# Patient Record
Sex: Female | Born: 1955 | Race: White | Hispanic: No | Smoking: Former smoker
Health system: Southern US, Community
[De-identification: ages and names within clinical notes are randomized; demographics above are authoritative.]

## PROBLEM LIST (undated history)

## (undated) DIAGNOSIS — N920 Excessive and frequent menstruation with regular cycle: Secondary | ICD-10-CM

## (undated) DIAGNOSIS — F32A Depression, unspecified: Secondary | ICD-10-CM

## (undated) DIAGNOSIS — K219 Gastro-esophageal reflux disease without esophagitis: Secondary | ICD-10-CM

## (undated) DIAGNOSIS — I10 Essential (primary) hypertension: Secondary | ICD-10-CM

## (undated) DIAGNOSIS — K7460 Unspecified cirrhosis of liver: Secondary | ICD-10-CM

## (undated) DIAGNOSIS — F329 Major depressive disorder, single episode, unspecified: Secondary | ICD-10-CM

## (undated) DIAGNOSIS — B1920 Unspecified viral hepatitis C without hepatic coma: Secondary | ICD-10-CM

## (undated) DIAGNOSIS — F1020 Alcohol dependence, uncomplicated: Secondary | ICD-10-CM

## (undated) HISTORY — DX: Alcohol dependence, uncomplicated: F10.20

## (undated) HISTORY — DX: Unspecified cirrhosis of liver: K74.60

## (undated) HISTORY — DX: Gastro-esophageal reflux disease without esophagitis: K21.9

---
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
## 2006-11-08 ENCOUNTER — Ambulatory Visit: Payer: Self-pay | Admitting: Internal Medicine

## 2006-11-08 DIAGNOSIS — N920 Excessive and frequent menstruation with regular cycle: Secondary | ICD-10-CM

## 2006-11-08 HISTORY — DX: Excessive and frequent menstruation with regular cycle: N92.0

## 2006-11-14 ENCOUNTER — Ambulatory Visit: Payer: Self-pay | Admitting: Gynecology

## 2006-11-14 ENCOUNTER — Encounter (INDEPENDENT_AMBULATORY_CARE_PROVIDER_SITE_OTHER): Payer: Self-pay | Admitting: Internal Medicine

## 2006-11-17 ENCOUNTER — Ambulatory Visit: Payer: Self-pay | Admitting: Obstetrics & Gynecology

## 2006-11-21 ENCOUNTER — Encounter: Admission: RE | Admit: 2006-11-21 | Discharge: 2006-11-21 | Payer: Self-pay | Admitting: Obstetrics & Gynecology

## 2006-12-07 ENCOUNTER — Encounter (INDEPENDENT_AMBULATORY_CARE_PROVIDER_SITE_OTHER): Payer: Self-pay | Admitting: Internal Medicine

## 2006-12-08 ENCOUNTER — Ambulatory Visit: Payer: Self-pay | Admitting: Obstetrics & Gynecology

## 2006-12-20 ENCOUNTER — Ambulatory Visit: Payer: Self-pay | Admitting: Family Medicine

## 2006-12-20 DIAGNOSIS — D696 Thrombocytopenia, unspecified: Secondary | ICD-10-CM

## 2006-12-22 ENCOUNTER — Ambulatory Visit: Payer: Self-pay | Admitting: Obstetrics & Gynecology

## 2006-12-22 LAB — CONVERTED CEMR LAB
Basophils Absolute: 0 10*3/uL (ref 0.0–0.1)
Basophils Relative: 0.4 % (ref 0.0–1.0)
Eosinophils Absolute: 0.1 10*3/uL (ref 0.0–0.6)
Eosinophils Relative: 1.9 % (ref 0.0–5.0)
Lymphocytes Relative: 40.1 % (ref 12.0–46.0)
Monocytes Absolute: 0.6 10*3/uL (ref 0.2–0.7)
Monocytes Relative: 9.7 % (ref 3.0–11.0)
Neutro Abs: 2.8 10*3/uL (ref 1.4–7.7)
Platelets: 90 10*3/uL — ABNORMAL LOW (ref 150–400)
RBC: 3.81 M/uL — ABNORMAL LOW (ref 3.87–5.11)
WBC: 5.8 10*3/uL (ref 4.5–10.5)

## 2006-12-23 ENCOUNTER — Ambulatory Visit: Payer: Self-pay | Admitting: Family Medicine

## 2006-12-23 DIAGNOSIS — R945 Abnormal results of liver function studies: Secondary | ICD-10-CM

## 2007-01-03 ENCOUNTER — Encounter (INDEPENDENT_AMBULATORY_CARE_PROVIDER_SITE_OTHER): Payer: Self-pay | Admitting: Internal Medicine

## 2007-01-03 LAB — CONVERTED CEMR LAB
ALT: 305 units/L — ABNORMAL HIGH (ref 0–35)
Albumin: 3.8 g/dL (ref 3.5–5.2)
Alkaline Phosphatase: 78 units/L (ref 39–117)
Total Protein: 6.8 g/dL (ref 6.0–8.3)

## 2008-08-20 ENCOUNTER — Emergency Department: Payer: Self-pay | Admitting: Internal Medicine

## 2008-11-27 ENCOUNTER — Emergency Department (HOSPITAL_COMMUNITY): Admission: EM | Admit: 2008-11-27 | Discharge: 2008-11-27 | Payer: Self-pay | Admitting: Emergency Medicine

## 2009-03-01 DIAGNOSIS — B1920 Unspecified viral hepatitis C without hepatic coma: Secondary | ICD-10-CM

## 2009-03-01 HISTORY — DX: Unspecified viral hepatitis C without hepatic coma: B19.20

## 2009-04-16 ENCOUNTER — Inpatient Hospital Stay: Payer: Self-pay | Admitting: Internal Medicine

## 2009-06-09 ENCOUNTER — Emergency Department: Payer: Self-pay | Admitting: Unknown Physician Specialty

## 2009-11-01 ENCOUNTER — Emergency Department: Payer: Self-pay | Admitting: Internal Medicine

## 2009-12-18 ENCOUNTER — Observation Stay: Payer: Self-pay | Admitting: Gastroenterology

## 2009-12-30 ENCOUNTER — Ambulatory Visit: Payer: Self-pay | Admitting: Internal Medicine

## 2010-01-06 ENCOUNTER — Inpatient Hospital Stay: Payer: Self-pay | Admitting: Gastroenterology

## 2010-01-29 ENCOUNTER — Ambulatory Visit: Payer: Self-pay | Admitting: Internal Medicine

## 2010-07-14 NOTE — Assessment & Plan Note (Signed)
NAMEMAGGI, HERSHKOWITZ                ACCOUNT NO.:  [id number]   MEDICAL RECORD NO.:  [id number]          PATIENT TYPE:  POB   LOCATION:  [facility] at [facility]         FACILITY:  [facility]   PHYSICIAN:  Elsie Lincoln, MD      DATE OF BIRTH:  08-23-1955   DATE OF SERVICE:  11/17/2006                                  CLINIC NOTE   The patient is a 55 year old female who presents for endometrial biopsy.  The patient was seen 3 days earlier.  Of note, she is not anemic but she  is known to have thrombocytopenia of a __________  with 107.  We are  going to repeat the CBC today to make sure that this is not a lab error.  If she is thrombocytopenic, I am going referral her back to [place] to  Dr. __________  so he can workup her thrombocytopenia.  We did her  endometrial biopsy today without problem, after her UCG is negative.  Her uterus sounded to approximately 10 cm.  Her TSH, also of note, is  negative.  She has a transvaginal ultrasound next week and also a  mammogram next week.  She will come back early October to review results  and plan of care.           ______________________________  Elsie Lincoln, MD     KL/MEDQ  D:  11/17/2006  T:  11/17/2006  Job:  [id number]

## 2010-07-14 NOTE — Assessment & Plan Note (Signed)
Emily Butler, Emily Butler                ACCOUNT NO.:  [id number]   MEDICAL RECORD NO.:  [id number]          PATIENT TYPE:  POB   LOCATION:  [facility] at [facility]         FACILITY:  [facility]   PHYSICIAN:  Elsie Lincoln, MD      DATE OF BIRTH:  07/24/1955   DATE OF SERVICE:  12/22/2006                                  CLINIC NOTE   The patient is a 55 year old female who presents for IUD insertion,  supposed to come last week; however, she was having what probably was  alcohol withdrawal symptoms, as she was very shaky, she was supposed to  go the emergency room, but she did not.  She states that she quit taking  the Antabuse and Wellbutrin, because it seems that it was making her  feel worse, but she swears she has not had a drink in 2 weeks and feels  much better.  Her outlook is much better; she does not feel suicidal,  and she feels that she is through the worst of it.  She has not gone  through an [employer]; however, she does know where they are, and she  intends to go.  I did do some labs and her PT was 16.2, which is high;  her PTT was normal; her AST was 229; her ALT was 208.  The patient was  sent to primary care doctor and they are sending her probably to a  hematologist.  She has consented for the IUD, and the UPT was negative.  Her uterus sounded to 8 cm; however, when we tried to get the device in,  I can only get it to 6 cm and it would not launch.  The patient felt  like it was coming out her lungs, so we aborted the procedure.  We  talked about the __________ ; this may help some of her menorrhagia;  however, she does have a hemoglobin of 13.9.  I figure as soon as she  gets her coagulopathy corrected, she probably will not problems with  menorrhagia anymore.  However, with the __________ would be a good  bridge.  The patient will come back in 1 week for an __________  insertion.           ______________________________  Elsie Lincoln, MD     KL/MEDQ  D:  12/22/2006  T:   12/23/2006  Job:  [id number]

## 2010-07-14 NOTE — Assessment & Plan Note (Signed)
Emily Butler, Emily Butler                ACCOUNT NO.:  [id number]   MEDICAL RECORD NO.:  [id number]          PATIENT TYPE:  POB   LOCATION:  [facility] at [facility]         FACILITY:  [facility]   PHYSICIAN:  Elsie Lincoln, MD      DATE OF BIRTH:  Aug 05, 1955   DATE OF SERVICE:  12/08/2006                                  CLINIC NOTE   The patient is a  55 year old female who presents for follow-up of  abnormal uterine bleeding. The patient had endometrial biopsy which  showed proliferative endometrium, which is interesting because she did  go through a time period where she had no menses for about a the year.  However, it does not appear that she is in menopause now. She is 55  years old and most likely will not have many more years before menopause  and is upon her.  She did have an ultrasound which showed a uterus that  measures 10.3 x 5.3 x 6.6  with an endometrial stripe of  0.9 cm.  This  is not enlarged.  There was a shadowing in the uterine fundus, which is  most likely calcified fibroid. The right ovary showed a right simple  cyst and  a hypoechoic  nodule on the left ovary which is most likely a  hemorrhagic cyst.  It measures approximately 2.7 cm.  We will follow  this up with another ultrasound in 8 weeks.  We did start speaking in  detail once I told her that her platelets were abnormal and she could be  possibly due to her drinking and she opened up and let me know that she  is an alcoholic and has suicidal thoughts and was having that something  could be wrong with her that could wrong with her that could possibly  lead to her death. She had been on Zoloft in the past, which she thought  made her have suicidal thoughts so I do not think that this would be a  good option for her. She was drinking a lot of alcohol prior to her  children had come to live with her and now she is down to three to four  drinks a day, which I still think is a level of alcoholism. She can go  out to up a week  without drinking.  However, it does sound like she  still drinks every day.  We will check coags and liver function to see  if she is having any problems with end organ damage from her alcoholism.  The patient is requesting Antabuse to help her quit drinking, although I  am not very familiar with this drug and  she understands this.  I will  have consulted the pharmacy at [facility] and we will prescribe her  this. She agrees to go to Alcoholics Anonymous and take her  antidepressant. She is not suicidal at this point.  I do believe she  needs a psychiatrist as this is coming out of the realm of a  gynecologist.  I do feel like we need to help her today as she has  reached out to her primary care physician in the past and he  has refused  to help her.  She does need a sleep aid and we will prescribe Ambien for  that.  I did prescribe her narcotic in the past, but she did not ask a  refill of that and that is refreshing.  Her platelet count was down to  95, so we are drawing coags  and liver function tests day and will refer  her back to [place] to have her thrombocytopenia worked up more  thoroughly by a medicine doctor.  Good news is that her mammogram is  normal.  As noted above, we will follow up her ultrasound in 8 weeks to  make sure that hemorrhagic cyst has resolved and that it is not  neoplasm.  We will also put in the Mirena IUD to help her vaginal  bleeding.  We discussed OCPs and Depo; however, due to her migraine  headaches, she does not want to try either of these. Other surgical  options were discussed including endometrial ablation and hysterectomy.  However, we will start with simple measures with the least risk which is  the IUD insertion.  The patient to come back in one month for IUD  insertion.  The patient is not sexually active so pregnancy is not an  issue at this point. Again, if the patient has any suicidal ideation,  she is supposed to go to the emergency room  immediately          ______________________________  Elsie Lincoln, MD    KL/MEDQ  D:  12/08/2006  T:  12/08/2006  Job:  [id number]

## 2010-07-14 NOTE — Assessment & Plan Note (Signed)
Emily Butler, Emily Butler                ACCOUNT NO.:  [id number]   MEDICAL RECORD NO.:  [id number]          PATIENT TYPE:  POB   LOCATION:  [facility] at [facility]         FACILITY:  [facility]   PHYSICIAN:  Elsie Lincoln, MD      DATE OF BIRTH:  Jul 20, 1955   DATE OF SERVICE:  11/14/2006                                  CLINIC NOTE   REASON FOR VISIT:  The patient is 55 year old, P3 female who was sent to  [place] for irregular bleeding that started about 3 years ago when she saw a  gynecologist in [place] who did an endometrial biopsy which was negative  and then eventually went away by itself and she had no menses for a  year.  After that, the irregular menses, pain and heavy bleeding  returned.  She has never had transvaginal ultrasound.  She does not know  if she has cysts or fibroids of her uterus.  She goes through several  pads and she takes a bath every night to soothe her back pain.  It has  not caused her to miss work, but she sometimes feels like she needs to.  It definitely interferes with her life.   PAST MEDICAL HISTORY:  Migraine headaches.   PAST SURGICAL HISTORY:  1. C-section x3 with the birth of twins with one pregnancy.  2. BTL.   MEDICATIONS:  Motrin and multivitamin.   ALLERGIES:  CODEINE which causes her to vomit.   FAMILY HISTORY:  Father has heart disease, heart attack and high blood  pressure.   PAST GYNECOLOGICAL HISTORY:  No history of abnormal Pap smears, ovarian  cysts or fibroid tumors of the uterus.   SOCIAL HISTORY:  She drinks two drinks 5 days for week.  Does not smoke.  She does drink three caffeinated beverages a day.   REVIEW OF SYSTEMS:  Positive for bruising, muscle aches, fatigue,  frequent headaches, vaginal bleeding and vaginal odor.   PHYSICAL EXAMINATION:  GENERAL:  Well-nourished, well-developed in no  apparent distress.  VITAL SIGNS:  Blood pressures 136/79, weight 166, pulse 70.  ABDOMEN:  Soft, nontender.  No rebound or guarding.  GENITALIA:   Tanner V.  Vagina is pink, normal rugae with small amount of  blood.  Cervix nulliparous appearing, small and mobile.  Uterus enlarged  about 14 weeks.  Adnexa with no masses, nontender.  Uterus is tender  when it is moved.  EXTREMITIES:  Nontender.   ASSESSMENT:  A 55 year old female with menorrhagia, pelvic pain and  enlarged uterus.   PLAN:  1. CBC to rule out anemia.  2. TSH.  I think this is the reason she has menorrhagia.  3. Transvaginal ultrasound.  4. Endometrial biopsy premedicated with Motrin.  5. The patient needs a screening mammogram.  She has never had one.  6. Return to clinic for the endometrial biopsy.  7. Vicodin for pain.           ______________________________  Elsie Lincoln, MD     KL/MEDQ  D:  11/14/2006  T:  11/15/2006  Job:  [id number]   cc:   /Dr. Jari Favre Court E

## 2010-08-11 ENCOUNTER — Other Ambulatory Visit: Payer: Self-pay | Admitting: Medical

## 2011-04-18 ENCOUNTER — Emergency Department: Payer: Self-pay | Admitting: Emergency Medicine

## 2011-04-18 LAB — COMPREHENSIVE METABOLIC PANEL
Albumin: 3.7 g/dL (ref 3.4–5.0)
Alkaline Phosphatase: 90 U/L (ref 50–136)
BUN: 38 mg/dL — ABNORMAL HIGH (ref 7–18)
Calcium, Total: 8.4 mg/dL — ABNORMAL LOW (ref 8.5–10.1)
Chloride: 113 mmol/L — ABNORMAL HIGH (ref 98–107)
Co2: 23 mmol/L (ref 21–32)
EGFR (African American): >60
Glucose: 92 mg/dL (ref 65–99)
SGOT(AST): 166 U/L — ABNORMAL HIGH (ref 15–37)
SGPT (ALT): 141 U/L — ABNORMAL HIGH
Sodium: 145 mmol/L (ref 136–145)
Total Protein: 5.9 g/dL — ABNORMAL LOW (ref 6.4–8.2)

## 2011-04-18 LAB — URINALYSIS, COMPLETE
Bilirubin,UR: NEGATIVE
Hyaline Cast: 11
Ketone: NEGATIVE
Ph: 5 (ref 4.5–8.0)
Protein: 100
RBC,UR: 84 /HPF (ref 0–5)
Specific Gravity: 1.016 (ref 1.003–1.030)
Squamous Epithelial: 1

## 2011-04-18 LAB — LIPASE, BLOOD: Lipase: 230 U/L (ref 73–393)

## 2011-04-18 LAB — CBC
MCH: 34.4 pg — ABNORMAL HIGH (ref 26.0–34.0)
MCHC: 34.7 g/dL (ref 32.0–36.0)
MCV: 99 fL (ref 80–100)
RBC: 3.05 10*6/uL — ABNORMAL LOW (ref 3.80–5.20)
RDW: 14.1 % (ref 11.5–14.5)

## 2011-12-08 ENCOUNTER — Emergency Department: Payer: Self-pay

## 2011-12-08 LAB — URINALYSIS, COMPLETE
Bacteria: NONE SEEN
Bilirubin,UR: NEGATIVE
Glucose,UR: NEGATIVE mg/dL (ref 0–75)
Ketone: NEGATIVE
Leukocyte Esterase: NEGATIVE
Nitrite: NEGATIVE
Ph: 5 (ref 4.5–8.0)
RBC,UR: 34 /HPF (ref 0–5)
Squamous Epithelial: NONE SEEN
WBC UR: <1 /HPF (ref 0–5)

## 2011-12-08 LAB — CBC
HCT: 34.4 % — ABNORMAL LOW (ref 35.0–47.0)
HGB: 12.5 g/dL (ref 12.0–16.0)
MCH: 34.3 pg — ABNORMAL HIGH (ref 26.0–34.0)
MCHC: 36.2 g/dL — ABNORMAL HIGH (ref 32.0–36.0)

## 2011-12-08 LAB — COMPREHENSIVE METABOLIC PANEL
Albumin: 4.4 g/dL (ref 3.4–5.0)
Alkaline Phosphatase: 118 U/L (ref 50–136)
Calcium, Total: 9.2 mg/dL (ref 8.5–10.1)
Glucose: 96 mg/dL (ref 65–99)
SGOT(AST): 275 U/L — ABNORMAL HIGH (ref 15–37)
SGPT (ALT): 242 U/L — ABNORMAL HIGH (ref 12–78)
Sodium: 136 mmol/L (ref 136–145)

## 2011-12-13 LAB — POTASSIUM: Potassium: 5.2 mmol/L — ABNORMAL HIGH (ref 3.5–5.1)

## 2012-02-03 ENCOUNTER — Inpatient Hospital Stay: Payer: Self-pay | Admitting: Family Medicine

## 2012-02-03 LAB — COMPREHENSIVE METABOLIC PANEL
Albumin: 3.7 g/dL (ref 3.4–5.0)
Alkaline Phosphatase: 124 U/L (ref 50–136)
BUN: 33 mg/dL — ABNORMAL HIGH (ref 7–18)
Bilirubin,Total: 0.9 mg/dL (ref 0.2–1.0)
Chloride: 112 mmol/L — ABNORMAL HIGH (ref 98–107)
EGFR (African American): 58 — ABNORMAL LOW
Glucose: 98 mg/dL (ref 65–99)
SGOT(AST): 88 U/L — ABNORMAL HIGH (ref 15–37)
SGPT (ALT): 57 U/L (ref 12–78)
Total Protein: 6.5 g/dL (ref 6.4–8.2)

## 2012-02-03 LAB — LIPASE, BLOOD: Lipase: 308 U/L (ref 73–393)

## 2012-02-03 LAB — APTT: Activated PTT: 27.5 secs (ref 23.6–35.9)

## 2012-02-03 LAB — CBC WITH DIFFERENTIAL/PLATELET
Basophil #: 0 10*3/uL (ref 0.0–0.1)
Basophil %: 1 %
Eosinophil #: 0 10*3/uL (ref 0.0–0.7)
Eosinophil %: 1.3 %
HCT: 27.3 % — ABNORMAL LOW (ref 35.0–47.0)
HGB: 9.6 g/dL — ABNORMAL LOW (ref 12.0–16.0)
Lymphocyte #: 0.5 10*3/uL — ABNORMAL LOW (ref 1.0–3.6)
Lymphocyte %: 15.3 %
MCH: 33.5 pg (ref 26.0–34.0)
MCHC: 35.3 g/dL (ref 32.0–36.0)
Monocyte #: 0.2 x10 3/mm (ref 0.2–0.9)
Neutrophil #: 2.6 10*3/uL (ref 1.4–6.5)
RBC: 2.87 10*6/uL — ABNORMAL LOW (ref 3.80–5.20)
RDW: 13.1 % (ref 11.5–14.5)

## 2012-02-03 LAB — URINALYSIS, COMPLETE
Bilirubin,UR: NEGATIVE
Glucose,UR: NEGATIVE mg/dL (ref 0–75)
Hyaline Cast: 42
Leukocyte Esterase: NEGATIVE
Nitrite: NEGATIVE
Ph: 5 (ref 4.5–8.0)
RBC,UR: 69 /HPF (ref 0–5)
Squamous Epithelial: <1

## 2012-02-03 LAB — PROTIME-INR: INR: 1.2

## 2012-02-04 LAB — CBC WITH DIFFERENTIAL/PLATELET
Basophil #: 0 10*3/uL (ref 0.0–0.1)
Basophil %: 0.6 %
Eosinophil #: 0.1 10*3/uL (ref 0.0–0.7)
Eosinophil %: 2.3 %
HCT: 24.4 % — ABNORMAL LOW (ref 35.0–47.0)
HGB: 8.5 g/dL — ABNORMAL LOW (ref 12.0–16.0)
Lymphocyte #: 0.5 10*3/uL — ABNORMAL LOW (ref 1.0–3.6)
MCH: 32.9 pg (ref 26.0–34.0)
MCHC: 34.8 g/dL (ref 32.0–36.0)
MCV: 95 fL (ref 80–100)
Monocyte %: 8.7 %
Neutrophil %: 65.4 %
Platelet: 48 10*3/uL — ABNORMAL LOW (ref 150–440)
RBC: 2.58 10*6/uL — ABNORMAL LOW (ref 3.80–5.20)

## 2012-02-04 LAB — LIPID PANEL
Cholesterol: 112 mg/dL (ref 0–200)
HDL Cholesterol: 35 mg/dL — ABNORMAL LOW (ref 40–60)
Triglycerides: 93 mg/dL (ref 0–200)
VLDL Cholesterol, Calc: 19 mg/dL (ref 5–40)

## 2012-02-04 LAB — BASIC METABOLIC PANEL
Anion Gap: 6 — ABNORMAL LOW (ref 7–16)
Co2: 23 mmol/L (ref 21–32)
Creatinine: 1.2 mg/dL (ref 0.60–1.30)
EGFR (African American): 59 — ABNORMAL LOW
EGFR (Non-African Amer.): 51 — ABNORMAL LOW
Potassium: 4.7 mmol/L (ref 3.5–5.1)
Sodium: 141 mmol/L (ref 136–145)

## 2012-02-09 LAB — CULTURE, BLOOD (SINGLE)

## 2012-07-07 DIAGNOSIS — Z87891 Personal history of nicotine dependence: Secondary | ICD-10-CM | POA: Insufficient documentation

## 2012-07-07 DIAGNOSIS — I10 Essential (primary) hypertension: Secondary | ICD-10-CM | POA: Insufficient documentation

## 2012-07-07 DIAGNOSIS — K7460 Unspecified cirrhosis of liver: Secondary | ICD-10-CM | POA: Insufficient documentation

## 2012-07-07 DIAGNOSIS — I4891 Unspecified atrial fibrillation: Secondary | ICD-10-CM | POA: Insufficient documentation

## 2012-07-07 DIAGNOSIS — B182 Chronic viral hepatitis C: Secondary | ICD-10-CM | POA: Insufficient documentation

## 2012-10-31 DIAGNOSIS — K429 Umbilical hernia without obstruction or gangrene: Secondary | ICD-10-CM | POA: Insufficient documentation

## 2013-05-23 DIAGNOSIS — G2581 Restless legs syndrome: Secondary | ICD-10-CM | POA: Insufficient documentation

## 2013-08-13 DIAGNOSIS — K219 Gastro-esophageal reflux disease without esophagitis: Secondary | ICD-10-CM | POA: Insufficient documentation

## 2013-08-13 DIAGNOSIS — N189 Chronic kidney disease, unspecified: Secondary | ICD-10-CM | POA: Insufficient documentation

## 2014-06-18 NOTE — Consult Note (Signed)
Brief Consult Note: Diagnosis: Abdominal pai, ascites and diarrhea.   Patient was seen by consultant.   Consult note dictated.   Comments: Patient with cirrhosis of liver along with h/o ETOH and hepatitis C admitted with abdominal pain which is intermittent for last several months. Also c/o diarrhea for 2 weeks. CT with ascites and cirrhosis. US not enough fluid to perform ascites. Abdomen is benign but with hyperactive bowel sounds. ? RLL pulmonary infiltrate.  Recommendations: Agree with Levaquin. Stool studies if diarrhea continues. Continue diuretics. Agree with Nadolol. Hopefully home soon on PO antibiotics with OP follow up with me.  Electronic Signatures: Lurline DelIftikhar, Yuuki Skeens (MD)  (Signed 05-Dec-13 17:52)  Authored: Brief Consult Note   Last Updated: 05-Dec-13 17:52 by Lurline DelIftikhar, Drea Jurewicz (MD)

## 2014-06-18 NOTE — Consult Note (Signed)
PATIENT NAME:  Emily Butler, Emily Butler MR#:  [id number] DATE OF BIRTH:  October 19, 1955  DATE OF CONSULTATION:  02/03/2012  REFERRING PHYSICIAN:   CONSULTING PHYSICIAN:  Lurline DelShaukat Jaime Grizzell, MD  REASON FOR CONSULTATION: Abdominal pain.   HISTORY OF PRESENT ILLNESS: This is a 59 year old female with history of hepatitis C, history of alcohol use in the past, cirrhosis of liver, and ascites. The patient has been having intermittent abdominal pain for the last several months, according to her. She was admitted again as she started to have severe abdominal pain that started yesterday. Sometimes it feels like gas pain, which is diffuse, and at other times it is mostly in the right upper to right lower quadrant area. She is also complaining of diarrhea for the last two weeks, about four to five loose bowel movements a day. No nausea or vomiting. No fever. CT scan of the abdomen and pelvis done in the Emergency Room was quite unremarkable, except for mild to moderate ascites and a cirrhotic appearing liver. The patient was admitted by Dr. Renae GlossWieting for further management. CT scan also showed questionable right lower lobe airspace disease raising concerns about possible pneumonia.   PAST MEDICAL HISTORY:  1. Hepatitis C and cirrhosis. 2. History of hemolytic anemia in the past. 3. Chronic ascites.  4. Atrial fibrillation.   PAST SURGICAL HISTORY: Cesarean section.   ALLERGIES: No known drug allergies.   HOME MEDICATIONS:  1. Atenolol 25 mg a day. 2. Lasix 40 mg a day. 3. Spironolactone 100 mg a day.   SOCIAL HISTORY: He does not smoke and quit drinking about three years ago.   FAMILY HISTORY: Unremarkable.   REVIEW OF SYSTEMS: Grossly unremarkable except for what is mentioned in the History of Present Illness.   PHYSICAL EXAMINATION:   GENERAL: Well built female. She does not appear to be in any acute distress. Her abdominal pain is much better now, according to her.  VITALS: Pulse 64, respirations 18, blood  pressure 138/80, and temperature 98.1. She is clinically not jaundiced.   NECK: Neck veins are flat.   PULMONARY: Lungs are clear to auscultation bilaterally with fair entry.   CARDIOVASCULAR: Regular rate and rhythm. No gallops or murmur.   ABDOMEN: Examination showed only mildly distended abdomen. No significant ascites was noted on physical examination. Bowel sounds were hyperactive. No significant hepatosplenomegaly was noted.   EXTREMITIES: No edema. No clubbing or cyanosis.   NEUROLOGIC: Examination appears to be normal and no signs of hepatic encephalopathy.   LABS/RADIOLOGIC STUDIES: Electrolytes are fine as well as BUN and creatinine. AST is 88. The rest of the liver enzymes are normal. Serum lipase is 308. White cell count is 3.3, hemoglobin 9.6, hematocrit 27.3, and platelet count 61. INR is 1.2.   Urinalysis showed 3+ blood, 3 WBCs, and 69 RBCs.  Ultrasound of the abdomen did not show enough ascites to perform paracentesis.   ASSESSMENT AND PLAN: The patient is with abdominal pain, which is intermittent, for the last several months. This may be secondary to ascites. SBP is another consideration, although the patient is afebrile and her white cell count is normal, although she does have ascites. Right lower lobe pneumonia is another consideration which can present with abdominal pain as well. The patient is also complaining of some diarrhea. She has no history of antibiotic use, although C. difficile colitis or other forms of colitis remains a possibility. The patient also has history of underlying cirrhosis secondary to combination of hepatitis C and alcohol. She has  not had any alcoholic beverage in the last three years, according to her. I would recommend treating her empirically with an antibiotic such as Levaquin, which will cover both SBP as well as suspected pneumonia. Levaquin may also be helpful in case she is having an infectious diarrhea. I would obtain stool studies  including fecal, WBC cultures, and C. difficile toxin. Agree with current diuretics and switching atenolol to nadolol to reduce the risk of portal hypertensive bleeding. Overall the patient's physical examination is quite benign, and if she does have SBP it appears to be mild. We will follow. The plan has been discussed with the patient.  ____________________________ Lurline Del, MD si:slb D: 02/03/2012 18:01:29 ET T: 02/04/2012 07:29:48 ET JOB#: [id number]  cc: Lurline Del, MD, <Dictator> Lurline Del MD ELECTRONICALLY SIGNED 02/07/2012 10:48

## 2014-06-18 NOTE — H&P (Signed)
PATIENT NAME:  Emily Butler, GILLSON MR#:  [id number] DATE OF BIRTH:  1955-08-16  DATE OF ADMISSION:  02/03/2012  PRIMARY CARE PHYSICIAN: [facility], Dr. Hessie Diener    CHIEF COMPLAINT: Abdominal pain.   HISTORY OF PRESENT ILLNESS: This is a 59 year old female with history of Hepatitis C, cirrhosis, and ascites. She presents with abdominal pain. She has had abdominal pain since last February but not as bad. Over the last few days she is having abdominal pain, stabbing, knifelike, dragging down her abdomen on the right side down into the pubic bone, 9 out of 10 in intensity. It sort of comes and goes, also has a gas-like pain also. She has also had worsening abdominal distention, some chills but no fever. She also feels like she has an upper respiratory tract infection with nose congestion and some ear pain on the left. Hospitalist services were contacted for further evaluation for suspicion of SBP. A CT scan of the abdomen and pelvis showed a cirrhotic liver, moderate amount of ascites, splenomegaly, and right lower lobe airspace disease, could be atelectasis versus pneumonia.   PAST MEDICAL HISTORY:  1. Hepatitis C. 2. Cirrhosis. 3. 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 4. Ascites. 5. Atrial fibrillation in the past.   PAST SURGICAL HISTORY: Cesarean section.   ALLERGIES: No known drug allergies.   MEDICATIONS:  1. Atenolol 25 mg daily.  2. Furosemide 40 mg daily.  3. Spironolactone 100 mg daily.   Not sure if she is taking them at this point.   SOCIAL HISTORY: No smoking. Quit alcohol three years ago. No drug use currently. Used to have IV drugs back in the 80's and also did have a transfusion back in 1984. She works light work in a bar.   FAMILY HISTORY: Father died of a heart issue. Mother died of alcohol and drugs and had leukemia.   REVIEW OF SYSTEMS: CONSTITUTIONAL: No fever. Positive for chills. Positive weight loss and weight gain. Weight fluctuates. Positive for weakness.  EYES: She does wear glasses and had some left eye blurring of vision. EARS, NOSE, MOUTH, AND THROAT: Positive for runny nose, sinus congestion, cold, sore throat. CARDIOVASCULAR: No chest pain. No palpitations. RESPIRATORY: No shortness of breath. Slight cough. No sputum. No hemoptysis. GASTROINTESTINAL: Positive for abdominal pain 9 out of 10 in intensity. Positive for diarrhea 4 to 5 times but not much coming out. No nausea. No vomiting. GENITOURINARY: Positive for blood in the urine. No hematuria. MUSCULOSKELETAL: Positive for joint pain. INTEGUMENTARY: Positive for rash on the back. MUSCULOSKELETAL: Positive for joint pain. NEUROLOGIC: No fainting or blackouts. PSYCHIATRIC: History of depression in the past. ENDOCRINE: No thyroid problems. HEMATOLOGIC/LYMPHATIC: Positive for anemia.   PHYSICAL EXAMINATION:   VITAL SIGNS: Temperature 98, pulse 62, respirations 18, blood pressure 164/68, pulse oximetry 98%.   GENERAL: No respiratory distress.   EYES: Conjunctivae and lids normal. Pupils equal, round, and reactive to light. Extraocular muscles intact. No nystagmus.   EARS, NOSE, MOUTH, AND THROAT: Tympanic membranes on the left erythematous. Tympanic membrane on the right negative. Nasal mucosa slight erythema. Throat slight erythema. No exudate seen. Lips and gums no lesions.   NECK: No JVD. No bruits. No lymphadenopathy. No thyromegaly. No thyroid nodules palpated.   RESPIRATORY: Decreased breath sounds bilateral bases. Scattered rhonchi. No wheeze.   CARDIOVASCULAR: S1, S2 normal. +2/6 systolic ejection murmur. Carotid upstroke 2+ bilaterally. No bruits. Dorsalis pedis pulses 2+ bilaterally. No edema of the lower extremity.   ABDOMEN: Soft. Positive tenderness more on the  right abdomen than the left side. Positive for hepatosplenomegaly. Positive umbilical hernia.   LYMPHATIC: No lymph nodes in the neck.   MUSCULOSKELETAL: No clubbing. No cyanosis. No edema.   SKIN: No ulcers seen.    NEUROLOGIC: Cranial nerves II through XII grossly intact. Deep tendon reflexes 2+ bilateral lower extremity.   PSYCHIATRIC: The patient is oriented to person, place, and time.   LABORATORY AND RADIOLOGICAL DATA: EKG sinus bradycardia, 55 beats per minute, no acute ST-T wave changes.   Chest x-ray showed minimal right basilar opacities, atelectasis versus early infection.   CT scan of the abdomen and pelvis showed cirrhotic liver with moderate amount of ascites, splenomegaly, right lower lobe hazy airspace disease which may be atelectasis versus pneumonia.   Urinalysis 3+ blood. White blood cell count 3.3, hemoglobin and hematocrit 9.6 and 27.3, platelet count 61, glucose 98, BUN 33, creatinine 1.22, sodium 140, potassium 4.5, chloride 112, CO2 21, calcium 8.7. Liver function tests AST slightly elevated at 88. GFR 50. Lipase 308. Troponin negative.   ASSESSMENT AND PLAN:  1. Abdominal pain, acute on chronic. Will rule out SBP with her ascites. A PT/INR was not drawn on presentation, awaiting that prior to paracentesis. Paracentesis labs ordered by ER physician. Will wait for paracentesis before starting IV Levaquin high dose.  2. Upper respiratory tract infection, questionable pneumonia. This could be secondary to the patient's ascites tracking up on the right lower lung, unclear if it is pneumonia but she does have an upper respiratory tract infection. Also, Levaquin will cover.  3. 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 Watch blood counts closely. Will get a paracentesis to rule out SBP. Continue Lasix and Aldactone for ascites. Will switch the patient's atenolol over to nadolol to lower portal pressures. Will get a GI consultation since the patient has not had an alcoholic beverage in three years and drugs in numerous years. Could be a candidate for a liver if referred to a tertiary care center but will have GI evaluate here first.  4. Chronic kidney disease. Continue  to watch GFR with diuresis. Last creatinine on record back in October was 1.37.   TIME SPENT ON ADMISSION: 55 minutes.   ____________________________ Herschell Dimesichard J. Renae GlossWieting, MD rjw:drc D: 02/03/2012 14:49:28 ET T: 02/03/2012 14:59:55 ET JOB#: [id number]  cc: Herschell Dimesichard J. Renae GlossWieting, MD, <Dictator> [facility] Salley ScarletICHARD J Peniel Hass MD ELECTRONICALLY SIGNED 02/03/2012 17:17

## 2014-06-18 NOTE — Consult Note (Signed)
Chief Complaint:   Subjective/Chief Complaint Feels well. No symptoms. No diarrhea.   VITAL SIGNS/ANCILLARY NOTES: **Vital Signs.:   06-Dec-13 13:11   Vital Signs Type Routine   Temperature Temperature (F) 97.6   Celsius 36.4   Temperature Source oral   Pulse Pulse 60   Respirations Respirations 18   Systolic BP Systolic BP 121   Diastolic BP (mmHg) Diastolic BP (mmHg) 71   Mean BP 87   Pulse Ox % Pulse Ox % 98   Pulse Ox Activity Level  At rest   Oxygen Delivery Room Air/ 21 %   Brief Assessment:   Additional Physical Exam Abdomen is soft and non tender. No significant ascites.   Assessment/Plan:  Assessment/Plan:   Assessment Cirhosis of liver, well compensated. Ascites. ? SBP. Patient is now asymptomatic. ? Pneumonia. Diarrhea, resolved.    Plan Continue Levaquin for 7 days as OP. Continue diuretics as OP. Follow up with Dr. Bluford Kaufmannh as OP. Will sign off. Please call Dr. Mechele CollinElliott over the weekend if needed.   Electronic Signatures: Lurline DelIftikhar, Lyberti Thrush (MD)  (Signed 06-Dec-13 15:34)  Authored: Chief Complaint, VITAL SIGNS/ANCILLARY NOTES, Brief Assessment, Assessment/Plan   Last Updated: 06-Dec-13 15:34 by Lurline DelIftikhar, Quame Spratlin (MD)

## 2014-06-18 NOTE — Discharge Summary (Signed)
PATIENT NAME:  Emily Butler, Emily Butler MR#:  [id number] DATE OF BIRTH:  07-Aug-1955  DATE OF ADMISSION:  02/03/2012 DATE OF DISCHARGE:  02/05/2012  DISPOSITION: Home.   REASON FOR ADMISSION: Abdominal pain.  CONSULTANTS:  Dr. Niel HummerIftikhar, GI   DISCHARGE MEDICATIONS: 1. Furosemide 40 mg once daily.  2. Spironolactone 100 mg once daily.  3. Acetaminophen with hydrocodone 325 mg, 1 to 2 times a day as needed for pain.  4. Propranolol 20 mg twice daily.  5. Levaquin 500 mg once daily for seven days. Stop taking atenolol.   FOLLOWUP:  Follow up with primary care physician in one to two weeks and with Dr. Niel HummerIftikhar in one to two weeks.  ADMISSION DIAGNOSIS: Abdominal pain.  DISCHARGE DIAGNOSES:  1. Abdominal pain, likely due to SBP. 2. Upper respiratory tract infection, now resolved.  3. Cirrhosis secondary to hepatitis C.  4. Ascites.  5. Pancytopenia.  6. Splenomegaly.  7. Diarrhea, now resolved.   IMPORTANT LABORATORY DATA: Creatinine 1.2, BUN 28. Electrolytes are otherwise within normal limits. White count 2.3, hemoglobin of 8.5. Platelets 48,000. PT of 15.6, INR 1.2. Urine culture: No growth times two.  Blood culture: No growth times two. Urine: No signs of infection.  Sinus bradycardia on EKG.   HOSPITAL COURSE:  [name] is a very nice 59 year old female with history of hepatitis C, alcohol use in the past, and cirrhosis of the liver with ascites who has been complaining of abdominal pain intermittently for the past several months.  The patient was admitted due to severe abdominal pain that had started a day before admission, with severe right upper and right lower pain for the most part. The patient had some diarrhea for two days but it had resolved by the time the patient was admitted. She did not have any other symptoms. She had a CT scan of the abdomen that showed a right lower lobe airspace disease; this could be related to pneumonia versus atelectasis due to her pain. She also has been  diagnosed in the past with atrial fibrillation but it has resolved at this moment. The patient was admitted and since she did not have significant ascites it was not a good indication for paracentesis, which was avoided. Dr. Niel HummerIftikhar evaluated the patient and determined that the patient will be treated for SBP regardless of the lack of cultures. She is being discharged on Levaquin.   Her diarrhea resolved and C. difficile was a possibility, although the patient is actually better now. Due to her cirrhosis with portal hypertension and hepatitis, she is being prescribed some nadolol instead of her atenolol due to better prevention of portal hypertension, but she cannot afford it because she does not have insurance, since this medication is going to be over  $100 a month. Propranolol is the second-line peripheral effect beta blocker; we are going to prescribe that. It only costs four dollars and she can afford it now. The patient is discharged on the following medications: propanol, furosemide, spironolactone, and Levaquin, as mentioned above.   DISPOSITION: The patient is sent home in good condition.   TIME SPENT: 35 minutes.  ____________________________ Felipa Furnaceoberto Sanchez Gutierrez, MD rsg:bjt D: 02/05/2012 15:03:16 ET T: 02/06/2012 11:37:37 ET JOB#: [id number]  cc: Felipa Furnaceoberto Sanchez Gutierrez, MD, <Dictator> Graylee Arutyunyan Juanda ChanceSANCHEZ GUTIERRE MD ELECTRONICALLY SIGNED 02/13/2012 14:05

## 2014-11-06 DIAGNOSIS — D631 Anemia in chronic kidney disease: Secondary | ICD-10-CM | POA: Insufficient documentation

## 2014-11-06 DIAGNOSIS — R7301 Impaired fasting glucose: Secondary | ICD-10-CM | POA: Insufficient documentation

## 2018-06-28 ENCOUNTER — Emergency Department: Payer: Medicare Other

## 2018-06-28 ENCOUNTER — Emergency Department
Admission: EM | Admit: 2018-06-28 | Discharge: 2018-06-28 | Disposition: A | Discharge status: Home / Self Care | Payer: Medicare Other | Attending: Emergency Medicine | Admitting: Emergency Medicine

## 2018-06-28 ENCOUNTER — Other Ambulatory Visit: Payer: Self-pay

## 2018-06-28 DIAGNOSIS — F331 Major depressive disorder, recurrent, moderate: Secondary | ICD-10-CM | POA: Insufficient documentation

## 2018-06-28 DIAGNOSIS — I10 Essential (primary) hypertension: Secondary | ICD-10-CM | POA: Diagnosis not present

## 2018-06-28 DIAGNOSIS — F329 Major depressive disorder, single episode, unspecified: Secondary | ICD-10-CM

## 2018-06-28 DIAGNOSIS — R112 Nausea with vomiting, unspecified: Secondary | ICD-10-CM | POA: Insufficient documentation

## 2018-06-28 DIAGNOSIS — R1084 Generalized abdominal pain: Secondary | ICD-10-CM | POA: Diagnosis present

## 2018-06-28 DIAGNOSIS — M546 Pain in thoracic spine: Secondary | ICD-10-CM | POA: Insufficient documentation

## 2018-06-28 DIAGNOSIS — G8929 Other chronic pain: Secondary | ICD-10-CM

## 2018-06-28 DIAGNOSIS — R197 Diarrhea, unspecified: Secondary | ICD-10-CM | POA: Diagnosis not present

## 2018-06-28 DIAGNOSIS — F322 Major depressive disorder, single episode, severe without psychotic features: Secondary | ICD-10-CM

## 2018-06-28 HISTORY — DX: Major depressive disorder, single episode, unspecified: F32.9

## 2018-06-28 HISTORY — DX: Unspecified viral hepatitis C without hepatic coma: B19.20

## 2018-06-28 HISTORY — DX: Depression, unspecified: F32.A

## 2018-06-28 HISTORY — DX: Essential (primary) hypertension: I10

## 2018-06-28 LAB — URINALYSIS, COMPLETE (UACMP) WITH MICROSCOPIC
Bacteria, UA: NONE SEEN
Bilirubin Urine: NEGATIVE
Glucose, UA: NEGATIVE mg/dL
Ketones, ur: NEGATIVE mg/dL
Leukocytes,Ua: NEGATIVE
Nitrite: NEGATIVE
Protein, ur: NEGATIVE mg/dL
Specific Gravity, Urine: 1.009 (ref 1.005–1.030)
Squamous Epithelial / HPF: NONE SEEN (ref 0–5)
pH: 5 (ref 5.0–8.0)

## 2018-06-28 LAB — COMPREHENSIVE METABOLIC PANEL
ALT: 18 U/L (ref 0–44)
AST: 27 U/L (ref 15–41)
Albumin: 4.7 g/dL (ref 3.5–5.0)
Alkaline Phosphatase: 99 U/L (ref 38–126)
Anion gap: 11 (ref 5–15)
BUN: 30 mg/dL — ABNORMAL HIGH (ref 8–23)
CO2: 24 mmol/L (ref 22–32)
Calcium: 9.5 mg/dL (ref 8.9–10.3)
Chloride: 105 mmol/L (ref 98–111)
Creatinine, Ser: 0.99 mg/dL (ref 0.44–1.00)
GFR calc Af Amer: >60 mL/min (ref >60)
GFR calc non Af Amer: >60 mL/min (ref >60)
Glucose, Bld: 125 mg/dL — ABNORMAL HIGH (ref 70–99)
Potassium: 4.1 mmol/L (ref 3.5–5.1)
Sodium: 140 mmol/L (ref 135–145)
Total Bilirubin: 1.4 mg/dL — ABNORMAL HIGH (ref 0.3–1.2)
Total Protein: 7.1 g/dL (ref 6.5–8.1)

## 2018-06-28 LAB — CBC WITH DIFFERENTIAL/PLATELET
Abs Immature Granulocytes: 0.02 10*3/uL (ref 0.00–0.07)
Basophils Absolute: 0 10*3/uL (ref 0.0–0.1)
Basophils Relative: 1 %
Eosinophils Absolute: 0.2 10*3/uL (ref 0.0–0.5)
Eosinophils Relative: 2 %
HCT: 34.5 % — ABNORMAL LOW (ref 36.0–46.0)
Hemoglobin: 12.4 g/dL (ref 12.0–15.0)
Immature Granulocytes: 0 %
Lymphocytes Relative: 16 %
Lymphs Abs: 1.3 10*3/uL (ref 0.7–4.0)
MCH: 33 pg (ref 26.0–34.0)
MCHC: 35.9 g/dL (ref 30.0–36.0)
MCV: 91.8 fL (ref 80.0–100.0)
Monocytes Absolute: 0.7 10*3/uL (ref 0.1–1.0)
Monocytes Relative: 8 %
Neutro Abs: 6.3 10*3/uL (ref 1.7–7.7)
Neutrophils Relative %: 73 %
Platelets: 120 10*3/uL — ABNORMAL LOW (ref 150–400)
RBC: 3.76 MIL/uL — ABNORMAL LOW (ref 3.87–5.11)
RDW: 12.6 % (ref 11.5–15.5)
WBC: 8.6 10*3/uL (ref 4.0–10.5)
nRBC: 0 % (ref 0.0–0.2)

## 2018-06-28 LAB — LIPASE, BLOOD: Lipase: 34 U/L (ref 11–51)

## 2018-06-28 MED ORDER — METHOCARBAMOL 500 MG PO TABS: 500.0000 mg | ORAL_TABLET | Freq: Four times a day (QID) | ORAL | 0 refills | Status: DC

## 2018-06-28 MED ORDER — DEXAMETHASONE SODIUM PHOSPHATE 10 MG/ML IJ SOLN
10.0000 mg | Freq: Once | INTRAMUSCULAR | Status: AC
  Administered 2018-06-28: 10 mg via INTRAVENOUS
  Filled 2018-06-28: qty 1

## 2018-06-28 MED ORDER — LOPERAMIDE HCL 2 MG PO TABS: 2.0000 mg | ORAL_TABLET | Freq: Four times a day (QID) | ORAL | 1 refills | Status: DC | PRN

## 2018-06-28 MED ORDER — IOHEXOL 300 MG/ML  SOLN
100.0000 mL | Freq: Once | INTRAMUSCULAR | Status: AC | PRN
  Administered 2018-06-28: 100 mL via INTRAVENOUS
  Filled 2018-06-28: qty 100

## 2018-06-28 MED ORDER — IOHEXOL 240 MG/ML SOLN
50.0000 mL | Freq: Once | INTRAMUSCULAR | Status: AC | PRN
  Administered 2018-06-28: 20:00:00 50 mL via ORAL
  Filled 2018-06-28: qty 50

## 2018-06-28 MED ORDER — SODIUM CHLORIDE 0.9 % IV BOLUS
1000.0000 mL | Freq: Once | INTRAVENOUS | Status: AC
  Administered 2018-06-28: 20:00:00 1000 mL via INTRAVENOUS

## 2018-06-28 MED ORDER — PREDNISONE 10 MG PO TABS: 10.0000 mg | ORAL_TABLET | Freq: Every day | ORAL | 0 refills | Status: DC

## 2018-06-28 MED ORDER — ONDANSETRON 4 MG PO TBDP: 4.0000 mg | ORAL_TABLET | Freq: Three times a day (TID) | ORAL | 1 refills | Status: DC | PRN

## 2018-06-28 MED ORDER — SERTRALINE HCL 25 MG PO TABS: 25.0000 mg | ORAL_TABLET | Freq: Every day | ORAL | 3 refills | Status: DC

## 2018-06-28 NOTE — ED Notes (Signed)
Pt does say she has had some cough and diarrhea.

## 2018-06-28 NOTE — BH Assessment (Signed)
Per request of ER NP Mikki Santee), writer provided the pt. with information and instructions on how to access Outpatient Mental Health services ([facility] and [facility])   Patient denies SI/HI and AV/H.    [facility] [address],  [place], [place] [address] [phone]  [facility] [address] [place],  [place], [place] [address] [phone]  [facility] [address],  [place], [place] [address] [phone]  [facility] [address] [place], [place] [address]  [phone]  Sofie Rower, [facility] [address]  [address] [place], [place] [address] [phone]    Lin Landsman Licensed Professional Counselor, [facility], [facility]  [facility] [address]  [place], [place] [address] [phone]   Dr. Monte Fantasia Pastoral Counselor/Therapist, PsyD, MDiv, [place]  [facility], [facility] [address]  [place], [place] [address] [phone]

## 2018-06-28 NOTE — Consult Note (Signed)
[facility] Face[facility] Consult   Reason for Consult:  Depressive symptoms Referring Physician: Dr. Derrill KayGoodman Patient Identification: Emily Butler Dillion MRN:  [id number] Principal Diagnosis: <principal problem not specified> Diagnosis:  Active Problems:   MDD (major depressive disorder)   Total Time spent with patient: 1 hour  Subjective: "I hate having to rehashed my past by talking about it." Emily Butler Mccord is a 63 y.o. female patient [facility]. The patient was seen face-to-face by this provider; chart reviewed and consulted with Cuthriell PA and Dr. Toni Amendlapacs on 06/28/2018 due to the care of the patient. It was discussed with both providers that the patient does not meet criteria to be admitted to the inpatient unit.   This provider, discussed with Dr. Toni Amendlapacs that the patient can benefit from outpatient psychiatric care and therapy.  The patient was prescribed Zoloft 25 mg by mouth daily until she sees her psychiatrist. On evaluation the patient is alert and oriented x4, calm and cooperative, and mood-congruent with affect. The patient does not appear to be responding to internal or external stimuli. Neither is the patient presenting with any delusional thinking. The patient denies auditory or visual hallucinations currently, but did voice when she was on Lexapro and Wellbutrin she experience some auditory hallucinations. The patient denies any suicidal, homicidal, or self-harm ideations. The patient is not presenting with any psychotic or paranoid behaviors. During an encounter with the patient, she was able to answer questions appropriately. The patient did discuss that she tapered herself off her Lexapro and Wellbutrin. The patient stated "my PCP said she will not prescribe me my medication and wants me to see a psychiatrist."  The patient shared since being off both medication she has been experiencing increased depressive symptoms.  The patient did discuss her past suicidal  ideations and suicidal attempts many years ago.  The patient disclosed that she would like to be on one medication at this time.  She stated she was on Zoloft over 10 years ago and believed at that time it did not work for her.  She also admited to using alcohol heavily. Collateral was obtained by her daughter Denny Peon(Erin [phone]) who expresses concerns for her mom increased depressive symptoms. Denny Peonrin, discussed that she has been encouraging her mom to seek psychiatric and therapy care.  She voiced that her mom has had a rough many years since her marriage ended to her dad.  Erin disclose that she believe her mom receiving therapy will help her greatly.  She did voice that she does not think her mom is danger to herself or anyone else.  She did admit to her mom past suicide attempts and became concerned that her mom had weaned herself off her Lexapro and Wellbutrin.    Plan: the patient is not a safety risk to self or others. Patient does not need inpatient psychiatric admission for stabilization and treatment.  Per Dr. Toni Amendlapacs and this provider the patient will be prescribe Zoloft 25 mg by mouth once daily until she sees an outpatient provider. Outpatient resources will be given to the patient by Ms. Hester TTS counselor to assist her with locating services.    HPI:  Per Cuthriell, PA; Emily Butler Chopp is a 63 y.o. female who presents the emergency department for 2 complaints.  Patient reports that she is experiencing increasing generalized abdominal pain.  Patient reports that she does have a history of hepatitis C that was successfully treated with Harvoni.  Patient also reports that  she has a known hiatal hernia.  She reports that she has had increased nausea, vomiting, diarrhea.  No bilious vomit.  No hematic emesis.  No hematochezia.  Patient reports that she has significant diarrhea however.  Patient reports no fevers or chills, dysuria, polyuria, hematuria.  This is not food specific.  This is been going  for several weeks.  Patient is also reporting an increased history of depression.  Patient reports that she has a longstanding history of depression over the past 40 years.  Patient did have a suicidal attempt when she was 21 years 62old.  Patient also had contemplated and had a plan for suicide in her mid 30s.  Patient currently denies any suicidal or homicidal ideations at this time.  She does report increasing depression however as her primary care would not refill her medications until she saw psychiatry.  Patient reports that she had been successfully treated with Wellbutrin and Lexapro.  Patient reports that at her last primary care visit, she was discussing her history and the healthcare provider was not comfortable filling her medications.  Patient states that she tapered off of her Lexapro and Wellbutrin but has been having increased depression symptoms.  Again, patient denies any suicidal or homicidal ideations at this time.  She does openly discuss her mental health struggles from her late teens including previous suicidal ideations and suicide attempt.  At this time, patient is requesting psychiatric consult so that she may restart her Lexapro and Wellbutrin which she felt had managed her symptoms very well.   Past Psychiatric History:  Depression Alcohol abuse  Risk to Self:  No Risk to Others:  No Prior Inpatient Therapy:  No  Prior Outpatient Therapy:  Yes  Past Medical History:  Past Medical History:  Diagnosis Date  . Depression   . Hepatitis C 2011  . Hypertension    History reviewed. No pertinent surgical history. Family History:  Family Psychiatric  History:  Alcohol abuse Social History:  Social History   Substance and Sexual Activity  Alcohol Use Not Currently     Social History   Substance and Sexual Activity  Drug Use Never    Social History   Socioeconomic History  . Marital status: Divorced    Spouse name: Not on file  . Number of children: Not on  file  . Years of education: Not on file  . Highest education level: Not on file  Occupational History  . Not on file  Social Needs  . Financial resource strain: Not on file  . Food insecurity:    Worry: Not on file    Inability: Not on file  . Transportation needs:    Medical: Not on file    Non-medical: Not on file  Tobacco Use  . Smoking status: Former Games developer  . Smokeless tobacco: Never Used  Substance and Sexual Activity  . Alcohol use: Not Currently  . Drug use: Never  . Sexual activity: Not Currently  Lifestyle  . Physical activity:    Days per week: Not on file    Minutes per session: Not on file  . Stress: Not on file  Relationships  . Social connections:    Talks on phone: Not on file    Gets together: Not on file    Attends religious service: Not on file    Active member of club or organization: Not on file    Attends meetings of clubs or organizations: Not on file    Relationship status: Not  on file  Other Topics Concern  . Not on file  Social History Narrative  . Not on file   Additional Social History:    Allergies:   Allergies  Allergen Reactions  . Codeine     REACTION: GI upset    Labs:  Results for orders placed or performed during the hospital encounter of 06/28/18 (from the past 48 hour(s))  CBC with Differential     Status: Abnormal   Collection Time: 06/28/18  7:23 PM  Result Value Ref Range   WBC 8.6 4.0 - 10.5 K/uL   RBC 3.76 (Butler) 3.87 - 5.11 MIL/uL   Hemoglobin 12.4 12.0 - 15.0 g/dL   HCT 95.6 (Butler) 21.3 - 08.6 %   MCV 91.8 80.0 - 100.0 fL   MCH 33.0 26.0 - 34.0 pg   MCHC 35.9 30.0 - 36.0 g/dL   RDW 57.8 46.9 - 62.9 %   Platelets 120 (Butler) 150 - 400 K/uL   nRBC 0.0 0.0 - 0.2 %   Neutrophils Relative % 73 %   Neutro Abs 6.3 1.7 - 7.7 K/uL   Lymphocytes Relative 16 %   Lymphs Abs 1.3 0.7 - 4.0 K/uL   Monocytes Relative 8 %   Monocytes Absolute 0.7 0.1 - 1.0 K/uL   Eosinophils Relative 2 %   Eosinophils Absolute 0.2 0.0 - 0.5 K/uL    Basophils Relative 1 %   Basophils Absolute 0.0 0.0 - 0.1 K/uL   Immature Granulocytes 0 %   Abs Immature Granulocytes 0.02 0.00 - 0.07 K/uL    Comment: Performed at [facility], [address] [place], [place] [address]  Comprehensive metabolic panel     Status: Abnormal   Collection Time: 06/28/18  7:23 PM  Result Value Ref Range   Sodium 140 135 - 145 mmol/Butler   Potassium 4.1 3.5 - 5.1 mmol/Butler   Chloride 105 98 - 111 mmol/Butler   CO2 24 22 - 32 mmol/Butler   Glucose, Bld 125 (H) 70 - 99 mg/dL   BUN 30 (H) 8 - 23 mg/dL   Creatinine, Ser 3.24 0.44 - 1.00 mg/dL   Calcium 9.5 8.9 - 40.1 mg/dL   Total Protein 7.1 6.5 - 8.1 g/dL   Albumin 4.7 3.5 - 5.0 g/dL   AST 27 15 - 41 U/Butler   ALT 18 0 - 44 U/Butler   Alkaline Phosphatase 99 38 - 126 U/Butler   Total Bilirubin 1.4 (H) 0.3 - 1.2 mg/dL   GFR calc non Af Amer >60 >60 mL/min   GFR calc Af Amer >60 >60 mL/min   Anion gap 11 5 - 15    Comment: Performed at [facility], [address] [place], [place] [address]  Lipase, blood     Status: None   Collection Time: 06/28/18  7:23 PM  Result Value Ref Range   Lipase 34 11 - 51 U/Butler    Comment: Performed at [facility], [address] [place], [place] [address]  Urinalysis, Complete w Microscopic     Status: Abnormal   Collection Time: 06/28/18  7:24 PM  Result Value Ref Range   Color, Urine STRAW (A) YELLOW   APPearance CLEAR (A) CLEAR   Specific Gravity, Urine 1.009 1.005 - 1.030   pH 5.0 5.0 - 8.0   Glucose, UA NEGATIVE NEGATIVE mg/dL   Hgb urine dipstick MODERATE (A) NEGATIVE   Bilirubin Urine NEGATIVE NEGATIVE   Ketones, ur NEGATIVE NEGATIVE mg/dL   Protein, ur NEGATIVE  NEGATIVE mg/dL   Nitrite NEGATIVE NEGATIVE   Leukocytes,Ua NEGATIVE NEGATIVE   RBC / HPF 0-5 0 - 5 RBC/hpf   WBC, UA 0-5 0 - 5 WBC/hpf   Bacteria, UA NONE SEEN NONE SEEN   Squamous Epithelial / LPF NONE SEEN 0 - 5   Mucus PRESENT    Hyaline Casts, UA PRESENT     Comment: Performed at [facility], [address] [place], [place] [address]    No current facility-administered medications for this encounter.    Current Outpatient Medications  Medication Sig Dispense Refill  . loperamide (IMODIUM A-D) 2 MG tablet Take 1 tablet (2 mg total) by mouth 4 (four) times daily as needed for diarrhea or loose stools. 30 tablet 1  . methocarbamol (ROBAXIN) 500 MG tablet Take 1 tablet (500 mg total) by mouth 4 (four) times daily. 16 tablet 0  . ondansetron (ZOFRAN-ODT) 4 MG disintegrating tablet Take 1 tablet (4 mg total) by mouth every 8 (eight) hours as needed for nausea or vomiting. 20 tablet 1  . predniSONE (DELTASONE) 10 MG tablet Take 1 tablet (10 mg total) by mouth daily. 42 tablet 0  . sertraline (ZOLOFT) 25 MG tablet Take 1 tablet (25 mg total) by mouth daily. 30 tablet 3    Musculoskeletal: Strength & Muscle Tone: within normal limits Gait & Station: normal Patient leans: N/A  Psychiatric Specialty Exam: Physical Exam  Nursing note and vitals reviewed. Constitutional: She is oriented to person, place, and time. She appears well-developed and well-nourished.  HENT:  Head: Normocephalic and atraumatic.  Eyes: Pupils are equal, round, and reactive to light. Conjunctivae and EOM are normal.  Neck: Normal range of motion. Neck supple.  Cardiovascular: Normal rate and regular rhythm.  Respiratory: Effort normal and breath sounds normal.  Musculoskeletal: Normal range of motion.  Neurological: She is alert and oriented to person, place, and time. She has normal reflexes.  Skin: Skin is warm and dry.  Psychiatric: Her behavior is normal. Judgment and thought content normal.    Review of Systems  Psychiatric/Behavioral: Positive for depression. Negative for hallucinations, memory loss, substance abuse and suicidal ideas. The patient is nervous/anxious and has insomnia.   All other systems reviewed and are negative.   Blood pressure 126/76, pulse 66, temperature 98.1 F  (36.7 C), temperature source Oral, resp. rate 16, height 5\' 3"  (1.6 m), weight 77.1 kg, SpO2 100 %.Body mass index is 30.11 kg/m.  General Appearance: Well Groomed  Eye Contact:  Good  Speech:  Clear and Coherent  Volume:  Normal  Mood:  Anxious and Depressed  Affect:  Congruent, Depressed and Tearful  Thought Process:  Goal Directed  Orientation:  Full (Time, Place, and Person)  Thought Content:  Logical  Suicidal Thoughts:  No  Homicidal Thoughts:  No  Memory:  Immediate;   Good Recent;   Good Remote;   Good  Judgement:  Good  Insight:  Good  Psychomotor Activity:  Normal  Concentration:  Concentration: Good and Attention Span: Good  Recall:  Good  Fund of Knowledge:  Good  Language:  Good  Akathisia:  NA  Handed:  Right  AIMS (if indicated):     Assets:  Desire for Improvement Physical Health Social Support  ADL's:  Intact  Cognition:  WNL  Sleep:        Treatment Plan Summary: Plan Patient does not meet criteria to be admitted to inpatient psychiatric unit  Disposition: No evidence of imminent risk to self  or others at present.   Patient does not meet criteria for psychiatric inpatient admission. Supportive therapy provided about ongoing stressors. Refer to IOP. TTS provided patient with outpatient literature on how to access services for mental health  Catalina Gravel, NP 06/28/2018 11:20 PM

## 2018-06-28 NOTE — ED Triage Notes (Signed)
Pt c/o back pain chronically that is increasing over the past 3 weeks. Multiple visits with no dx. Pt reports it getting harder to get around the house.

## 2018-06-28 NOTE — ED Provider Notes (Signed)
[facility] Emergency Department Provider Note  ____________________________________________  Time seen: Approximately 6:37 PM  I have reviewed the triage vital signs and the nursing notes.   HISTORY  Chief Complaint Back Pain    HPI Emily Butler is a 63 y.o. female who presents the emergency department for 2 complaints.  Patient reports that she is experiencing increasing generalized abdominal pain.  Patient reports that she does have a history of hepatitis C that was successfully treated with Harvoni.  Patient also reports that she has a known hiatal hernia.  She reports that she has had increased nausea, vomiting, diarrhea.  No bilious vomit.  No hematic emesis.  No hematochezia.  Patient reports that she has significant  diarrhea however.  Patient reports no fevers or chills, dysuria, polyuria, hematuria.  This is not food specific.  This is been going for several weeks.  Patient is also reporting an increased history of depression.  Patient reports that she has a longstanding history of depression over the past 40 years.  Patient did have a suicidal attempt when she was 63 years old.  Patient also had contemplated and had a plan for suicide in her mid 30s.  Patient currently denies any suicidal or homicidal ideations at this time.  She does report increasing depression however as her primary care would not refill her medications until she saw psychiatry.  Patient reports that she had been successfully treated with Wellbutrin and Lexapro.  Patient reports that at her last primary care visit, she was discussing her history and the healthcare provider was not comfortable filling her medications.  Patient states that she tapered off of her Lexapro and Wellbutrin but has been having increased depression symptoms.  Again, patient denies any suicidal or homicidal ideations at this time.  She does openly discuss her mental health struggles from her late teens including  previous suicidal ideations and suicide attempt.  At this time, patient is requesting psychiatric consult so that she may restart her Lexapro and Wellbutrin which she felt had managed her symptoms very well.        Past Medical History:  Diagnosis Date  . Depression   . Hepatitis C 2011  . Hypertension     Patient Active Problem List   Diagnosis Date Noted  . ABNORMAL RESULT, FUNCTION STUDY, LIVER 12/23/2006  . THROMBOCYTOPENIA 12/20/2006  . EXCESSIVE MENSTRUATION 11/08/2006    History reviewed. No pertinent surgical history.  Prior to Admission medications   Medication Sig Start Date End Date Taking? Authorizing Provider  loperamide (IMODIUM A-D) 2 MG tablet Take 1 tablet (2 mg total) by mouth 4 (four) times daily as needed for diarrhea or loose stools. 06/28/18   Cuthriell, Delorise RoyalsJonathan D, PA-C  methocarbamol (ROBAXIN) 500 MG tablet Take 1 tablet (500 mg total) by mouth 4 (four) times daily. 06/28/18   Cuthriell, Delorise RoyalsJonathan D, PA-C  ondansetron (ZOFRAN-ODT) 4 MG disintegrating tablet Take 1 tablet (4 mg total) by mouth every 8 (eight) hours as needed for nausea or vomiting. 06/28/18   Cuthriell, Delorise RoyalsJonathan D, PA-C  predniSONE (DELTASONE) 10 MG tablet Take 1 tablet (10 mg total) by mouth daily. 06/28/18   Cuthriell, Delorise RoyalsJonathan D, PA-C  sertraline (ZOLOFT) 25 MG tablet Take 1 tablet (25 mg total) by mouth daily. 06/28/18 06/28/19  Cuthriell, Delorise RoyalsJonathan D, PA-C    Allergies Codeine  No family history on file.  Social History Social History   Tobacco Use  . Smoking status: Former Games developermoker  . Smokeless tobacco: Never  Used  Substance Use Topics  . Alcohol use: Not Currently  . Drug use: Never     Review of Systems  Constitutional: No fever/chills Eyes: No visual changes. No discharge ENT: No upper respiratory complaints. Cardiovascular: no chest pain. Respiratory: no cough. No SOB. Gastrointestinal: Diffuse abdominal pain.  Positive for nausea, vomiting, diarrhea.  No  constipation. Genitourinary: Negative for dysuria. No hematuria Musculoskeletal: Negative for musculoskeletal pain. Skin: Negative for rash, abrasions, lacerations, ecchymosis. Neurological: Negative for headaches, focal weakness or numbness. Psychological: Increased depression symptoms.  Denies suicidal homicidal ideations at this time. 10-point ROS otherwise negative.  ____________________________________________   PHYSICAL EXAM:  VITAL SIGNS: ED Triage Vitals  Enc Vitals Group     BP 06/28/18 1748 (!) 154/90     Pulse Rate 06/28/18 1748 80     Resp 06/28/18 1748 16     Temp 06/28/18 1748 (!) 97.5 F (36.4 C)     Temp Source 06/28/18 1748 Oral     SpO2 06/28/18 1748 99 %     Weight 06/28/18 1749 170 lb (77.1 kg)     Height 06/28/18 1749 5\' 3"  (1.6 m)     Head Circumference --      Peak Flow --      Pain Score 06/28/18 1748 8     Pain Loc --      Pain Edu? --      Excl. in GC? --      Constitutional: Alert and oriented. Well appearing and in no acute distress. Eyes: Conjunctivae are normal. PERRL. EOMI. Head: Atraumatic. ENT:      Ears:       Nose: No congestion/rhinnorhea.      Mouth/Throat: Mucous membranes are moist.  Neck: No stridor.   Hematological/Lymphatic/Immunilogical: No cervical lymphadenopathy. Cardiovascular: Normal rate, regular rhythm. Normal S1 and S2.  Good peripheral circulation. Respiratory: Normal respiratory effort without tachypnea or retractions. Lungs CTAB. Good air entry to the bases with no decreased or absent breath sounds. Gastrointestinal: Bowel sounds 4 quadrants.  Bowel sounds are slightly hyperactive throughout.  Soft to palpation globally.  Patient reports mild diffuse tenderness without point specific tenderness.  No significant right lower quadrant tenderness.  No increased tenderness over McBurney's point.  No Speers's sign.  No rebound tenderness.  No Rovsing's or obturators.. No guarding or rigidity. No palpable masses. No  distention. No CVA tenderness. Musculoskeletal: Full range of motion to all extremities. No gross deformities appreciated. Neurologic:  Normal speech and language. No gross focal neurologic deficits are appreciated.  Skin:  Skin is warm, dry and intact. No rash noted. Psychiatric: Mood and affect are normal. Speech and behavior are normal. Patient exhibits appropriate insight and judgement.  Patient openly discusses her mental health issues, depression.  Denies any suicidal homicidal ideations at this time.   ____________________________________________   LABS (all labs ordered are listed, but only abnormal results are displayed)  Labs Reviewed  CBC WITH DIFFERENTIAL/PLATELET - Abnormal; Notable for the following components:      Result Value   RBC 3.76 (*)    HCT 34.5 (*)    Platelets 120 (*)    All other components within normal limits  COMPREHENSIVE METABOLIC PANEL - Abnormal; Notable for the following components:   Glucose, Bld 125 (*)    BUN 30 (*)    Total Bilirubin 1.4 (*)    All other components within normal limits  URINALYSIS, COMPLETE (UACMP) WITH MICROSCOPIC - Abnormal; Notable for the following components:   Color,  Urine STRAW (*)    APPearance CLEAR (*)    Hgb urine dipstick MODERATE (*)    All other components within normal limits  C DIFFICILE QUICK SCREEN W PCR REFLEX  LIPASE, BLOOD   ____________________________________________  EKG   ____________________________________________  RADIOLOGY I personally viewed and evaluated these images as part of my medical decision making, as well as reviewing the written report by the radiologist.  Ct Abdomen Pelvis W Contrast  Result Date: 06/28/2018 CLINICAL DATA:  63 year old female with progressive back pain and generalized abdominal pain x3 weeks. EXAM: CT ABDOMEN AND PELVIS WITH CONTRAST TECHNIQUE: Multidetector CT imaging of the abdomen and pelvis was performed using the standard protocol following bolus  administration of intravenous contrast. CONTRAST:  OMNIPAQUE IOHEXOL 300 MG/ML  SOLN COMPARISON:  CT Abdomen and Pelvis 02/03/2012. FINDINGS: Lower chest: Paraesophageal varices (series 2, image 9). No cardiomegaly, pericardial effusion or pleural effusion. Minor lung base scarring or atelectasis similar to 2013. Hepatobiliary: Nodular and cirrhotic liver. No discrete liver lesion. Negative gallbladder. No bile duct enlargement. Pancreas: Negative. Spleen: Regressed and largely resolved splenomegaly since 2013. Adrenals/Urinary Tract: Normal adrenal glands. Bilateral renal enhancement and contrast excretion is symmetric and within normal limits. Normal proximal ureters. Diminutive and unremarkable urinary bladder. Stomach/Bowel: Decompressed rectosigmoid colon. Mildly redundant sigmoid with mild diverticulosis. No active inflammation. Decompressed transverse and descending colon with mild redundancy. Negative right colon. Oral contrast has just reached the cecum. Normal retrocecal appendix (series 2, image 51). Negative terminal ileum. No dilated small bowel. [place] off a G ule and gastrohepatic ligament varices, otherwise negative stomach and duodenum. No free air, free fluid. Vascular/Lymphatic: Aortoiliac calcified atherosclerosis. Major arterial structures are patent. Portal venous system is patent. Gastrohepatic ligament and Paraesophageal varices appear increased since 2013. No lymphadenopathy. Reproductive: Negative. Other: No pelvic free fluid. Musculoskeletal: Widespread severe disc degeneration. Mild scoliosis. Chronic L5 pars fractures and spondylolisthesis with ankylosed L5-S1 bodies. No acute osseous abnormality identified. IMPRESSION: 1. 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 However, splenomegaly has regressed since 2013 and there is no ascites today. 2. No acute or inflammatory process identified in the abdomen or pelvis. Normal  appendix. 3. Severe spinal disc degeneration. Chronic L5-S1 anterolisthesis and ankylosis. Electronically Signed   By: Odessa Fleming M.D.   On: 06/28/2018 20:51    ____________________________________________    PROCEDURES  Procedure(s) performed:    Procedures    Medications  sodium chloride 0.9 % bolus 1,000 mL (0 mLs Intravenous Stopped 06/28/18 2249)  iohexol (OMNIPAQUE) 240 MG/ML injection 50 mL (50 mLs Oral Contrast Given 06/28/18 1932)  iohexol (OMNIPAQUE) 300 MG/ML solution 100 mL (100 mLs Intravenous Contrast Given 06/28/18 2031)  dexamethasone (DECADRON) injection 10 mg (10 mg Intravenous Given 06/28/18 2246)     ____________________________________________   INITIAL IMPRESSION / ASSESSMENT AND PLAN / ED COURSE  Pertinent labs & imaging results that were available during my care of the patient were reviewed by me and considered in my medical decision making (see chart for details).  Review of the [place] CSRS was performed in accordance of the [facility] prior to dispensing any controlled drugs.           Patient's diagnosis is consistent with nausea, vomiting, diarrhea and depression.  Patient presented to the emergency department with a complaint of back pain, ongoing nausea, vomiting, diarrhea as well as depression.  Patient reports a longstanding history of depression, denies any suicidal or homicidal ideations.  Patient had seen her primary  care provider who refused to fill her antidepressants.  Patient reports that she was suffering from more depression but denied any suicidal or Seidel ideations.  She was agreeable to seeing psychiatry who evaluated the patient.  They agree that the patient is not a danger to herself or others.  On the recommendation, patient will be started on Zoloft, 25 mg.  She is given information for TTS for follow-up.  Labs, imaging are reassuring with the abdomen with no acute signs of infection.  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  This  is consistent with patient's known hepatitis that was successfully treated with Harvoni.  Otherwise, no acute findings.  Patient be given symptom control medications of loperamide and Zofran for nausea and vomiting and diarrhea.  Patient is given prednisone and a muscle relaxer for chronic back pain.  Patient is started on Zoloft for her depression.  Patient has follow-up instructions for GI as well as psychiatry..  Patient is given ED precautions to return to the ED for any worsening or new symptoms.     ____________________________________________  FINAL CLINICAL IMPRESSION(S) / ED DIAGNOSES  Final diagnoses:  Nausea vomiting and diarrhea  Moderate episode of recurrent major depressive disorder (HCC)  Chronic midline thoracic back pain      NEW MEDICATIONS STARTED DURING THIS VISIT:  ED Discharge Orders         Ordered    predniSONE (DELTASONE) 10 MG tablet  Daily    Note to Pharmacy:  Take 6 pills x 2 days, 5 pills x 2 days, 4 pills x 2 days, 3 pills x 2 days, 2 pills x 2 days, and 1 pill x 2 days   06/28/18 2229    methocarbamol (ROBAXIN) 500 MG tablet  4 times daily     06/28/18 2229    ondansetron (ZOFRAN-ODT) 4 MG disintegrating tablet  Every 8 hours PRN     06/28/18 2229    loperamide (IMODIUM A-D) 2 MG tablet  4 times daily PRN     06/28/18 2229    sertraline (ZOLOFT) 25 MG tablet  Daily     06/28/18 2229              This chart was dictated using voice recognition software/Dragon. Despite best efforts to proofread, errors can occur which can change the meaning. Any change was purely unintentional.    Racheal Patches, PA-C 06/28/18 2249    Phineas Semen, MD 06/28/18 2252

## 2018-08-11 ENCOUNTER — Ambulatory Visit (INDEPENDENT_AMBULATORY_CARE_PROVIDER_SITE_OTHER): Payer: Medicare Other | Admitting: Family Medicine

## 2018-08-11 ENCOUNTER — Other Ambulatory Visit: Payer: Self-pay

## 2018-08-11 ENCOUNTER — Encounter: Payer: Self-pay | Admitting: Family Medicine

## 2018-08-11 DIAGNOSIS — K7460 Unspecified cirrhosis of liver: Secondary | ICD-10-CM

## 2018-08-11 DIAGNOSIS — K219 Gastro-esophageal reflux disease without esophagitis: Secondary | ICD-10-CM | POA: Diagnosis not present

## 2018-08-11 DIAGNOSIS — R1011 Right upper quadrant pain: Secondary | ICD-10-CM

## 2018-08-11 DIAGNOSIS — D696 Thrombocytopenia, unspecified: Secondary | ICD-10-CM | POA: Diagnosis not present

## 2018-08-11 DIAGNOSIS — F322 Major depressive disorder, single episode, severe without psychotic features: Secondary | ICD-10-CM

## 2018-08-11 MED ORDER — SIMETHICONE 80 MG PO CHEW: 80.0000 mg | CHEWABLE_TABLET | Freq: Four times a day (QID) | ORAL | 0 refills | Status: DC | PRN

## 2018-08-11 NOTE — Progress Notes (Signed)
Name: Emily [facility]   MRN: [id number]    DOB: Aug 28, 1955   Date:08/11/2018       Progress Note  Subjective  Chief Complaint  Chief Complaint  Patient presents with  . Establish Care  . Referral    psychiatry   . Chest Pain    more to the right side, gas, indigestion    I connected with  Emily Alfonna L Christley  on 08/11/18 at 10:20 AM EDT by a video enabled telemedicine application and verified that I am speaking with the correct person using two identifiers.  I discussed the limitations of evaluation and management by telemedicine and the availability of in person appointments. The patient expressed understanding and agreed to proceed. Staff also discussed with the patient that there may be a patient responsible charge related to this service. Patient Location: Home Provider Location: Office Additional Individuals present: None  HPI  Pt presents to establish care.  She is switching PCP's today.  RUQ:  Right sided chest/upper quadrant pain, lasted for about an hour last night, has had 2 other episodes over the last week.  Feels like gas pains that radiate into the right shoulder.  When she is able to burp, the pain is relieved. She does still have her gallbladder. She did abuse alcohol for many years, and she has cirrhosis of the liver. She is taking prilosec 20mg  once daily PRN for GERD.  She has had diarrhea ongoing, always has abdominal pain, nothing worsening.  Was seen in April in the ER for similar illness and had CT abdomen - which did show cirrhosis without ascites, no acute abnormalities.   Cirrhosis: She took harvoni which treated her Hep C, but she still has cirrhosis.  She does not have a GI doctor that follows her for this.  We will check labs today  IFG: Has history of IFG. No polyuria, polydipsia, or polyphagia.   MDD: She has history of ETOH abuse, was on Lexapro and Wellbutrin, however her prior PCP refused to fill due to concern for hallucinations and SI, and their request  for her to see [facility].  She weaned herself off of the medications.  She has history of hallucinations and SI, but is currently denying SI and is feeling fairly well.  She requests referral today which I will gladly provide.  Patient Active Problem List   Diagnosis Date Noted  . MDD (major depressive disorder) 06/28/2018  . ABNORMAL RESULT, FUNCTION STUDY, LIVER 12/23/2006  . THROMBOCYTOPENIA 12/20/2006  . EXCESSIVE MENSTRUATION 11/08/2006    Past Surgical History:  Procedure Laterality Date  . CESAREAN SECTION      Family History  Problem Relation Age of Onset  . Alcohol abuse Mother   . Drug abuse Mother   . Heart disease Father     Social History   Socioeconomic History  . Marital status: Divorced    Spouse name: Not on file  . Number of children: 4  . Years of education: Not on file  . Highest education level: Not on file  Occupational History  . Occupation: disability  Social Needs  . Financial resource strain: Not hard at all  . Food insecurity    Worry: Never true    Inability: Never true  . Transportation needs    Medical: Yes    Non-medical: Yes  Tobacco Use  . Smoking status: Former Games developermoker  . Smokeless tobacco: Never Used  Substance and Sexual Activity  . Alcohol use: Not Currently  Comment: Alcoholic quit 10 years ago  . Drug use: Never  . Sexual activity: Not Currently  Lifestyle  . Physical activity    Days per week: 5 days    Minutes per session: 40 min  . Stress: To some extent  Relationships  . Social connections    Talks on phone: More than three times a week    Gets together: More than three times a week    Attends religious service: Never    Active member of club or organization: No    Attends meetings of clubs or organizations: Never    Relationship status: Divorced  . Intimate partner violence    Fear of current or ex partner: No    Emotionally abused: No    Physically abused: No    Forced sexual activity: No  Other Topics Concern   . Not on file  Social History Narrative  . Not on file     Current Outpatient Medications:  .  aspirin EC 81 MG tablet, Take 81 mg by mouth daily., Disp: , Rfl:  .  ferrous sulfate 325 (65 FE) MG tablet, Take by mouth., Disp: , Rfl:  .  furosemide (LASIX) 40 MG tablet, Take by mouth., Disp: , Rfl:  .  loperamide (IMODIUM A-D) 2 MG tablet, Take 1 tablet (2 mg total) by mouth 4 (four) times daily as needed for diarrhea or loose stools., Disp: 30 tablet, Rfl: 1 .  losartan (COZAAR) 25 MG tablet, Take by mouth., Disp: , Rfl:  .  Multiple Vitamin (MULTI-VITAMIN) tablet, Take by mouth., Disp: , Rfl:  .  omeprazole (PRILOSEC) 20 MG capsule, Take by mouth daily., Disp: , Rfl:  .  predniSONE (DELTASONE) 10 MG tablet, Take 1 tablet (10 mg total) by mouth daily., Disp: 42 tablet, Rfl: 0 .  propranolol (INDERAL) 40 MG tablet, Take by mouth., Disp: , Rfl:  .  sertraline (ZOLOFT) 25 MG tablet, Take 1 tablet (25 mg total) by mouth daily., Disp: 30 tablet, Rfl: 3 .  methocarbamol (ROBAXIN) 500 MG tablet, Take 1 tablet (500 mg total) by mouth 4 (four) times daily. (Patient not taking: Reported on 08/11/2018), Disp: 16 tablet, Rfl: 0 .  ondansetron (ZOFRAN-ODT) 4 MG disintegrating tablet, Take 1 tablet (4 mg total) by mouth every 8 (eight) hours as needed for nausea or vomiting. (Patient not taking: Reported on 08/11/2018), Disp: 20 tablet, Rfl: 1  Allergies  Allergen Reactions  . Codeine     REACTION: GI upset    I personally reviewed active problem list, medication list, allergies, notes from last encounter, lab results with the patient/caregiver today.   ROS Ten systems reviewed and is negative except as mentioned in HPI  Objective  Virtual encounter, vitals not obtained.  There is no height or weight on file to calculate BMI.  Physical Exam Constitutional: Patient appears well-developed and well-nourished. No distress.  HENT: Head: Normocephalic and atraumatic.  Neck: Normal range of  motion. Pulmonary/Chest: Effort normal. No respiratory distress. Speaking in complete sentences Neurological: Pt is alert and oriented to person, place, and time. Coordination, speech and gait are normal.  Psychiatric: Patient has a normal mood and affect. behavior is normal. Judgment and thought content normal. Abdominal: Patient denies abdominal tenderness on self palpation.  No results found for this or any previous visit (from the past 72 hour(s)).  PHQ2/9: [facility]  Decreased Interest 0  Down, Depressed, Hopeless 0  PHQ - 2 Score 0  Altered sleeping 3  Tired, decreased energy 2  Change in appetite 0  Feeling bad or failure about yourself  3  Trouble concentrating 0  Moving slowly or fidgety/restless 0  Suicidal thoughts 0  PHQ-9 Score 8  Difficult doing work/chores Somewhat difficult   PHQ-2/9 Result is positive.    Fall Risk: Fall Risk  08/11/2018  Falls in the past year? 0  Number falls in past yr: 0  Injury with Fall? 0  Follow up Falls evaluation completed     Assessment & Plan  1. Gastroesophageal reflux disease without esophagitis - Advised to reduce triggering foods, no carbonation or irritative beverages. - simethicone (GAS-X) 80 MG chewable tablet; Chew 1 tablet (80 mg total) by mouth every 6 (six) hours as needed for flatulence.  Dispense: 30 tablet; Refill: 0 - Ambulatory referral to Gastroenterology  2. Hepatic cirrhosis, unspecified hepatic cirrhosis type, unspecified whether ascites present [facility]) - Ambulatory referral to Gastroenterology  3. Thrombocytopenia, unspecified (HCC) - CBC w/Diff/Platelet  4. Right upper quadrant abdominal pain - CBC w/Diff/Platelet - COMPLETE METABOLIC PANEL WITH GFR - simethicone (GAS-X) 80 MG chewable tablet; Chew 1 tablet (80 mg total) by mouth every 6 (six) hours as needed for flatulence.  Dispense: 30 tablet; Refill: 0 - Ambulatory referral to Gastroenterology - Advised if chest pain  recurs, that due to my very limited examination over video conference, she needs to present to ER for further evaluation. Follow up in 1 week in office for evaluation  5. Current severe episode of major depressive disorder without psychotic features, unspecified whether recurrent ([place]) - Ambulatory referral to Psychiatry  I discussed the assessment and treatment plan with the patient. The patient was provided an opportunity to ask questions and all were answered. The patient agreed with the plan and demonstrated an understanding of the instructions.  The patient was advised to call back or seek an in-person evaluation if the symptoms worsen or if the condition fails to improve as anticipated.  I provided 36 minutes of non-face-to-face time during this encounter.

## 2018-08-16 ENCOUNTER — Encounter: Payer: Self-pay | Admitting: *Deleted

## 2018-08-17 ENCOUNTER — Encounter: Payer: Self-pay | Admitting: Family Medicine

## 2018-08-17 ENCOUNTER — Ambulatory Visit (INDEPENDENT_AMBULATORY_CARE_PROVIDER_SITE_OTHER): Payer: Medicare Other | Admitting: Family Medicine

## 2018-08-17 ENCOUNTER — Other Ambulatory Visit: Payer: Self-pay

## 2018-08-17 VITALS — BP 126/72 | HR 68 | Temp 98.4°F | Resp 16 | Ht 63.0 in | Wt 178.1 lb

## 2018-08-17 DIAGNOSIS — K219 Gastro-esophageal reflux disease without esophagitis: Secondary | ICD-10-CM | POA: Diagnosis not present

## 2018-08-17 DIAGNOSIS — F322 Major depressive disorder, single episode, severe without psychotic features: Secondary | ICD-10-CM

## 2018-08-17 DIAGNOSIS — Z1231 Encounter for screening mammogram for malignant neoplasm of breast: Secondary | ICD-10-CM

## 2018-08-17 DIAGNOSIS — K7460 Unspecified cirrhosis of liver: Secondary | ICD-10-CM | POA: Diagnosis not present

## 2018-08-17 DIAGNOSIS — D696 Thrombocytopenia, unspecified: Secondary | ICD-10-CM

## 2018-08-17 DIAGNOSIS — R7301 Impaired fasting glucose: Secondary | ICD-10-CM

## 2018-08-17 DIAGNOSIS — E7800 Pure hypercholesterolemia, unspecified: Secondary | ICD-10-CM

## 2018-08-17 DIAGNOSIS — Z114 Encounter for screening for human immunodeficiency virus [HIV]: Secondary | ICD-10-CM

## 2018-08-17 DIAGNOSIS — I10 Essential (primary) hypertension: Secondary | ICD-10-CM

## 2018-08-17 DIAGNOSIS — N189 Chronic kidney disease, unspecified: Secondary | ICD-10-CM

## 2018-08-17 DIAGNOSIS — I4891 Unspecified atrial fibrillation: Secondary | ICD-10-CM

## 2018-08-17 DIAGNOSIS — R1011 Right upper quadrant pain: Secondary | ICD-10-CM | POA: Diagnosis not present

## 2018-08-17 MED ORDER — SERTRALINE HCL 25 MG PO TABS: 25.0000 mg | ORAL_TABLET | Freq: Every day | ORAL | 0 refills | Status: DC

## 2018-08-17 MED ORDER — FUROSEMIDE 40 MG PO TABS: 40.0000 mg | ORAL_TABLET | Freq: Every day | ORAL | 1 refills | Status: DC

## 2018-08-17 MED ORDER — LOSARTAN POTASSIUM 25 MG PO TABS: 25.0000 mg | ORAL_TABLET | Freq: Every day | ORAL | 1 refills | Status: DC

## 2018-08-17 MED ORDER — OMEPRAZOLE 20 MG PO CPDR: 20.0000 mg | DELAYED_RELEASE_CAPSULE | Freq: Every day | ORAL | 1 refills | Status: DC

## 2018-08-17 MED ORDER — PROPRANOLOL HCL 40 MG PO TABS: 40.0000 mg | ORAL_TABLET | Freq: Two times a day (BID) | ORAL | 1 refills | Status: DC

## 2018-08-17 NOTE — Progress Notes (Signed)
Name: Emily Butler   MRN: [id number]    DOB: [birth year]   Date:08/17/2018       Progress Note  Subjective  Chief Complaint  Chief Complaint  Patient presents with  . Follow-up    1 week follow up  . Medication Refill    HPI  Pt presents for 1 week follow up on RUQ pain and the following:  RUQ/GERD: Has not had any chest discomfort since last visit.  She is still having nausea after eating.  She has cut back on caffeine and carbonated beverages.  RUQ pain is still occurring daily.  Episodes last for about an hour after eating and sometimes it does not go away at all.  She does still have her gallbladder. She did abuse alcohol for many years, and she has cirrhosis of the liver. She is taking prilosec 20mg  once daily PRN for GERD.  She has had diarrhea ongoing, but not worse than her usual.  Was seen in April in the ER for similar illness and had CT abdomen - which did show cirrhosis without ascites, no acute abnormalities.   Cirrhosis/History Hep C: She took Harvoni in 2016 which treated her Hep C, but she still has cirrhosis.  She does not have a GI doctor that follows her for this.  We will check CMP today - ordered at last check, will wait on GI referral to perform Hepatitis screening.  IFG: Has history of IFG. No polyuria, polydipsia, or polyphagia.   MDD: She has history of ETOH abuse, was on Lexapro and Wellbutrin, however her prior PCP refused to fill due to concern for hallucinations and SI, and their request for her to see [facility].  She weaned herself off of the medications.  She has history of hallucinations and SI, but is currently denying SI and is feeling fairly well.  She does endorse hallucination last night - saw a frame on her wall with a man in it that morphed into a child.  There are no auditory hallucinations at this time. We referred at last visit, is seeing Dr. Elna BreslowEappen and her therapist on 08/19/2018.  Overweight:  Hx Afib: Diagnosed in 2011.  Was on atenolol, then  switched to propanolol - she is not sure why she switched.  She is taking this as prescribed.  Still has very occasional flutter in the chest - occurs maybe once or twice a week.  She denies history of heart failure, but does endorse occasional swelling, so she takes Lasix 40mg  once daily. Taking aspirin daily. Saw a cardiologist in the past, but was told no need for routine follow up.  Never took blood thinner.  Declines referral at this time  HTN/CKD -does take medications as prescribed - current regimen includes Losartan, Lasix, propanolol.  - taking medications as instructed, no medication side effects noted, no TIAs, no chest pain on exertion, no dyspnea on exertion, no swelling of ankles as long as she takes her lasix - DASH diet discussed - pt does not follow a low sodium diet.  Will check labs today to evaluate CKD as this is noted on her history.  Thrombocytopenia: Has some fatigue, will check labs today.  Not smoking.  Patient Active Problem List   Diagnosis Date Noted  . MDD (major depressive disorder) 06/28/2018  . Elevated fasting blood sugar 11/06/2014  . Chronic kidney disease, unspecified 08/13/2013  . GERD (gastroesophageal reflux disease) 08/13/2013  . Restless legs syndrome (RLS) 05/23/2013  . Umbilical hernia 10/31/2012  .  Atrial fibrillation (HCC) 07/07/2012  . Cirrhosis (HCC) 07/07/2012  . Former smoker 07/07/2012  . Hypertension 07/07/2012  . Chronic hepatitis C (HCC) 07/07/2012  . ABNORMAL RESULT, FUNCTION STUDY, LIVER 12/23/2006  . THROMBOCYTOPENIA 12/20/2006  . EXCESSIVE MENSTRUATION 11/08/2006    Past Surgical History:  Procedure Laterality Date  . CESAREAN SECTION      Family History  Problem Relation Age of Onset  . Alcohol abuse Mother   . Drug abuse Mother   . Heart disease Father     Social History   Socioeconomic History  . Marital status: Divorced    Spouse name: Not on file  . Number of children: 4  . Years of education: Not on file  .  Highest education level: Not on file  Occupational History  . Occupation: disability  Social Needs  . Financial resource strain: Not hard at all  . Food insecurity    Worry: Never true    Inability: Never true  . Transportation needs    Medical: Yes    Non-medical: Yes  Tobacco Use  . Smoking status: Former Games developermoker  . Smokeless tobacco: Never Used  Substance and Sexual Activity  . Alcohol use: Not Currently    Comment: Alcoholic quit 10 years ago  . Drug use: Never  . Sexual activity: Not Currently  Lifestyle  . Physical activity    Days per week: 5 days    Minutes per session: 40 min  . Stress: To some extent  Relationships  . Social connections    Talks on phone: More than three times a week    Gets together: More than three times a week    Attends religious service: Never    Active member of club or organization: No    Attends meetings of clubs or organizations: Never    Relationship status: Divorced  . Intimate partner violence    Fear of current or ex partner: No    Emotionally abused: No    Physically abused: No    Forced sexual activity: No  Other Topics Concern  . Not on file  Social History Narrative  . Not on file     Current Outpatient Medications:  .  aspirin EC 81 MG tablet, Take 81 mg by mouth daily., Disp: , Rfl:  .  ferrous sulfate 325 (65 FE) MG tablet, Take by mouth., Disp: , Rfl:  .  furosemide (LASIX) 40 MG tablet, Take by mouth., Disp: , Rfl:  .  losartan (COZAAR) 25 MG tablet, Take by mouth., Disp: , Rfl:  .  Multiple Vitamin (MULTI-VITAMIN) tablet, Take by mouth., Disp: , Rfl:  .  omeprazole (PRILOSEC) 20 MG capsule, Take by mouth daily., Disp: , Rfl:  .  propranolol (INDERAL) 40 MG tablet, Take by mouth., Disp: , Rfl:  .  sertraline (ZOLOFT) 25 MG tablet, Take 1 tablet (25 mg total) by mouth daily., Disp: 30 tablet, Rfl: 3 .  loperamide (IMODIUM A-D) 2 MG tablet, Take 1 tablet (2 mg total) by mouth 4 (four) times daily as needed for diarrhea  or loose stools. (Patient not taking: Reported on 08/17/2018), Disp: 30 tablet, Rfl: 1 .  methocarbamol (ROBAXIN) 500 MG tablet, Take 1 tablet (500 mg total) by mouth 4 (four) times daily. (Patient not taking: Reported on 08/11/2018), Disp: 16 tablet, Rfl: 0 .  ondansetron (ZOFRAN-ODT) 4 MG disintegrating tablet, Take 1 tablet (4 mg total) by mouth every 8 (eight) hours as needed for nausea or vomiting. (Patient not taking: Reported  on 08/11/2018), Disp: 20 tablet, Rfl: 1 .  predniSONE (DELTASONE) 10 MG tablet, Take 1 tablet (10 mg total) by mouth daily. (Patient not taking: Reported on 08/17/2018), Disp: 42 tablet, Rfl: 0 .  simethicone (GAS-X) 80 MG chewable tablet, Chew 1 tablet (80 mg total) by mouth every 6 (six) hours as needed for flatulence. (Patient not taking: Reported on 08/17/2018), Disp: 30 tablet, Rfl: 0  Allergies  Allergen Reactions  . Codeine     REACTION: GI upset    I personally reviewed active problem list, medication list, allergies, notes from last encounter, lab results with the patient/caregiver today.   ROS Ten systems reviewed and is negative except as mentioned in HPI  Objective  Vitals:   08/17/18 1241  BP: 126/72  Pulse: 68  Resp: 16  Temp: 98.4 F (36.9 C)  TempSrc: Oral  SpO2: 98%  Weight: 178 lb 1.6 oz (80.8 kg)  Height: 5\' 3"  (1.6 m)    Body mass index is 31.55 kg/m.  Physical Exam  Constitutional: Patient appears well-developed and well-nourished. No distress.  HENT: Head: Normocephalic and atraumatic. Ears: bilateral TMs with no erythema or effusion; Nose: Nose normal. Mouth/Throat: Oropharynx is clear and moist. No oropharyngeal exudate or tonsillar swelling.  Eyes: Conjunctivae and EOM are normal. No scleral icterus.  Pupils are equal, round, and reactive to light.  Neck: Normal range of motion. Neck supple. No JVD present. No thyromegaly present.  Cardiovascular: Normal rate, regular rhythm and normal heart sounds.  No murmur heard. No BLE  edema. Pulmonary/Chest: Effort normal and breath sounds normal. No respiratory distress. Abdominal: Soft. Bowel sounds are normal, no distension. There is tenderness int he RUQ, cannot appreciate the liver borders. No masses.Umbilical hernia is present and soft, quite small in appearance, no color changes, non-tender. Musculoskeletal: Normal range of motion, no joint effusions. No gross deformities Neurological: Pt is alert and oriented to person, place, and time. No cranial nerve deficit. Coordination, balance, strength, speech and gait are normal.  Skin: Skin is warm and dry. No rash noted. No erythema.  Psychiatric: Patient has a normal mood and affect. behavior is normal. Judgment and thought content normal.  No results found for this or any previous visit (from the past 72 hour(s)).  PHQ2/9: [facility] screen [facility] 08/17/2018 08/11/2018  Decreased Interest 0 0  Down, Depressed, Hopeless 0 0  PHQ - 2 Score 0 0  Altered sleeping 2 3  Tired, decreased energy 0 2  Change in appetite 0 0  Feeling bad or failure about yourself  0 3  Trouble concentrating 0 0  Moving slowly or fidgety/restless 0 0  Suicidal thoughts 0 0  PHQ-9 Score 2 8  Difficult doing work/chores Not difficult at all Somewhat difficult   PHQ-2/9 Result is negative.    Fall Risk: Fall Risk  08/17/2018 08/11/2018  Falls in the past year? 0 0  Number falls in past yr: 0 0  Injury with Fall? 0 0  Follow up Falls evaluation completed Falls evaluation completed   Assessment & Plan  1. Hepatic cirrhosis, unspecified hepatic cirrhosis type, unspecified whether ascites present (HCC) - Has GI appt set up - US Abdomen Limited RUQ; Future  2. Gastroesophageal reflux disease without esophagitis - US Abdomen Limited RUQ; Future - omeprazole (PRILOSEC) 20 MG capsule; Take 1 capsule (20 mg total) by mouth daily.  Dispense: 90 capsule; Refill: 1  3. Right upper quadrant abdominal pain - US Abdomen Limited RUQ; Future  4.  IFG (impaired  fasting glucose) - Hemoglobin A1c  5. Elevated LDL cholesterol level - Lipid panel  6. Current severe episode of major depressive disorder without psychotic features, unspecified whether recurrent (HCC) - sertraline (ZOLOFT) 25 MG tablet; Take 1 tablet (25 mg total) by mouth daily.  Dispense: 30 tablet; Refill: 0 - Has appt with psychiatry.  7. Atrial fibrillation, unspecified type (HCC) - propranolol (INDERAL) 40 MG tablet; Take 1 tablet (40 mg total) by mouth 2 (two) times daily.  Dispense: 180 tablet; Refill: 1 - furosemide (LASIX) 40 MG tablet; Take 1 tablet (40 mg total) by mouth daily.  Dispense: 90 tablet; Refill: 1 - Recommend cardiology referral, she declines as she has several appointments upcoming, we will maintain her management today and refer at follow up in 3-4 months.  8. Thrombocytopenia, unspecified (HCC) - CBC per orders  9. Essential hypertension - DASH diet reinforced - propranolol (INDERAL) 40 MG tablet; Take 1 tablet (40 mg total) by mouth 2 (two) times daily.  Dispense: 180 tablet; Refill: 1 - furosemide (LASIX) 40 MG tablet; Take 1 tablet (40 mg total) by mouth daily.  Dispense: 90 tablet; Refill: 1 - losartan (COZAAR) 25 MG tablet; Take 1 tablet (25 mg total) by mouth daily.  Dispense: 90 tablet; Refill: 1  10. Chronic kidney disease, unspecified CKD stage - CMP per labs  11. Breast cancer screening by mammogram - MM 3D SCREEN BREAST BILATERAL; Future  12. Encounter for screening for HIV - HIV Antibody (routine testing w rflx)

## 2018-08-18 ENCOUNTER — Other Ambulatory Visit: Payer: Self-pay | Admitting: Family Medicine

## 2018-08-18 DIAGNOSIS — E876 Hypokalemia: Secondary | ICD-10-CM

## 2018-08-18 LAB — CBC WITH DIFFERENTIAL/PLATELET
Absolute Monocytes: 448 cells/uL (ref 200–950)
Basophils Absolute: 39 cells/uL (ref 0–200)
Basophils Relative: 0.7 %
Eosinophils Absolute: 118 cells/uL (ref 15–500)
Eosinophils Relative: 2.1 %
HCT: 33.7 % — ABNORMAL LOW (ref 35.0–45.0)
Hemoglobin: 12 g/dL (ref 11.7–15.5)
Lymphs Abs: 812 cells/uL — ABNORMAL LOW (ref 850–3900)
MCH: 32.9 pg (ref 27.0–33.0)
MCHC: 35.6 g/dL (ref 32.0–36.0)
MCV: 92.3 fL (ref 80.0–100.0)
MPV: 11 fL (ref 7.5–12.5)
Monocytes Relative: 8 %
Neutro Abs: 4183 cells/uL (ref 1500–7800)
Neutrophils Relative %: 74.7 %
Platelets: 146 10*3/uL (ref 140–400)
RBC: 3.65 10*6/uL — ABNORMAL LOW (ref 3.80–5.10)
RDW: 12.8 % (ref 11.0–15.0)
Total Lymphocyte: 14.5 %
WBC: 5.6 10*3/uL (ref 3.8–10.8)

## 2018-08-18 LAB — COMPLETE METABOLIC PANEL WITH GFR
AG Ratio: 2.6 (calc) — ABNORMAL HIGH (ref 1.0–2.5)
ALT: 11 U/L (ref 6–29)
AST: 21 U/L (ref 10–35)
Albumin: 4.4 g/dL (ref 3.6–5.1)
Alkaline phosphatase (APISO): 87 U/L (ref 37–153)
BUN: 23 mg/dL (ref 7–25)
CO2: 24 mmol/L (ref 20–32)
Calcium: 9.3 mg/dL (ref 8.6–10.4)
Chloride: 107 mmol/L (ref 98–110)
Creat: 0.99 mg/dL (ref 0.50–0.99)
GFR, Est African American: 71 mL/min/{1.73_m2} (ref >=60)
GFR, Est Non African American: 61 mL/min/{1.73_m2} (ref >=60)
Globulin: 1.7 g/dL (calc) — ABNORMAL LOW (ref 1.9–3.7)
Glucose, Bld: 122 mg/dL — ABNORMAL HIGH (ref 65–99)
Potassium: 3.2 mmol/L — ABNORMAL LOW (ref 3.5–5.3)
Sodium: 140 mmol/L (ref 135–146)
Total Bilirubin: 1 mg/dL (ref 0.2–1.2)
Total Protein: 6.1 g/dL (ref 6.1–8.1)

## 2018-08-18 LAB — HEMOGLOBIN A1C
Hgb A1c MFr Bld: 5.3 % of total Hgb (ref <5.7)
Mean Plasma Glucose: 105 (calc)
eAG (mmol/L): 5.8 (calc)

## 2018-08-18 LAB — HIV ANTIBODY (ROUTINE TESTING W REFLEX): HIV 1&2 Ab, 4th Generation: NONREACTIVE

## 2018-08-18 LAB — LIPID PANEL
Cholesterol: 178 mg/dL (ref <200)
HDL: 43 mg/dL — ABNORMAL LOW (ref >=50)
LDL Cholesterol (Calc): 107 mg/dL (calc) — ABNORMAL HIGH
Non-HDL Cholesterol (Calc): 135 mg/dL (calc) — ABNORMAL HIGH (ref <130)
Total CHOL/HDL Ratio: 4.1 (calc) (ref <5.0)
Triglycerides: 165 mg/dL — ABNORMAL HIGH (ref <150)

## 2018-08-23 ENCOUNTER — Ambulatory Visit (INDEPENDENT_AMBULATORY_CARE_PROVIDER_SITE_OTHER): Payer: Medicaid Other | Admitting: Licensed Clinical Social Worker

## 2018-08-23 DIAGNOSIS — F331 Major depressive disorder, recurrent, moderate: Secondary | ICD-10-CM
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
## 2018-08-29 ENCOUNTER — Ambulatory Visit: Payer: Medicare Other

## 2018-09-04 ENCOUNTER — Ambulatory Visit: Payer: Medicare Other | Attending: Family Medicine

## 2018-09-04 ENCOUNTER — Telehealth: Payer: Self-pay | Admitting: Family Medicine

## 2018-09-04 NOTE — Telephone Encounter (Signed)
Left message for patient

## 2018-09-04 NOTE — Telephone Encounter (Signed)
-----   Message from Hubbard Hartshorn, FNP sent at 08/18/2018 11:56 AM EDT ----- Regarding: Have patient come back in for repeat CMP Have patient come back in for repeat CMP

## 2018-09-05 ENCOUNTER — Ambulatory Visit: Payer: Medicare Other | Admitting: Gastroenterology

## 2018-09-09 ENCOUNTER — Other Ambulatory Visit: Payer: Self-pay | Admitting: Family Medicine

## 2018-09-09 DIAGNOSIS — F322 Major depressive disorder, single episode, severe without psychotic features: Secondary | ICD-10-CM

## 2018-09-11 ENCOUNTER — Other Ambulatory Visit: Payer: Self-pay

## 2018-09-11 DIAGNOSIS — F322 Major depressive disorder, single episode, severe without psychotic features: Secondary | ICD-10-CM

## 2018-09-11 MED ORDER — SERTRALINE HCL 25 MG PO TABS: 25.0000 mg | ORAL_TABLET | Freq: Every day | ORAL | 0 refills | Status: DC

## 2018-09-13 ENCOUNTER — Other Ambulatory Visit: Payer: Self-pay

## 2018-09-13 ENCOUNTER — Ambulatory Visit (INDEPENDENT_AMBULATORY_CARE_PROVIDER_SITE_OTHER): Payer: Medicaid Other | Admitting: Licensed Clinical Social Worker

## 2018-09-13 DIAGNOSIS — F331 Major depressive disorder, recurrent, moderate: Secondary | ICD-10-CM

## 2018-09-13 NOTE — Progress Notes (Signed)
Patient ID: Emily Butler, female   DOB: February 21, 1956, 63 y.o.   MRN: [id number]  Virtual Visit via Video Note  I connected with Emily Butler on 09/13/18 at 11:00 AM EDT by a video enabled telemedicine application and verified that I am speaking with the correct person using two identifiers.  I discussed the limitations of evaluation and management by telemedicine and the availability of in person appointments. The patient expressed understanding and agreed to proceed.  I discussed the assessment and treatment plan with the patient. The patient was provided an opportunity to ask questions and all were answered. The patient agreed with the plan and demonstrated an understanding of the instructions.   The patient was advised to call back or seek an in-person evaluation if the symptoms worsen or if the condition fails to improve as anticipated.  Type of Therapy: Individual Therapy  Treatment Goals addressed:  Elevate mood to show usual levels of energy, activity, and socialization.   Interventions:Motivational Interviewing, Supportive Counseling  Summary: Emily Butler is a 63 y.o. female who presents with symptoms of low energy and motivation, depressed mood, tendency to isolate, tearfulness, irritability.  Therapist Response:  Patient met with clinician for an individual session. Patient reports that she has been thinking about what she wants and what would make her happy. She struggles to identify goals or benchmarks. Counselor challenged patient to identify the last time she felt happy, and explored with her the differences between then and now. Patient shared that the first 2 years she moved here she was happy, but once her cat went missing and her dog died things began going downhill. She shared about recent interactions with her children and stated that she saw her pets more as family than her children. In addition, she stated that she tends to get stuck in thoughts of the past. Counselor guided  patient in processing relationships with children past and present.  Patient indicates that she feels like her whole life is being judged by a decision she made in the most difficult time in her life (leaving the family). She emphasized that she would have taken all of her children, and wanted to, but she was scared of putting them through the custody battle. Counselor explored with patient the ways that she builds stories about herself and her worth in her mind, and emphasized ways that choices and situations that happen get integrated into those stories. Patient and counselor discussed ways that these situations can be distorted into evidence for the negative or depressive stories we have.   Suicidal/Homicidal:  No   Recommendation and plan:  Counseling sessions every other week  Diagnosis:  Major Depressive Disorder, Recurrent, Moderate  I provided 54 minutes of non-face-to-face time during this encounter.   Lillie Fragmin, LCSW

## 2018-09-26 ENCOUNTER — Ambulatory Visit (INDEPENDENT_AMBULATORY_CARE_PROVIDER_SITE_OTHER): Payer: Medicaid Other | Admitting: Licensed Clinical Social Worker

## 2018-09-26 DIAGNOSIS — F331 Major depressive disorder, recurrent, moderate: Secondary | ICD-10-CM | POA: Diagnosis not present

## 2018-09-26 NOTE — Progress Notes (Signed)
Patient ID: NYSA SARIN, female   DOB: 02/20/1956, 63 y.o.   MRN: [id number]  Virtual Visit via Video Note  I connected with [name] on 09/26/18 at  1:00 PM EDT by a video enabled telemedicine application and verified that I am speaking with the correct person using two identifiers.   I discussed the limitations of evaluation and management by telemedicine and the availability of in person appointments. The patient expressed understanding and agreed to proceed.  I discussed the assessment and treatment plan with the patient. The patient was provided an opportunity to ask questions and all were answered. The patient agreed with the plan and demonstrated an understanding of the instructions.   The patient was advised to call back or seek an in-person evaluation if the symptoms worsen or if the condition fails to improve as anticipated.  Type of Therapy: Individual Therapy  Treatment Goals addressed:  Elevate mood to show usual levels of energy, activity, and socialization.   Interventions:  Reality Therapy, Supportive Counseling  Summary: Raiden is a 63 y.o. female who presents with symptoms of symptoms of low energy and motivation, depressed mood, tendency to isolate, tearfulness, irritability.  Therapist Response:  Patient met with clinician for an individual session. Patient reports that she was feeling pretty blue after the last session as she was thinking about the numbness of her feelings. Counselor normalized the difficult emotions involved with processing things that come up in therapy sessions. Patient processed with counselor her emotional reactions to her reflections. She stated again that she feels as though everyone judges her and her worth by the one worst/most difficult decision she made. Counselor guided patient to explore whether she would make the same situation again if she were in that same place/time now. Patient shared about the situation when she became pregnant  with her youngest daughter, stating that she had come close to suicide and had also considered/fanasized about shooting her husband in the head. However, if she were to make the decision now she reports that rather than leaving she would have stayed in the marriage, just lived her life separately from his. Counselor explored with patient whether, at that time in her life, she would have been able to stay in the situation without hurting herself or her husband. Patient acknowledged that she would probably have killed herself had she stayed. Counselor emphasized that patient made the best choice she could for herself and her children at that time. Patient was able to understand this, and stated that her oldest daughter (in high school at the time that patient left) has told her that things started getting better after she left because the tension between patient and ex-husband had been so bad that the family couldn't have peace with them both there. Patient discussed the ways that her children, now grown, often blame things that go wrong on her leaving all those years ago. Counselor validated patient's pain in this, and emphasized to her that all she can control is her own reactions. Patient and counselor explored ways that patient has chosen to punish herself for this over the years.   Suicidal/Homicidal:  No   Recommendation and plan:  Continued sessions every other week  Diagnosis:  Major Depressive Disorder, Recurrent, Moderate  I provided 54 minutes of non-face-to-face time during this encounter.   Lillie Fragmin, LCSW

## 2018-10-10 ENCOUNTER — Ambulatory Visit (HOSPITAL_COMMUNITY): Payer: Medicare Other | Admitting: Licensed Clinical Social Worker

## 2018-10-17 ENCOUNTER — Other Ambulatory Visit: Payer: Self-pay | Admitting: Family Medicine

## 2018-10-17 DIAGNOSIS — F322 Major depressive disorder, single episode, severe without psychotic features: Secondary | ICD-10-CM

## 2018-10-17 NOTE — Telephone Encounter (Signed)
Requested medications are due for refill today?  Yes  Requested medications are on the active medication list?  Yes  Last refill - 09/11/2018- #30, 0 refills   Future visit scheduled?  Yes - 10/18/2018  Notes to clinic:  Patient given #30, 0 refills on 09/11/2018 by PCP- she was scheduled to follow up with psychiatry.  Will forward to PCP to see if she plans to refill medication or if patient was supposed to discuss with psychiatry.  Patient has appointment with PCP 10/18/2018.    Requested Prescriptions  Pending Prescriptions Disp Refills   sertraline (ZOLOFT) 25 MG tablet [Pharmacy Med Name: SERTRALINE HCL 25 MG TABLET] 30 tablet 0    Sig: TAKE 1 TABLET BY MOUTH DAILY.     Psychiatry:  Antidepressants - SSRI Passed - 10/17/2018  3:19 PM      Passed - Valid encounter within last 6 months    Recent Outpatient Visits          2 months ago Hepatic cirrhosis, unspecified hepatic cirrhosis type, unspecified whether ascites present [facility])   [place], FNP   2 months ago Gastroesophageal reflux disease without esophagitis   [place], [place]      Future Appointments            Tomorrow [facility], [place] - Completed PHQ-2 or PHQ-9 in the last 360 days.

## 2018-10-18 ENCOUNTER — Encounter: Payer: Self-pay | Admitting: Family Medicine

## 2018-10-18 ENCOUNTER — Ambulatory Visit (INDEPENDENT_AMBULATORY_CARE_PROVIDER_SITE_OTHER): Payer: Medicare Other | Admitting: Family Medicine

## 2018-10-18 ENCOUNTER — Other Ambulatory Visit: Payer: Self-pay

## 2018-10-18 ENCOUNTER — Other Ambulatory Visit: Payer: Self-pay | Admitting: Family Medicine

## 2018-10-18 DIAGNOSIS — M79661 Pain in right lower leg: Secondary | ICD-10-CM

## 2018-10-18 DIAGNOSIS — G4700 Insomnia, unspecified: Secondary | ICD-10-CM

## 2018-10-18 DIAGNOSIS — F322 Major depressive disorder, single episode, severe without psychotic features: Secondary | ICD-10-CM

## 2018-10-18 DIAGNOSIS — G2581 Restless legs syndrome: Secondary | ICD-10-CM

## 2018-10-18 DIAGNOSIS — M79662 Pain in left lower leg: Secondary | ICD-10-CM

## 2018-10-18 MED ORDER — TRAZODONE HCL 50 MG PO TABS: 25.0000 mg | ORAL_TABLET | Freq: Every evening | ORAL | 3 refills | Status: DC | PRN

## 2018-10-18 MED ORDER — SERTRALINE HCL 25 MG PO TABS: 25.0000 mg | ORAL_TABLET | Freq: Every day | ORAL | 1 refills | Status: DC

## 2018-10-18 NOTE — Progress Notes (Signed)
Name: Emily Butler   MRN: [id number]    DOB: 06-04-1955   Date:10/18/2018       Progress Note  Subjective  Chief Complaint  Chief Complaint  Patient presents with  . Leg Pain    feels like toothache below the knee, bilateral  . Medication Refill    I connected with  [name] on 10/18/18 at 10:20 AM EDT by telephone and verified that I am speaking with the correct person using two identifiers.   I discussed the limitations, risks, security and privacy concerns of performing an evaluation and management service by telephone and the availability of in person appointments. Staff also discussed with the patient that there may be a patient responsible charge related to this service. Patient Location: Home Provider Location: Home Additional Individuals present: None  HPI  Pt presents with concern for bilateral calf cramping.  She notes a history of the same in the past when she exercised too much.  She notes the current episode started about 4-5 months ago when she started walking again, and she has had slowly worsening pain over the last 2 weeks.  The pain is described as aching, present from the knee down, and is worse after exercise and in the evenings.  Heat has not helped, but ice/cool soaks have really helped.  She notes that she started taking a multivitamin and drinking more water last week and now she is feeling a lot better.  Denies swelling, redness, tenderness to the calves.    Insomnia/RLS: She is struggling with sleep; she has tried benadryl, advil PM all without relief of her symptoms.  She has been going to counseling for her depression, but she still has some racing thoughts at night.  Has tried temazepam in the past and did not do well with it.    Patient Active Problem List   Diagnosis Date Noted  . MDD (major depressive disorder) 06/28/2018  . Elevated fasting blood sugar 11/06/2014  . Chronic kidney disease, unspecified 08/13/2013  . GERD (gastroesophageal  reflux disease) 08/13/2013  . Restless legs syndrome (RLS) 05/23/2013  . Umbilical hernia 47/42/5956  . Atrial fibrillation ([place]) 07/07/2012  . Cirrhosis ([place]) 07/07/2012  . Former smoker 07/07/2012  . Hypertension 07/07/2012  . Chronic hepatitis C ([place]) 07/07/2012  . ABNORMAL RESULT, FUNCTION STUDY, LIVER 12/23/2006  . THROMBOCYTOPENIA 12/20/2006    Social History   Tobacco Use  . Smoking status: Former Research scientist (life sciences)  . Smokeless tobacco: Never Used  Substance Use Topics  . 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  aspirin EC 81 MG tablet, Take 81 mg by mouth daily., Disp: , Rfl:  .  ferrous sulfate 325 (65 FE) MG tablet, Take by mouth., Disp: , Rfl:  .  furosemide (LASIX) 40 MG tablet, Take 1 tablet (40 mg total) by mouth daily., Disp: 90 tablet, Rfl: 1 .  losartan (COZAAR) 25 MG tablet, Take 1 tablet (25 mg total) by mouth daily., Disp: 90 tablet, Rfl: 1 .  Multiple Vitamin (MULTI-VITAMIN) tablet, Take by mouth., Disp: , Rfl:  .  omeprazole (PRILOSEC) 20 MG capsule, Take 1 capsule (20 mg total) by mouth daily., Disp: 90 capsule, Rfl: 1 .  propranolol (INDERAL) 40 MG tablet, Take 1 tablet (40 mg total) by mouth 2 (two) times daily., Disp: 180 tablet, Rfl: 1 .  sertraline (ZOLOFT) 25 MG tablet, Take 1 tablet (25 mg total) by  mouth daily., Disp: 30 tablet, Rfl: 0 .  simethicone (GAS-X) 80 MG chewable tablet, Chew 1 tablet (80 mg total) by mouth every 6 (six) hours as needed for flatulence., Disp: 30 tablet, Rfl: 0  Allergies  Allergen Reactions  . Codeine     REACTION: GI upset    I personally reviewed active problem list, medication list, allergies, notes from last encounter, lab results with the patient/caregiver today.  ROS  Constitutional: Negative for fever or weight change.  Respiratory: Negative for cough and shortness of breath.   Cardiovascular: Negative for chest pain or palpitations.  Gastrointestinal:  Negative for abdominal pain, no bowel changes.  Musculoskeletal: Negative for gait problem or joint swelling.  Skin: Negative for rash.  Neurological: Negative for dizziness or headache.  No other specific complaints in a complete review of systems (except as listed in HPI above).  Objective  Virtual encounter, vitals not obtained.  There is no height or weight on file to calculate BMI.  Nursing Note and Vital Signs reviewed.  Physical Exam  Constitutional: Patient appears well-developed and well-nourished. No distress.  HENT: Head: Normocephalic and atraumatic.  Neck: Normal range of motion. Pulmonary/Chest: Effort normal. No respiratory distress. Speaking in complete sentences Neurological: Pt is alert and oriented to person, place, and time. Coordination, speech are normal. Psychiatric: Patient has a normal mood and affect. behavior is normal. Judgment and thought content normal.  No results found for this or any previous visit (from the past 72 hour(s)).  Assessment & Plan  1. Bilateral calf pain - Resolved on its own; monitor and will consider labs if symptoms return.  2. Insomnia, unspecified type - Trial of Trazodone - traZODone (DESYREL) 50 MG tablet; Take 0.5-1 tablets (25-50 mg total) by mouth at bedtime as needed for sleep.  Dispense: 30 tablet; Refill: 3 - Sleep hygiene discussed in detail.  3. Current severe episode of major depressive disorder without psychotic features, unspecified whether recurrent (HCC) - traZODone (DESYREL) 50 MG tablet; Take 0.5-1 tablets (25-50 mg total) by mouth at bedtime as needed for sleep.  Dispense: 30 tablet; Refill: 3 - sertraline (ZOLOFT) 25 MG tablet; Take 1 tablet (25 mg total) by mouth daily.  Dispense: 90 tablet; Refill: 1  4. Restless legs syndrome (RLS) - traZODone (DESYREL) 50 MG tablet; Take 0.5-1 tablets (25-50 mg total) by mouth at bedtime as needed for sleep.  Dispense: 30 tablet; Refill: 3  -Red flags and when to  present for emergency care or RTC including fever >101.75F, chest pain, shortness of breath, new/worsening/un-resolving symptoms,  reviewed with patient at time of visit. Follow up and care instructions discussed and provided in AVS. - I discussed the assessment and treatment plan with the patient. The patient was provided an opportunity to ask questions and all were answered. The patient agreed with the plan and demonstrated an understanding of the instructions.  - The patient was advised to call back or seek an in-person evaluation if the symptoms worsen or if the condition fails to improve as anticipated.  I provided 17 minutes of non-face-to-face time during this encounter.  Doren CustardEmily E Chauncy Mangiaracina, FNP

## 2018-10-26 ENCOUNTER — Ambulatory Visit (INDEPENDENT_AMBULATORY_CARE_PROVIDER_SITE_OTHER): Payer: Medicare Other | Admitting: Licensed Clinical Social Worker

## 2018-10-26 DIAGNOSIS — F331 Major depressive disorder, recurrent, moderate: Secondary | ICD-10-CM

## 2018-10-30 NOTE — Progress Notes (Signed)
na

## 2018-11-10 ENCOUNTER — Ambulatory Visit: Payer: Medicare Other | Admitting: Family Medicine

## 2018-11-10 ENCOUNTER — Other Ambulatory Visit: Payer: Self-pay | Admitting: Family Medicine

## 2018-11-10 ENCOUNTER — Other Ambulatory Visit: Payer: Self-pay

## 2018-11-10 DIAGNOSIS — G4700 Insomnia, unspecified: Secondary | ICD-10-CM

## 2018-11-10 DIAGNOSIS — G2581 Restless legs syndrome: Secondary | ICD-10-CM

## 2018-11-10 DIAGNOSIS — F322 Major depressive disorder, single episode, severe without psychotic features: Secondary | ICD-10-CM

## 2018-11-10 NOTE — Telephone Encounter (Signed)
Requested medication (s) are due for refill today: yes  Requested medication (s) are on the active medication list: yes  Last refill:  10/18/2018  Future visit scheduled: no  Notes to clinic:  Requesting 90 day supply  Requested Prescriptions  Pending Prescriptions Disp Refills   traZODone (DESYREL) 50 MG tablet [Pharmacy Med Name: TRAZODONE 50 MG TABLET] 90 tablet 2    Sig: Take 0.5-1 tablets (25-50 mg total) by mouth at bedtime as needed for sleep.     Psychiatry: Antidepressants - Serotonin Modulator Passed - 11/10/2018  8:30 AM      Passed - Valid encounter within last 6 months    Recent Outpatient Visits          3 weeks ago Bilateral calf pain   [place], [place], FNP   2 months ago Hepatic cirrhosis, unspecified hepatic cirrhosis type, unspecified whether ascites present [facility])   [place], FNP   3 months ago Gastroesophageal reflux disease without esophagitis   [place], [place]             Passed - Completed PHQ-2 or PHQ-9 in the last 360 days.

## 2018-11-16 ENCOUNTER — Telehealth: Payer: Self-pay | Admitting: Emergency Medicine

## 2018-11-16 DIAGNOSIS — F322 Major depressive disorder, single episode, severe without psychotic features: Secondary | ICD-10-CM

## 2018-11-16 NOTE — Telephone Encounter (Signed)
Is it ok to proceed with this referral? 

## 2018-11-16 NOTE — Telephone Encounter (Signed)
Copied from [place] [id number]. Topic: Referral - Request for Referral >> Nov 15, 2018  2:19 PM Erick Blinks wrote: Has patient seen PCP for this complaint? Yes.   *If NO, is insurance requiring patient see PCP for this issue before PCP can refer them? Referral for which specialty: Psychiatry  Preferred provider/office: Dr. Thomasena Edis, [facility]  Reason for referral: Needed

## 2018-11-16 NOTE — Telephone Encounter (Signed)
Referral is placed.  Please proceed. Thanks!

## 2018-11-16 NOTE — Telephone Encounter (Signed)
Referral has been process as requested.

## 2018-12-07 ENCOUNTER — Other Ambulatory Visit: Payer: Self-pay

## 2018-12-07 ENCOUNTER — Encounter: Payer: Self-pay | Admitting: Family Medicine

## 2018-12-07 ENCOUNTER — Ambulatory Visit (INDEPENDENT_AMBULATORY_CARE_PROVIDER_SITE_OTHER): Payer: Medicare Other | Admitting: Family Medicine

## 2018-12-07 DIAGNOSIS — G4700 Insomnia, unspecified: Secondary | ICD-10-CM

## 2018-12-07 DIAGNOSIS — G2581 Restless legs syndrome: Secondary | ICD-10-CM

## 2018-12-07 DIAGNOSIS — D696 Thrombocytopenia, unspecified: Secondary | ICD-10-CM

## 2018-12-07 DIAGNOSIS — R197 Diarrhea, unspecified: Secondary | ICD-10-CM

## 2018-12-07 DIAGNOSIS — K219 Gastro-esophageal reflux disease without esophagitis: Secondary | ICD-10-CM | POA: Diagnosis not present

## 2018-12-07 DIAGNOSIS — F322 Major depressive disorder, single episode, severe without psychotic features: Secondary | ICD-10-CM

## 2018-12-07 DIAGNOSIS — K7460 Unspecified cirrhosis of liver: Secondary | ICD-10-CM

## 2018-12-07 DIAGNOSIS — R7301 Impaired fasting glucose: Secondary | ICD-10-CM | POA: Diagnosis not present

## 2018-12-07 DIAGNOSIS — N189 Chronic kidney disease, unspecified: Secondary | ICD-10-CM

## 2018-12-07 DIAGNOSIS — I4891 Unspecified atrial fibrillation: Secondary | ICD-10-CM

## 2018-12-07 MED ORDER — LOPERAMIDE HCL 2 MG PO TABS: 2.0000 mg | ORAL_TABLET | Freq: Four times a day (QID) | ORAL | 0 refills | Status: DC | PRN

## 2018-12-07 NOTE — Progress Notes (Signed)
Name: Emily Butler   MRN: [id number]    DOB: 11-17-1955   Date:12/07/2018       Progress Note  Subjective  Chief Complaint  Chief Complaint  Patient presents with  . Follow-up    I connected with  Gearlean Alf  on 12/07/18 at 12:40 PM EDT by a video enabled telemedicine application and verified that I am speaking with the correct person using two identifiers.  I discussed the limitations of evaluation and management by telemedicine and the availability of in person appointments. The patient expressed understanding and agreed to proceed. Staff also discussed with the patient that there may be a patient responsible charge related to this service. Patient Location: Home Provider Location: Office Additional Individuals present: None  HPI  GERD: She is doing okay with her symptoms - needing to take Prilosec 1-2 times a day as needed.  She has been eating some spicy foods which are a trigger.  She is aware of long term affects of PPI use.    Cirrhosis/History Hep C: She took Harvoni in 2016 which treated her Hep C, but she still has cirrhosis. She does not have a GI doctor that follows her for this and does not want referral at this time - she will call back if she follow up.  Most recent CMP showed normal LFT's. Denies abdominal pain, scleral icterus, or jaundice.  Does get occasional diarrhea - normal brown color.  IFG: Has history of IFG. No polyuria, polydipsia, or polyphagia.  Last A1C was normal. Unchanged.  MDD: She has history of ETOH abuse, was on Lexapro and Wellbutrin, however her prior PCP refused to fill due to concern for hallucinations and SI, and their request for her to see [facility]. She weaned herself off of the medications. She has history of hallucinations and SI, but is currently denying SI and is feeling fairly well.  There are no auditory hallucinations at this time.We referred at last visit, has appointment with Dr. Elna Breslow in a few days for medication management;  taking Zoloft.  Insomnia/RLS: She is struggling with sleep; she has tried benadryl, advil PM all without relief of her symptoms.  She has been going to counseling for her depression, but she still has some racing thoughts at night.  Has tried temazepam in the past and did not do well with it; tried trazodone after last visit and this did not work for her - caused her to have mind racing.  She notes RLS is improved since exercising more.  Overweight: Has been walking a lot more, doing much better with her back pain now that she is staying in motion each day.  Diet has been balanced.   Hx Afib: Diagnosed in 2011.  Was on atenolol, then switched to propanolol (unclear why the switch was made) She is taking this as prescribed.  Still has very occasional flutter in the chest - occurs maybe once or twice a week.  She denies history of heart failure, but does endorse occasional swelling, so she takes Lasix  once daily. Taking aspirin daily. Saw a cardiologist in the past, but was told no need for routine follow up.  Never took blood thinner.  Declines referral at this time  HTN/CKD - She is not able to check BP's at home.   - does take medications as prescribed - current regimen includes Losartan, Lasix, propanolol.  - taking medications as instructed, no medication side effects noted, no TIAs, no chest pain on exertion, no dyspnea  on exertion, no swelling of ankles as long as she takes her lasix, no palpitations. - DASH diet discussed - pt does not follow a low sodium diet. - Last CMP showed normal kidney function; did have mild hypokalemia at 3.2- needs to have recheck - has been eating higher potassium diet.  Thrombocytopenia: Normal at last check; she feels very good with her energy levels.  Patient Active Problem List   Diagnosis Date Noted  . MDD (major depressive disorder) 06/28/2018  . Elevated fasting blood sugar 11/06/2014  . Chronic kidney disease, unspecified 08/13/2013  . GERD  (gastroesophageal reflux disease) 08/13/2013  . Restless legs syndrome (RLS) 05/23/2013  . Umbilical hernia 16/11/9602  . Atrial fibrillation ([place]) 07/07/2012  . Cirrhosis ([place]) 07/07/2012  . Former smoker 07/07/2012  . Hypertension 07/07/2012  . Chronic hepatitis C ([place]) 07/07/2012  . ABNORMAL RESULT, FUNCTION STUDY, LIVER 12/23/2006  . THROMBOCYTOPENIA 12/20/2006    Past Surgical History:  Procedure Laterality Date  . CESAREAN SECTION      Family History  Problem Relation Age of Onset  . Alcohol abuse Mother   . Drug abuse Mother   . Heart disease Father     Social History   Socioeconomic History  . Marital status: Divorced    Spouse name: Not on file  . Number of children: 4  . Years of education: Not on file  . Highest education level: Not on file  Occupational History  . Occupation: disability  Social Needs  . Financial resource strain: Not hard at all  . Food insecurity    Worry: Never true    Inability: Never true  . Transportation needs    Medical: Yes    Non-medical: Yes  Tobacco Use  . Smoking status: Former Research scientist (life sciences)  . Smokeless tobacco: Never Used  Substance and Sexual Activity  . Alcohol use: Not Currently    Comment: Alcoholic quit 10 years ago  . Drug use: Never  . Sexual activity: Not Currently  Lifestyle  . Physical activity    Days per week: 5 days    Minutes per session: 40 min  . Stress: To some extent  Relationships  . Social connections    Talks on phone: More than three times a week    Gets together: More than three times a week    Attends religious service: Never    Active member of club or organization: No    Attends meetings of clubs or organizations: Never    Relationship status: Divorced  . Intimate partner violence    Fear of current or ex partner: No    Emotionally abused: No    Physically abused: No    Forced sexual activity: No  Other Topics Concern  . Not on file  Social History Narrative  . Not on file      Current Outpatient Medications:  .  aspirin EC 81 MG tablet, Take 81 mg by mouth daily., Disp: , Rfl:  .  ferrous sulfate 325 (65 FE) MG tablet, Take by mouth., Disp: , Rfl:  .  losartan (COZAAR) 25 MG tablet, Take 1 tablet (25 mg total) by mouth daily., Disp: 90 tablet, Rfl: 1 .  Multiple Vitamin (MULTI-VITAMIN) tablet, Take by mouth., Disp: , Rfl:  .  omeprazole (PRILOSEC) 20 MG capsule, Take 1 capsule (20 mg total) by mouth daily., Disp: 90 capsule, Rfl: 1 .  propranolol (INDERAL) 40 MG tablet, Take 1 tablet (40 mg total) by mouth 2 (two) times daily., Disp: 180  tablet, Rfl: 1 .  sertraline (ZOLOFT) 25 MG tablet, Take 1 tablet (25 mg total) by mouth daily., Disp: 90 tablet, Rfl: 1 .  simethicone (GAS-X) 80 MG chewable tablet, Chew 1 tablet (80 mg total) by mouth every 6 (six) hours as needed for flatulence., Disp: 30 tablet, Rfl: 0 .  traZODone (DESYREL) 50 MG tablet, TAKE 0.5-1 TABLETS (25-50 MG TOTAL) BY MOUTH AT BEDTIME AS NEEDED FOR SLEEP., Disp: 90 tablet, Rfl: 0 .  furosemide (LASIX) 40 MG tablet, Take 1 tablet (40 mg total) by mouth daily., Disp: 90 tablet, Rfl: 1  Allergies  Allergen Reactions  . Codeine     REACTION: GI upset    I personally reviewed active problem list, medication list, allergies, health maintenance, notes from last encounter, lab results with the patient/caregiver today.   ROS  Constitutional: Negative for fever or weight change.  Respiratory: Negative for cough and shortness of breath.   Cardiovascular: Negative for chest pain or palpitations.  Gastrointestinal: Negative for abdominal pain, no bowel changes.  Musculoskeletal: Negative for gait problem or joint swelling.  Skin: Negative for rash.  Neurological: Negative for dizziness or headache.  No other specific complaints in a complete review of systems (except as listed in HPI above).  Objective  Virtual encounter, vitals not obtained.  There is no height or weight on file to calculate BMI.   Physical Exam  Pulmonary/Chest: Effort normal. No respiratory distress. Speaking in complete sentences Neurological: Pt is alert and oriented to person, place, and time. Speech is normal. Psychiatric: Patient has a normal mood and affect. behavior is normal. Judgment and thought content normal.  No results found for this or any previous visit (from the past 72 hour(s)).  PHQ2/9: Depression screen [facility] 12/07/2018 10/18/2018 08/17/2018 08/11/2018  Decreased Interest 0 2 0 0  Down, Depressed, Hopeless 0 2 0 0  PHQ - 2 Score 0 4 0 0  Altered sleeping 0 2 2 3   Tired, decreased energy 0 2 0 2  Change in appetite 0 0 0 0  Feeling bad or failure about yourself  0 3 0 3  Trouble concentrating 0 0 0 0  Moving slowly or fidgety/restless 0 0 0 0  Suicidal thoughts 0 0 0 0  PHQ-9 Score 0 11 2 8   Difficult doing work/chores Not difficult at all Somewhat difficult Not difficult at all Somewhat difficult   PHQ-2/9 Result is negative.    Fall Risk: Fall Risk  12/07/2018 10/18/2018 08/17/2018 08/11/2018  Falls in the past year? 0 0 0 0  Number falls in past yr: 0 0 0 0  Injury with Fall? 0 0 0 0  Follow up Falls evaluation completed Falls evaluation completed Falls evaluation completed Falls evaluation completed   Assessment & Plan  1. Gastroesophageal reflux disease without esophagitis - Prilosec PRN  2. Diarrhea, unspecified type - loperamide (IMODIUM A-D) 2 MG tablet; Take 1 tablet (2 mg total) by mouth 4 (four) times daily as needed for diarrhea or loose stools.  Dispense: 30 tablet; Refill: 0  3. Hepatic cirrhosis, unspecified hepatic cirrhosis type, unspecified whether ascites present (HCC) - Declines GI referral today. - loperamide (IMODIUM A-D) 2 MG tablet; Take 1 tablet (2 mg total) by mouth 4 (four) times daily as needed for diarrhea or loose stools.  Dispense: 30 tablet; Refill: 0  4. IFG (impaired fasting glucose) - A1C was normal.  5. Current severe episode of major depressive  disorder without psychotic features, unspecified whether recurrent (HCC) -  She is taking Zoloft; has upcoming appt with Dr. Elna Breslow  6. Insomnia, unspecified type - Needs to discuss with Dr. Elna Breslow  7. Restless legs syndrome (RLS) - Much improved with more exercise.  8. Atrial fibrillation, unspecified type (HCC) - Taking propanolol  9. Chronic kidney disease, unspecified CKD stage - Taking ARB; last labs were normal  10. THROMBOCYTOPENIA - Last labs showed normal platelets.   I discussed the assessment and treatment plan with the patient. The patient was provided an opportunity to ask questions and all were answered. The patient agreed with the plan and demonstrated an understanding of the instructions.  The patient was advised to call back or seek an in-person evaluation if the symptoms worsen or if the condition fails to improve as anticipated.  I provided 26 minutes of non-face-to-face time during this encounter.

## 2018-12-11 ENCOUNTER — Encounter: Payer: Self-pay | Admitting: Psychiatry

## 2018-12-11 ENCOUNTER — Other Ambulatory Visit: Payer: Self-pay

## 2018-12-11 ENCOUNTER — Ambulatory Visit (INDEPENDENT_AMBULATORY_CARE_PROVIDER_SITE_OTHER): Payer: Medicare Other | Admitting: Psychiatry

## 2018-12-11 DIAGNOSIS — F330 Major depressive disorder, recurrent, mild: Secondary | ICD-10-CM | POA: Diagnosis not present

## 2018-12-11 DIAGNOSIS — F1021 Alcohol dependence, in remission: Secondary | ICD-10-CM
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
## 2018-12-21 ENCOUNTER — Ambulatory Visit (INDEPENDENT_AMBULATORY_CARE_PROVIDER_SITE_OTHER): Payer: Medicare Other

## 2018-12-21 DIAGNOSIS — Z0000 Encounter for general adult medical examination without abnormal findings: Secondary | ICD-10-CM | POA: Diagnosis not present

## 2018-12-21 NOTE — Patient Instructions (Signed)
[name] , Thank you for taking time to come for your Medicare Wellness Visit. I appreciate your ongoing commitment to your health goals. Please review the following plan we discussed and let me know if I can assist you in the future.   Screening recommendations/referrals: Colonoscopy: done 12/03/13. Repeat in 2025  Mammogram: Please call [phone] to schedule your mammogram.   Recommended yearly ophthalmology/optometry visit for glaucoma screening and checkup Recommended yearly dental visit for hygiene and checkup  Vaccinations: Influenza vaccine: due Pneumococcal vaccine: done 11/06/14 Tdap vaccine: done 08/10/12 Shingles vaccine: Shingrix discussed. Please contact your pharmacy for coverage information.   Advanced directives: Advance directive discussed with you today. I have provided a copy for you to complete at home and have notarized. Once this is complete please bring a copy in to our office so we can scan it into your chart.  Conditions/risks identified: Recommend continuing healthy eating and physical activity for desired weight loss  Next appointment: Please follow up in one year for your Medicare Annual Wellness visit.    Preventive Care 40-64 Years, Female Preventive care refers to lifestyle choices and visits with your health care provider that can promote health and wellness. What does preventive care include?  A yearly physical exam. This is also called an annual well check.  Dental exams once or twice a year.  Routine eye exams. Ask your health care provider how often you should have your eyes checked.  Personal lifestyle choices, including:  Daily care of your teeth and gums.  Regular physical activity.  Eating a healthy diet.  Avoiding tobacco and drug use.  Limiting alcohol use.  Practicing safe sex.  Taking low-dose aspirin daily starting at age 70.  Taking vitamin and mineral supplements as recommended by your health care provider. What happens  during an annual well check? The services and screenings done by your health care provider during your annual well check will depend on your age, overall health, lifestyle risk factors, and family history of disease. Counseling  Your health care provider may ask you questions about your:  Alcohol use.  Tobacco use.  Drug use.  Emotional well-being.  Home and relationship well-being.  Sexual activity.  Eating habits.  Work and work Statistician.  Method of birth control.  Menstrual cycle.  Pregnancy history. Screening  You may have the following tests or measurements:  Height, weight, and BMI.  Blood pressure.  Lipid and cholesterol levels. These may be checked every 5 years, or more frequently if you are over 74 years old.  Skin check.  Lung cancer screening. You may have this screening every year starting at age 33 if you have a 30-pack-year history of smoking and currently smoke or have quit within the past 15 years.  Fecal occult blood test (FOBT) of the stool. You may have this test every year starting at age 56.  Flexible sigmoidoscopy or colonoscopy. You may have a sigmoidoscopy every 5 years or a colonoscopy every 10 years starting at age 88.  Hepatitis C blood test.  Hepatitis B blood test.  Sexually transmitted disease (STD) testing.  Diabetes screening. This is done by checking your blood sugar (glucose) after you have not eaten for a while (fasting). You may have this done every 1-3 years.  Mammogram. This may be done every 1-2 years. Talk to your health care provider about when you should start having regular mammograms. This may depend on whether you have a family history of breast cancer.  BRCA-related cancer screening.  This may be done if you have a family history of breast, ovarian, tubal, or peritoneal cancers.  Pelvic exam and Pap test. This may be done every 3 years starting at age 40. Starting at age 20, this may be done every 5 years if you  have a Pap test in combination with an HPV test.  Bone density scan. This is done to screen for osteoporosis. You may have this scan if you are at high risk for osteoporosis. Discuss your test results, treatment options, and if necessary, the need for more tests with your health care provider. Vaccines  Your health care provider may recommend certain vaccines, such as:  Influenza vaccine. This is recommended every year.  Tetanus, diphtheria, and acellular pertussis (Tdap, Td) vaccine. You may need a Td booster every 10 years.  Zoster vaccine. You may need this after age 66.  Pneumococcal 13-valent conjugate (PCV13) vaccine. You may need this if you have certain conditions and were not previously vaccinated.  Pneumococcal polysaccharide (PPSV23) vaccine. You may need one or two doses if you smoke cigarettes or if you have certain conditions. Talk to your health care provider about which screenings and vaccines you need and how often you need them. This information is not intended to replace advice given to you by your health care provider. Make sure you discuss any questions you have with your health care provider. Document Released: 03/14/2015 Document Revised: 11/05/2015 Document Reviewed: 12/17/2014 Elsevier Interactive Patient Education  2017 [place] Prevention in the Home Falls can cause injuries. They can happen to people of all ages. There are many things you can do to make your home safe and to help prevent falls. What can I do on the outside of my home?  Regularly fix the edges of walkways and driveways and fix any cracks.  Remove anything that might make you trip as you walk through a door, such as a raised step or threshold.  Trim any bushes or trees on the path to your home.  Use bright outdoor lighting.  Clear any walking paths of anything that might make someone trip, such as rocks or tools.  Regularly check to see if handrails are loose or broken.  Make sure that both sides of any steps have handrails.  Any raised decks and porches should have guardrails on the edges.  Have any leaves, snow, or ice cleared regularly.  Use sand or salt on walking paths during winter.  Clean up any spills in your garage right away. This includes oil or grease spills. What can I do in the bathroom?  Use night lights.  Install grab bars by the toilet and in the tub and shower. Do not use towel bars as grab bars.  Use non-skid mats or decals in the tub or shower.  If you need to sit down in the shower, use a plastic, non-slip stool.  Keep the floor dry. Clean up any water that spills on the floor as soon as it happens.  Remove soap buildup in the tub or shower regularly.  Attach bath mats securely with double-sided non-slip rug tape.  Do not have throw rugs and other things on the floor that can make you trip. What can I do in the bedroom?  Use night lights.  Make sure that you have a light by your bed that is easy to reach.  Do not use any sheets or blankets that are too big for your bed. They should not hang  down onto the floor.  Have a firm chair that has side arms. You can use this for support while you get dressed.  Do not have throw rugs and other things on the floor that can make you trip. What can I do in the kitchen?  Clean up any spills right away.  Avoid walking on wet floors.  Keep items that you use a lot in easy-to-reach places.  If you need to reach something above you, use a strong step stool that has a grab bar.  Keep electrical cords out of the way.  Do not use floor polish or wax that makes floors slippery. If you must use wax, use non-skid floor wax.  Do not have throw rugs and other things on the floor that can make you trip. What can I do with my stairs?  Do not leave any items on the stairs.  Make sure that there are handrails on both sides of the stairs and use them. Fix handrails that are broken or  loose. Make sure that handrails are as long as the stairways.  Check any carpeting to make sure that it is firmly attached to the stairs. Fix any carpet that is loose or worn.  Avoid having throw rugs at the top or bottom of the stairs. If you do have throw rugs, attach them to the floor with carpet tape.  Make sure that you have a light switch at the top of the stairs and the bottom of the stairs. If you do not have them, ask someone to add them for you. What else can I do to help prevent falls?  Wear shoes that:  Do not have high heels.  Have rubber bottoms.  Are comfortable and fit you well.  Are closed at the toe. Do not wear sandals.  If you use a stepladder:  Make sure that it is fully opened. Do not climb a closed stepladder.  Make sure that both sides of the stepladder are locked into place.  Ask someone to hold it for you, if possible.  Clearly mark and make sure that you can see:  Any grab bars or handrails.  First and last steps.  Where the edge of each step is.  Use tools that help you move around (mobility aids) if they are needed. These include:  Canes.  Walkers.  Scooters.  Crutches.  Turn on the lights when you go into a dark area. Replace any light bulbs as soon as they burn out.  Set up your furniture so you have a clear path. Avoid moving your furniture around.  If any of your floors are uneven, fix them.  If there are any pets around you, be aware of where they are.  Review your medicines with your doctor. Some medicines can make you feel dizzy. This can increase your chance of falling. Ask your doctor what other things that you can do to help prevent falls. This information is not intended to replace advice given to you by your health care provider. Make sure you discuss any questions you have with your health care provider. Document Released: 12/12/2008 Document Revised: 07/24/2015 Document Reviewed: 03/22/2014 Elsevier Interactive  Patient Education  2017 [employer].

## 2018-12-21 NOTE — Progress Notes (Signed)
Subjective:   Emily Butler is a 63 y.o. female who presents for an Initial Medicare Annual Wellness Visit.  Virtual Visit via Telephone Note  I connected with Emily Butler on 12/21/18 at  2:10 PM EDT by telephone and verified that I am speaking with the correct person using two identifiers.  Medicare Annual Wellness visit completed telephonically due to Covid-19 pandemic.   Location: Patient: home Provider: office    I discussed the limitations, risks, security and privacy concerns of performing an evaluation and management service by telephone and the availability of in person appointments. The patient expressed understanding and agreed to proceed.  Some vital signs may be absent or patient reported.   Clemetine Marker, LPN    Review of Systems      Cardiac Risk Factors include: hypertension     Objective:    There were no vitals filed for this visit. There is no height or weight on file to calculate BMI.  Advanced Directives 12/21/2018 06/28/2018  Does Patient Have a Medical Advance Directive? No No  Would patient like information on creating a medical advance directive? Yes (MAU/Ambulatory/Procedural Areas - Information given) -    Current Medications (verified) Outpatient Encounter Medications as of 12/21/2018  Medication Sig   ferrous sulfate 325 (65 FE) MG tablet Take by mouth.   loperamide (IMODIUM A-D) 2 MG tablet Take 1 tablet (2 mg total) by mouth 4 (four) times daily as needed for diarrhea or loose stools.   losartan (COZAAR) 25 MG tablet Take 1 tablet (25 mg total) by mouth daily.   Multiple Vitamin (MULTI-VITAMIN) tablet Take by mouth.   omeprazole (PRILOSEC) 20 MG capsule Take 1 capsule (20 mg total) by mouth daily.   propranolol (INDERAL) 40 MG tablet Take 1 tablet (40 mg total) by mouth 2 (two) times daily.   sertraline (ZOLOFT) 25 MG tablet Take 1 tablet (25 mg total) by mouth daily.   simethicone (GAS-X) 80 MG chewable tablet Chew 1 tablet  (80 mg total) by mouth every 6 (six) hours as needed for flatulence.   aspirin EC 81 MG tablet Take 81 mg by mouth daily.   furosemide (LASIX) 40 MG tablet Take 1 tablet (40 mg total) by mouth daily.   No facility-administered encounter medications on file as of 12/21/2018.     Allergies (verified) Duloxetine and Codeine   History: Past Medical History:  Diagnosis Date   Alcoholic ([place])    Cirrhosis of liver ([place])    Depression    EXCESSIVE MENSTRUATION 11/08/2006   Qualifier: Diagnosis of  By: Maxie Better FNP, Rosalita Levan    GERD (gastroesophageal reflux disease)    Hepatitis C 2011   Hypertension    Past Surgical History:  Procedure Laterality Date   CESAREAN SECTION     Family History  Problem Relation Age of Onset   Alcohol abuse Mother    Drug abuse Mother    Heart disease Father    Social History   Socioeconomic History   Marital status: Divorced    Spouse name: Not on file   Number of children: 4   Years of education: Not on file   Highest education level: Not on file  Occupational History   Occupation: disability  Social Designer, fashion/clothing strain: Not hard at all   Food insecurity    Worry: Never true    Inability: Never true   Transportation needs    Medical: Yes    Non-medical: Yes  Tobacco  Use   Smoking status: Former Smoker   Smokeless tobacco: Never Used  Substance and Sexual Activity   Alcohol use: Not Currently    Comment: Alcoholic quit 10 years ago   Drug use: Never   Sexual activity: Not Currently  Lifestyle   Physical activity    Days per week: 6 days    Minutes per session: 60 min   Stress: To some extent  Relationships   Social connections    Talks on phone: More than three times a week    Gets together: More than three times a week    Attends religious service: Never    Active member of club or organization: No    Attends meetings of clubs or organizations: Never    Relationship status:  Divorced  Other Topics Concern   Not on file  Social History Narrative   Not on file    Tobacco Counseling Counseling given: Not Answered   Clinical Intake:  Pre-visit preparation completed: Yes  Pain : No/denies pain     Nutritional Status: BMI 25 -29 Overweight Nutritional Risks: None Diabetes: No  How often do you need to have someone help you when you read instructions, pamphlets, or other written materials from your doctor or pharmacy?: 1 - Never  Interpreter Needed?: No  Information entered by :: Reather LittlerKasey Tascha Casares LPN   Activities of Daily Living In your present state of health, do you have any difficulty performing the following activities: 12/21/2018  Hearing? N  Comment declines hearing aids  Vision? N  Difficulty concentrating or making decisions? N  Walking or climbing stairs? N  Dressing or bathing? N  Doing errands, shopping? N  Preparing Food and eating ? N  Using the Toilet? N  In the past six months, have you accidently leaked urine? N  Do you have problems with loss of bowel control? N  Managing your Medications? N  Managing your Finances? N  Housekeeping or managing your Housekeeping? N  Some recent data might be hidden     Immunizations and Health Maintenance Immunization History  Administered Date(s) Administered   Hep A / Hep B 07/16/2011, 08/16/2011, 01/17/2012   Hepatitis B 08/10/2012, 10/04/2012   Hepatitis B, adult 02/14/2013   Influenza Inj Mdck Quad Pf 12/02/2016, 11/24/2017   Influenza, Seasonal, Injecte, Preservative Fre 11/14/2013   Influenza,inj,Quad PF,6+ Mos 12/24/2015   Influenza-Unspecified 11/24/2012   Pneumococcal Conjugate-13 11/06/2014   Pneumococcal Polysaccharide-23 08/10/2012   Tdap 08/10/2012   Health Maintenance Due  Topic Date Due   PAP SMEAR-Modifier  04/24/1978   MAMMOGRAM  11/20/2008   INFLUENZA VACCINE  09/30/2018    Patient Care Team: Doren CustardBoyce, Emily E, FNP as PCP - General (Family  Medicine)  Indicate any recent Medical Services you may have received from other than [facility] providers in the past year (date may be approximate).     Assessment:   This is a routine wellness examination for Emily Butler.  Hearing/Vision screen  Hearing Screening   125Hz  250Hz  500Hz  1000Hz  2000Hz  3000Hz  4000Hz  6000Hz  8000Hz   Right ear:           Left ear:           Comments: Pt declines hearing difficulty  Vision Screening Comments: Pt plans to establish care at [facility]  Dietary issues and exercise activities discussed: Current Exercise Habits: Home exercise routine, Type of exercise: walking, Time (Minutes): 60, Frequency (Times/Week): 6, Weekly Exercise (Minutes/Week): 360, Intensity: Moderate, Exercise limited by: None identified  Goals  Weight (lb) < 160 lb (72.6 kg)     Pt states she would like to continue to lose weight with diet and exercise.       Depression Screen PHQ 2/9 Scores 12/21/2018 12/07/2018 10/18/2018 08/17/2018 08/11/2018  PHQ - 2 Score 0 0 4 0 0  PHQ- 9 Score - 0 11 2 8     Fall Risk Fall Risk  12/21/2018 12/07/2018 10/18/2018 08/17/2018 08/11/2018  Falls in the past year? 0 0 0 0 0  Number falls in past yr: 0 0 0 0 0  Injury with Fall? 0 0 0 0 0  Follow up Falls prevention discussed Falls evaluation completed Falls evaluation completed Falls evaluation completed Falls evaluation completed   FALL RISK PREVENTION PERTAINING TO THE HOME:  Any stairs in or around the home? Yes  If so, do they handrails? Yes   Home free of loose throw rugs in walkways, pet beds, electrical cords, etc? Yes  Adequate lighting in your home to reduce risk of falls? Yes   ASSISTIVE DEVICES UTILIZED TO PREVENT FALLS:  Life alert? No  Use of a cane, walker or w/c? No  Grab bars in the bathroom? No  Shower chair or bench in shower? No  Elevated toilet seat or a handicapped toilet? No   DME ORDERS:  DME order needed?  No   TIMED UP AND GO:  Was the test performed? No  . Telephonic visit.   Education: Fall risk prevention has been discussed.  Intervention(s) required? No   Cognitive Function:        Screening Tests Health Maintenance  Topic Date Due   PAP SMEAR-Modifier  04/24/1978   MAMMOGRAM  11/20/2008   INFLUENZA VACCINE  09/30/2018   TETANUS/TDAP  08/11/2022   COLONOSCOPY  12/04/2023   Hepatitis C Screening  Completed   HIV Screening  Completed    Qualifies for Shingles Vaccine? Yes  . Due for Shingrix. Education has been provided regarding the importance of this vaccine. Pt has been advised to call insurance company to determine out of pocket expense. Advised may also receive vaccine at local pharmacy or Health Dept. Verbalized acceptance and understanding.  Tdap: Up to date  Flu Vaccine: Due for Flu vaccine. Does the patient want to receive this vaccine today?  No . Education has been provided regarding the importance of this vaccine but still declined. Advised may receive this vaccine at local pharmacy or Health Dept. Aware to provide a copy of the vaccination record if obtained from local pharmacy or Health Dept. Verbalized acceptance and understanding.  Pneumococcal Vaccine: Up to date   Cancer Screenings:  Colorectal Screening: Completed 12/03/13. Repeat every 10 years  Mammogram: Completed 2008. Repeat every year. Ordered 08/17/18. Pt provided with contact information and advised to call to schedule appt.   Bone Density: due age 54  Lung Cancer Screening: (Low Dose CT Chest recommended if Age 41-80 years, 30 pack-year currently smoking OR have quit w/in 15years.) does not qualify.   Lung Cancer Screening Referral: An Epic message has been sent to 78, RN (Oncology Nurse Navigator) regarding the possible need for this exam. Glenna Fellows will review the patient's chart to determine if the patient truly qualifies for the exam. If the patient qualifies, Ines Bloomer will order the Low Dose CT of the chest to facilitate the  scheduling of this exam.  Additional Screening:  Hepatitis C Screening: does qualify; Completed 07/07/12  Vision Screening: Recommended annual ophthalmology exams for early detection of glaucoma and other  disorders of the eye. Is the patient up to date with their annual eye exam?  No  Who is the provider or what is the name of the office in which the pt attends annual eye exams? Plans to establish care at [facility]  Dental Screening: Recommended annual dental exams for proper oral hygiene  Community Resource Referral:  CRR required this visit?  No       Plan:    I have personally reviewed and addressed the Medicare Annual Wellness questionnaire and have noted the following in the patients chart:  A. Medical and social history B. Use of alcohol, tobacco or illicit drugs  C. Current medications and supplements D. Functional ability and status E.  Nutritional status F.  Physical activity G. Advance directives H. List of other physicians I.  Hospitalizations, surgeries, and ER visits in previous 12 months J.  Vitals K. Screenings such as hearing and vision if needed, cognitive and depression L. Referrals and appointments   In addition, I have reviewed and discussed with patient certain preventive protocols, quality metrics, and best practice recommendations. A written personalized care plan for preventive services as well as general preventive health recommendations were provided to patient.   Signed,  Reather Littler, LPN Nurse Health Advisor   Nurse Notes: pt states she would like a referral to new therapist. She was unaware that psychiatrist only manage meds but she also needs the therapy aspect to manage depression. She does report feeling better and losing a few pounds with diet and exercise.

## 2019-01-16 ENCOUNTER — Other Ambulatory Visit: Payer: Self-pay

## 2019-01-16 ENCOUNTER — Ambulatory Visit (INDEPENDENT_AMBULATORY_CARE_PROVIDER_SITE_OTHER): Payer: Medicare Other | Admitting: Family Medicine

## 2019-01-16 ENCOUNTER — Encounter: Payer: Self-pay | Admitting: Family Medicine

## 2019-01-16 DIAGNOSIS — M79672 Pain in left foot: Secondary | ICD-10-CM | POA: Diagnosis not present

## 2019-01-16 MED ORDER — MELOXICAM 7.5 MG PO TABS: ORAL_TABLET | ORAL | 0 refills | Status: DC

## 2019-01-16 NOTE — Progress Notes (Signed)
Name: Emily Butler   MRN: [id number]    DOB: Mar 25, 1955   Date:01/16/2019       Progress Note  Subjective  Chief Complaint  Chief Complaint  Patient presents with  . Foot Pain    left foot pain, burning, painful to walk    I connected with  [name] on 01/16/19 at  9:20 AM EST by telephone and verified that I am speaking with the correct person using two identifiers.   I discussed the limitations, risks, security and privacy concerns of performing an evaluation and management service by telephone and the availability of in person appointments. Staff also discussed with the patient that there may be a patient responsible charge related to this service. Patient Location: Home Provider Location: Office Additional Individuals present: None  HPI  Pt presents with concern for burning pain on the top of her LEFT foot just below the start of her 2nd-4th toes - ongoing for about 7 days.  She notes history of similar issue in the past and reports that course of prednisone helped, though she denies history of diagnosis of gout.  Pain is exacerbated by walking, rest does eventually relieve the pain. She denies any pain on the bottom of the foot; there is no swelling, no erythema, no weakness of the left leg or foot.  Patient Active Problem List   Diagnosis Date Noted  . MDD (major depressive disorder), recurrent episode, mild ([place]) 12/11/2018  . Alcohol use disorder, moderate, in sustained remission ([place]) 12/11/2018  . Diarrhea 12/07/2018  . IFG (impaired fasting glucose) 12/07/2018  . Current severe episode of major depressive disorder without psychotic features ([place]) 12/07/2018  . Insomnia 12/07/2018  . Chronic kidney disease, unspecified 08/13/2013  . Gastroesophageal reflux disease without esophagitis 08/13/2013  . Restless legs syndrome (RLS) 05/23/2013  . Umbilical hernia 02/54/2706  . Atrial fibrillation ([place]) 07/07/2012  . Hepatic cirrhosis ([place]) 07/07/2012  . Former  smoker 07/07/2012  . Hypertension 07/07/2012  . Chronic hepatitis C ([place]) 07/07/2012  . THROMBOCYTOPENIA 12/20/2006    Social History   Tobacco Use  . Smoking status: Former Research scientist (life sciences)  . Smokeless tobacco: Never Used  Substance Use Topics  . 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  aspirin EC 81 MG tablet, Take 81 mg by mouth daily., Disp: , Rfl:  .  ferrous sulfate 325 (65 FE) MG tablet, Take by mouth., Disp: , Rfl:  .  loperamide (IMODIUM A-D) 2 MG tablet, Take 1 tablet (2 mg total) by mouth 4 (four) times daily as needed for diarrhea or loose stools., Disp: 30 tablet, Rfl: 0 .  losartan (COZAAR) 25 MG tablet, Take 1 tablet (25 mg total) by mouth daily., Disp: 90 tablet, Rfl: 1 .  Multiple Vitamin (MULTI-VITAMIN) tablet, Take by mouth., Disp: , Rfl:  .  omeprazole (PRILOSEC) 20 MG capsule, Take 1 capsule (20 mg total) by mouth daily., Disp: 90 capsule, Rfl: 1 .  propranolol (INDERAL) 40 MG tablet, Take 1 tablet (40 mg total) by mouth 2 (two) times daily., Disp: 180 tablet, Rfl: 1 .  sertraline (ZOLOFT) 25 MG tablet, Take 1 tablet (25 mg total) by mouth daily., Disp: 90 tablet, Rfl: 1 .  simethicone (GAS-X) 80 MG chewable tablet, Chew 1 tablet (80 mg total) by mouth every 6 (six) hours as needed for flatulence., Disp: 30 tablet, Rfl: 0 .  furosemide (LASIX) 40 MG tablet,  Take 1 tablet (40 mg total) by mouth daily., Disp: 90 tablet, Rfl: 1  Allergies  Allergen Reactions  . Duloxetine Other (See Comments) and Nausea Only  . Codeine     REACTION: GI upset    I personally reviewed active problem list, medication list, allergies, notes from last encounter, lab results with the patient/caregiver today.  ROS  Ten systems reviewed and is negative except as mentioned in HPI  Objective  Virtual encounter, vitals not obtained.  There is no height or weight on file to calculate BMI.  Nursing Note and Vital Signs  reviewed.  Physical Exam  Pulmonary/Chest: Effort normal. No respiratory distress. Speaking in complete sentences Neurological: Pt is alert and oriented to person, place, and time. Speech is normal. Psychiatric: Patient has a normal mood and affect. behavior is normal. Judgment and thought content normal.  No results found for this or any previous visit (from the past 72 hour(s)).  Assessment & Plan  1. Left foot pain - DG Foot Complete Left; Future - meloxicam (MOBIC) 7.5 MG tablet; Take once daily for 7 days, then once daily as needed for foot pain.  Dispense: 15 tablet; Refill: 0  -Red flags and when to present for emergency care or RTC including fever >101.7F, chest pain, shortness of breath, new/worsening/un-resolving symptoms, reviewed with patient at time of visit. Follow up and care instructions discussed and provided in AVS. - I discussed the assessment and treatment plan with the patient. The patient was provided an opportunity to ask questions and all were answered. The patient agreed with the plan and demonstrated an understanding of the instructions.  - The patient was advised to call back or seek an in-person evaluation if the symptoms worsen or if the condition fails to improve as anticipated.  I provided 11 minutes of non-face-to-face time during this encounter.  Doren Custard, FNP

## 2019-01-17 ENCOUNTER — Telehealth: Payer: Medicare Other | Admitting: Family Medicine

## 2019-02-01 ENCOUNTER — Other Ambulatory Visit: Payer: Self-pay | Admitting: Family Medicine

## 2019-02-01 DIAGNOSIS — I10 Essential (primary) hypertension: Secondary | ICD-10-CM

## 2019-02-02 ENCOUNTER — Other Ambulatory Visit: Payer: Self-pay | Admitting: Family Medicine

## 2019-02-02 DIAGNOSIS — M79672 Pain in left foot: Secondary | ICD-10-CM

## 2019-02-02 NOTE — Telephone Encounter (Signed)
Requested medication (s) are due for refill today: yes  Requested medication (s) are on the active medication list: yes  Last refill:  01/16/2019  Future visit scheduled: yes  Notes to clinic:  Looks like this was suppose to be filled for a short period of time Review for refill   Requested Prescriptions  Pending Prescriptions Disp Refills   meloxicam (MOBIC) 7.5 MG tablet [Pharmacy Med Name: MELOXICAM 7.5 MG TABLET] 15 tablet 0    Sig: Take once daily for 7 days, then once daily as needed for foot pain.     Analgesics:  COX2 Inhibitors Passed - 02/02/2019  1:07 PM      Passed - HGB in normal range and within 360 days    Hemoglobin  Date Value Ref Range Status  08/17/2018 12.0 11.7 - 15.5 g/dL Final   HGB  Date Value Ref Range Status  02/04/2012 8.5 (L) 12.0 - 16.0 g/dL Final         Passed - Cr in normal range and within 360 days    Creat  Date Value Ref Range Status  08/17/2018 0.99 0.50 - 0.99 mg/dL Final    Comment:    For patients >2 years of age, the reference limit for Creatinine is approximately 13% higher for people identified as African-American. Renella Cunas - Patient is not pregnant      Passed - Valid encounter within last 12 months    Recent Outpatient Visits          2 weeks ago Left foot pain   [place] [place], FNP   1 month ago Gastroesophageal reflux disease without esophagitis   [place], FNP   3 months ago Bilateral calf pain   [place], FNP   5 months ago Hepatic cirrhosis, unspecified hepatic cirrhosis type, unspecified whether ascites present [facility])   [place], FNP   5 months ago Gastroesophageal reflux disease without esophagitis   [facility] [place], FNP      Future Appointments            In 10 months [facility], [facility]

## 2019-02-06 ENCOUNTER — Other Ambulatory Visit: Payer: Self-pay | Admitting: Family Medicine

## 2019-02-06 DIAGNOSIS — I10 Essential (primary) hypertension: Secondary | ICD-10-CM

## 2019-02-06 DIAGNOSIS — I4891 Unspecified atrial fibrillation: Secondary | ICD-10-CM

## 2019-02-07 ENCOUNTER — Other Ambulatory Visit: Payer: Self-pay | Admitting: Family Medicine

## 2019-02-07 DIAGNOSIS — F322 Major depressive disorder, single episode, severe without psychotic features: Secondary | ICD-10-CM

## 2019-02-07 DIAGNOSIS — G2581 Restless legs syndrome: Secondary | ICD-10-CM

## 2019-02-07 DIAGNOSIS — G4700 Insomnia, unspecified: Secondary | ICD-10-CM

## 2019-02-07 NOTE — Telephone Encounter (Signed)
Requested medication (s) are due for refill today: yes  Requested medication (s) are on the active medication list: yes  Last refill:  11/13/2018  Future visit scheduled: yes  Notes to clinic:  Medication was discontinued    Requested Prescriptions  Pending Prescriptions Disp Refills   traZODone (DESYREL) 50 MG tablet [Pharmacy Med Name: TRAZODONE 50 MG TABLET] 90 tablet 0    Sig: TAKE 0.5-1 TABLETS (25-50 MG TOTAL) BY MOUTH AT BEDTIME AS NEEDED FOR SLEEP.     Psychiatry: Antidepressants - Serotonin Modulator Passed - 02/07/2019 11:30 AM      Passed - Valid encounter within last 6 months    Recent Outpatient Visits          3 weeks ago Left foot pain   [place], FNP   2 months ago Gastroesophageal reflux disease without esophagitis   [place], [place], FNP   3 months ago Bilateral calf pain   [place], [place], FNP   5 months ago Hepatic cirrhosis, unspecified hepatic cirrhosis type, unspecified whether ascites present [facility])   [place], FNP   6 months ago Gastroesophageal reflux disease without esophagitis   [facility] [place], FNP      Future Appointments            In 10 months [place] - Completed PHQ-2 or PHQ-9 in the last 360 days.

## 2019-02-14 ENCOUNTER — Ambulatory Visit (INDEPENDENT_AMBULATORY_CARE_PROVIDER_SITE_OTHER): Payer: Medicare Other | Admitting: Psychiatry

## 2019-02-14 ENCOUNTER — Encounter: Payer: Self-pay | Admitting: Psychiatry

## 2019-02-14 ENCOUNTER — Other Ambulatory Visit: Payer: Self-pay

## 2019-02-14 DIAGNOSIS — F330 Major depressive disorder, recurrent, mild: Secondary | ICD-10-CM | POA: Diagnosis not present

## 2019-02-14 DIAGNOSIS — F1021 Alcohol dependence, in remission: Secondary | ICD-10-CM

## 2019-02-14 MED ORDER — SERTRALINE HCL 50 MG PO TABS: 50.0000 mg | ORAL_TABLET | Freq: Every day | ORAL | 0 refills | Status: DC

## 2019-02-14 NOTE — Progress Notes (Addendum)
Virtual Visit via Video Note  I connected with Emily Alfonna L Ornstein on 02/14/19 at  1:00 PM EST by a video enabled telemedicine application and verified that I am speaking with the correct person using two identifiers.   I discussed the limitations of evaluation and management by telemedicine and the availability of in person appointments. The patient expressed understanding and agreed to proceed.    I discussed the assessment and treatment plan with the patient. The patient was provided an opportunity to ask questions and all were answered. The patient agreed with the plan and demonstrated an understanding of the instructions.   The patient was advised to call back or seek an in-person evaluation if the symptoms worsen or if the condition fails to improve as anticipated.   BH MD OP Progress Note  02/14/2019 1:36 PM Emily Butler  MRN:  [id number]  Chief Complaint:  Chief Complaint    Follow-up     HPI: Emily Butler is a 63 year old female, retired, disabled, lives in [place], has a history of depression, alcohol use disorder in remission, atrial fibrillation, hepatitis C was evaluated by telemedicine today.  Video call was initiated however due to connection problem it had to be changed to a phone call during the session.  Patient today reports she continues to struggle with sadness, lack of motivation.  She reports she has several psychosocial stressors including relationship struggles.  She is currently working on building a relationship with her children.  She reports the Zoloft does help her however she is interested in a dosage increase.  She is compliant on the medication and denies side effects.  Patient reports she continues to struggle with sleep.  She reports she takes over-the-counter sleep medications like Tylenol PM which helps to some extent.  She tried trazodone previously which made her feel depressed in the morning.  She has quit taking it.  Patient denies any suicidality,  homicidality or perceptual disturbances.  Patient has been unable to find a therapist however reports she is motivated to establish care and start psychotherapy sessions soon.  Patient denies any other concerns today. Visit Diagnosis:    ICD-10-CM   1. MDD (major depressive disorder), recurrent episode, mild (HCC)  F33.0 sertraline (ZOLOFT) 50 MG tablet  2. Alcohol use disorder, moderate, in sustained remission (HCC)  F10.21     Past Psychiatric History: Reviewed past psychiatric history from my progress note on 12/11/2018.  Past trials of Lexapro, Wellbutrin, trazodone, Zoloft.  Past Medical History:  Past Medical History:  Diagnosis Date  . Alcoholic (HCC)   . Cirrhosis of liver (HCC)   . Depression   . EXCESSIVE MENSTRUATION 11/08/2006   Qualifier: Diagnosis of  By: Jillyn HiddenBean FNP, Mcarthur RossettiBillie-Lynn Daniels   . GERD (gastroesophageal reflux disease)   . Hepatitis C 2011  . Hypertension     Past Surgical History:  Procedure Laterality Date  . CESAREAN SECTION      Family Psychiatric History: I have reviewed family psychiatric history from my progress note on 12/11/2018.  Family History:  Family History  Problem Relation Age of Onset  . Alcohol abuse Mother   . Drug abuse Mother   . Heart disease Father     Social History: I have reviewed social history from my progress note on 12/11/2018. Social History   Socioeconomic History  . Marital status: Divorced    Spouse name: Not on file  . Number of children: 4  . Years of education: Not on file  . Highest education level:  Not on file  Occupational History  . Occupation: disability  Tobacco Use  . Smoking status: Former Research scientist (life sciences)  . Smokeless tobacco: Never Used  Substance and Sexual Activity  . Alcohol use: Not Currently    Comment: Alcoholic quit 10 years ago  . Drug use: Never  . Sexual activity: Not Currently  Other Topics Concern  . Not on file  Social History Narrative  . Not on file   Social Determinants of Health    Financial Resource Strain: Low Risk   . Difficulty of Paying Living Expenses: Not hard at all  Food Insecurity: No Food Insecurity  . Worried About Charity fundraiser in the Last Year: Never true  . Ran Out of Food in the Last Year: Never true  Transportation Needs: Unmet Transportation Needs  . Lack of Transportation (Medical): Yes  . Lack of Transportation (Non-Medical): Yes  Physical Activity: Sufficiently Active  . Days of Exercise per Week: 6 days  . Minutes of Exercise per Session: 60 min  Stress: Stress Concern Present  . Feeling of Stress : To some extent  Social Connections: Moderately Isolated  . Frequency of Communication with Friends and Family: More than three times a week  . Frequency of Social Gatherings with Friends and Family: More than three times a week  . Attends Religious Services: Never  . Active Member of Clubs or Organizations: No  . Attends Archivist Meetings: Never  . Marital Status: Divorced    Allergies:  Allergies  Allergen Reactions  . Duloxetine Other (See Comments) and Nausea Only  . Codeine     REACTION: GI upset    Metabolic Disorder Labs: Lab Results  Component Value Date   HGBA1C 5.3 08/17/2018   MPG 105 08/17/2018   No results found for: PROLACTIN Lab Results  Component Value Date   CHOL 178 08/17/2018   TRIG 165 (H) 08/17/2018   HDL 43 (L) 08/17/2018   CHOLHDL 4.1 08/17/2018   VLDL 19 02/04/2012   LDLCALC 107 (H) 08/17/2018   LDLCALC 58 02/04/2012   No results found for: TSH  Therapeutic Level Labs: No results found for: LITHIUM No results found for: VALPROATE No components found for:  CBMZ  Current Medications: Current Outpatient Medications  Medication Sig Dispense Refill  . aspirin EC 81 MG tablet Take 81 mg by mouth daily.    . ferrous sulfate 325 (65 FE) MG tablet Take by mouth.    . furosemide (LASIX) 40 MG tablet TAKE 1 TABLET BY MOUTH EVERY DAY 90 tablet 1  . loperamide (IMODIUM A-D) 2 MG tablet  Take 1 tablet (2 mg total) by mouth 4 (four) times daily as needed for diarrhea or loose stools. 30 tablet 0  . losartan (COZAAR) 25 MG tablet TAKE 1 TABLET BY MOUTH EVERY DAY 90 tablet 1  . meloxicam (MOBIC) 7.5 MG tablet Take once daily for 7 days, then once daily as needed for foot pain. 15 tablet 0  . Multiple Vitamin (MULTI-VITAMIN) tablet Take by mouth.    Marland Kitchen omeprazole (PRILOSEC) 20 MG capsule Take 1 capsule (20 mg total) by mouth daily. 90 capsule 1  . propranolol (INDERAL) 40 MG tablet Take 1 tablet (40 mg total) by mouth 2 (two) times daily. 180 tablet 1  . sertraline (ZOLOFT) 50 MG tablet Take 1 tablet (50 mg total) by mouth daily. 90 tablet 0  . simethicone (GAS-X) 80 MG chewable tablet Chew 1 tablet (80 mg total) by mouth every 6 (six)  hours as needed for flatulence. 30 tablet 0   No current facility-administered medications for this visit.     Musculoskeletal: Strength & Muscle Tone: UTA Gait & Station: Reports as WNL Patient leans: N/A  Psychiatric Specialty Exam: Review of Systems  Psychiatric/Behavioral: Positive for dysphoric mood and sleep disturbance. The patient is nervous/anxious.   All other systems reviewed and are negative.   There were no vitals taken for this visit.There is no height or weight on file to calculate BMI.  General Appearance: UTA  Eye Contact:  UTA  Speech:  Clear and Coherent  Volume:  Normal  Mood:  Anxious and Depressed  Affect:  UTA  Thought Process:  Goal Directed and Descriptions of Associations: Intact  Orientation:  Full (Time, Place, and Person)  Thought Content: Logical   Suicidal Thoughts:  No  Homicidal Thoughts:  No  Memory:  Immediate;   Fair Recent;   Fair Remote;   Fair  Judgement:  Fair  Insight:  Fair  Psychomotor Activity:  UTA  Concentration:  Concentration: Fair and Attention Span: Fair  Recall:  [employer] of Knowledge: Fair  Language: Fair  Akathisia:  No  Handed:  Right  AIMS (if indicated): Denies tremors,  rigidity  Assets:  Communication Skills Desire for Improvement Social Support  ADL's:  Intact  Cognition: WNL  Sleep:  Restless   Screenings: GAD-7     [facility]11/2018 in [facility]  Total GAD-7 Score  4    PHQ2-9     Office Visit from 01/16/2019 in [facility] Clinical Support from 12/21/2018 in [facility] Office Visit from 12/07/2018 in [facility] Office Visit from 10/18/2018 in [facility] Office Visit from 08/17/2018 in [facility]  PHQ-2 Total Score  0  0  0  4  0  PHQ-9 Total Score  0  --  0  11  2       Assessment and Plan: Milagros is a 63 year old female, disabled, lives in [place], has a history of depression, alcohol use disorder in remission, atrial fibrillation, hepatitis C was evaluated by telemedicine today.  She is biologically predisposed given her family history.  She also has history of substance abuse problems although currently in remission.  She currently has psychosocial stressors of her own health issues, the pandemic, history of relationship struggles with her ex-husband.  Patient will benefit from medication readjustment since she continues to struggle with depressive symptoms.  Patient will also need psychotherapy sessions.  Plan as noted below.  Plan MDD-improving Increase Zoloft to 50 mg p.o. daily Discussed sleep hygiene techniques. Patient advised to start melatonin over-the-counter.  Alcohol use disorder in remission We will monitor closely  Patient provided more information for therapist in the community.  She will establish care for CBT.  History of polysubstance abuse in remission-we will monitor closely.  Follow-up in clinic in 4 weeks or sooner if needed.  January 14 at 1 PM  I have spent atleast 15 minutes non face to face with patient today. More than 50 % of the time was spent for psychoeducation and supportive  psychotherapy and care coordination. This note was generated in part or whole with voice recognition software. Voice recognition is usually quite accurate but there are transcription errors that can and very often do occur. I apologize for any typographical errors that were not detected and corrected.       Namiah Dunnavant,  MD 02/14/2019, 1:36 PM

## 2019-02-15 ENCOUNTER — Other Ambulatory Visit: Payer: Self-pay | Admitting: Family Medicine

## 2019-02-15 DIAGNOSIS — I10 Essential (primary) hypertension: Secondary | ICD-10-CM

## 2019-02-15 DIAGNOSIS — I4891 Unspecified atrial fibrillation: Secondary | ICD-10-CM

## 2019-03-15 ENCOUNTER — Encounter: Payer: Self-pay | Admitting: Psychiatry

## 2019-03-15 ENCOUNTER — Ambulatory Visit (INDEPENDENT_AMBULATORY_CARE_PROVIDER_SITE_OTHER): Payer: Medicare Other | Admitting: Psychiatry

## 2019-03-15 ENCOUNTER — Other Ambulatory Visit: Payer: Self-pay

## 2019-03-15 DIAGNOSIS — F1021 Alcohol dependence, in remission: Secondary | ICD-10-CM

## 2019-03-15 DIAGNOSIS — F330 Major depressive disorder, recurrent, mild: Secondary | ICD-10-CM

## 2019-03-15 MED ORDER — SERTRALINE HCL 50 MG PO TABS: 75.0000 mg | ORAL_TABLET | Freq: Every day | ORAL | 0 refills | Status: DC

## 2019-03-15 NOTE — Progress Notes (Signed)
Provider Location : [facility] Patient Location : Home  Virtual Visit via Telephone Note  I connected with Gearlean Alf on 03/15/19 at  1:00 PM EST by telephone and verified that I am speaking with the correct person using two identifiers.   I discussed the limitations, risks, security and privacy concerns of performing an evaluation and management service by telephone and the availability of in person appointments. I also discussed with the patient that there may be a patient responsible charge related to this service. The patient expressed understanding and agreed to proceed.      I discussed the assessment and treatment plan with the patient. The patient was provided an opportunity to ask questions and all were answered. The patient agreed with the plan and demonstrated an understanding of the instructions.   The patient was advised to call back or seek an in-person evaluation if the symptoms worsen or if the condition fails to improve as anticipated.  [facility] MD OP Progress Note  03/15/2019 1:08 PM NIDHI JACOME  MRN:  [id number]  Chief Complaint:  Chief Complaint    Follow-up     HPI: Makiya is a 64 year old female, retired, disabled, lives in [place], has a history of depression, alcohol use disorder in remission, atrial fibrillation, hepatitis C was evaluated by phone today.  Patient preferred to do a phone call.  Patient today reports she continues to struggle with sadness, anhedonia and lack of motivation.  She reports there are times when she feels like she cannot feel her emotions.  There are times when she cannot get out of bed in the morning and it is a struggle for her.  She reports she tries to get on YouTube often and that helps her to laugh and distract herself.   She reports sleep is good.  Patient denies any suicidality, homicidality or perceptual disturbances.  She reports she was able to find a therapist-therapist Ms. Evalina Field and has upcoming appointment  scheduled.  She is motivated to keep it.  Patient reports she had a good holiday season when she was able to visit her children.  Patient denies any other concerns today.  Visit Diagnosis:    ICD-10-CM   1. MDD (major depressive disorder), recurrent episode, mild (HCC)  F33.0 sertraline (ZOLOFT) 50 MG tablet  2. Alcohol use disorder, moderate, in sustained remission (HCC)  F10.21     Past Psychiatric History: I have reviewed past psychiatric history from my progress note on 12/11/2018.  Past Medical History:  Past Medical History:  Diagnosis Date  . Alcoholic (HCC)   . Cirrhosis of liver (HCC)   . Depression   . EXCESSIVE MENSTRUATION 11/08/2006   Qualifier: Diagnosis of  By: Jillyn Hidden FNP, Mcarthur Rossetti   . GERD (gastroesophageal reflux disease)   . Hepatitis C 2011  . Hypertension     Past Surgical History:  Procedure Laterality Date  . CESAREAN SECTION      Family Psychiatric History: Reviewed family psychiatric history from my progress note on 12/11/2018.  Family History:  Family History  Problem Relation Age of Onset  . Alcohol abuse Mother   . Drug abuse Mother   . Heart disease Father     Social History: Reviewed social history from my progress note on 12/11/2018. Social History   Socioeconomic History  . Marital status: Divorced    Spouse name: Not on file  . Number of children: 4  . Years of education: Not on file  . Highest education level: Not  on file  Occupational History  . Occupation: disability  Tobacco Use  . Smoking status: Former Games developer  . Smokeless tobacco: Never Used  Substance and Sexual Activity  . Alcohol use: Not Currently    Comment: Alcoholic quit 10 years ago  . Drug use: Never  . Sexual activity: Not Currently  Other Topics Concern  . Not on file  Social History Narrative  . Not on file   Social Determinants of Health   Financial Resource Strain: Low Risk   . Difficulty of Paying Living Expenses: Not hard at all  Food  Insecurity: No Food Insecurity  . Worried About Programme researcher, broadcasting/film/video in the Last Year: Never true  . Ran Out of Food in the Last Year: Never true  Transportation Needs: Unmet Transportation Needs  . Lack of Transportation (Medical): Yes  . Lack of Transportation (Non-Medical): Yes  Physical Activity: Sufficiently Active  . Days of Exercise per Week: 6 days  . Minutes of Exercise per Session: 60 min  Stress: Stress Concern Present  . Feeling of Stress : To some extent  Social Connections: Moderately Isolated  . Frequency of Communication with Friends and Family: More than three times a week  . Frequency of Social Gatherings with Friends and Family: More than three times a week  . Attends Religious Services: Never  . Active Member of Clubs or Organizations: No  . Attends Banker Meetings: Never  . Marital Status: Divorced    Allergies:  Allergies  Allergen Reactions  . Duloxetine Other (See Comments) and Nausea Only  . Codeine     REACTION: GI upset    Metabolic Disorder Labs: Lab Results  Component Value Date   HGBA1C 5.3 08/17/2018   MPG 105 08/17/2018   No results found for: PROLACTIN Lab Results  Component Value Date   CHOL 178 08/17/2018   TRIG 165 (H) 08/17/2018   HDL 43 (L) 08/17/2018   CHOLHDL 4.1 08/17/2018   VLDL 19 02/04/2012   LDLCALC 107 (H) 08/17/2018   LDLCALC 58 02/04/2012   No results found for: TSH  Therapeutic Level Labs: No results found for: LITHIUM No results found for: VALPROATE No components found for:  CBMZ  Current Medications: Current Outpatient Medications  Medication Sig Dispense Refill  . aspirin EC 81 MG tablet Take 81 mg by mouth daily.    . ferrous sulfate 325 (65 FE) MG tablet Take by mouth.    . furosemide (LASIX) 40 MG tablet TAKE 1 TABLET BY MOUTH EVERY DAY 90 tablet 1  . loperamide (IMODIUM A-D) 2 MG tablet Take 1 tablet (2 mg total) by mouth 4 (four) times daily as needed for diarrhea or loose stools. 30 tablet  0  . losartan (COZAAR) 25 MG tablet TAKE 1 TABLET BY MOUTH EVERY DAY 90 tablet 1  . meloxicam (MOBIC) 7.5 MG tablet Take once daily for 7 days, then once daily as needed for foot pain. 15 tablet 0  . Multiple Vitamin (MULTI-VITAMIN) tablet Take by mouth.    Marland Kitchen omeprazole (PRILOSEC) 20 MG capsule Take 1 capsule (20 mg total) by mouth daily. 90 capsule 1  . propranolol (INDERAL) 40 MG tablet TAKE 1 TABLET BY MOUTH TWICE A DAY 180 tablet 1  . sertraline (ZOLOFT) 50 MG tablet Take 1.5 tablets (75 mg total) by mouth daily. 135 tablet 0  . simethicone (GAS-X) 80 MG chewable tablet Chew 1 tablet (80 mg total) by mouth every 6 (six) hours as needed for flatulence.  30 tablet 0   No current facility-administered medications for this visit.     Musculoskeletal: Strength & Muscle Tone: UTA Gait & Station: Reports as WNL Patient leans: N/A  Psychiatric Specialty Exam: Review of Systems  Psychiatric/Behavioral: Positive for dysphoric mood.  All other systems reviewed and are negative.   There were no vitals taken for this visit.There is no height or weight on file to calculate BMI.  General Appearance: UTA  Eye Contact:  UTA  Speech:  Clear and Coherent  Volume:  Normal  Mood:  Depressed  Affect:  UTA  Thought Process:  Goal Directed and Descriptions of Associations: Intact  Orientation:  Full (Time, Place, and Person)  Thought Content: Logical   Suicidal Thoughts:  No  Homicidal Thoughts:  No  Memory:  Immediate;   Fair Recent;   Fair Remote;   Fair  Judgement:  Fair  Insight:  Fair  Psychomotor Activity:  UTA  Concentration:  Concentration: Fair and Attention Span: Fair  Recall:  [employer] of Knowledge: Fair  Language: Fair  Akathisia:  No  Handed:  Right  AIMS (if indicated): Denies tremors, rigidity  Assets:  Communication Skills Desire for Improvement Housing Social Support  ADL's:  Intact  Cognition: WNL  Sleep:  Fair   Screenings: GAD-7     [facility] Visit from  [facility]/11/2018 in [facility]  Total GAD-7 Score  4    PHQ2-9     Office Visit from 01/16/2019 in [place] from 12/21/2018 [facility] Office Visit from 12/07/2018 in [facility] Office Visit from 10/18/2018 in [facility] Office Visit from 08/17/2018 in [facility]  PHQ-2 Total Score  0  0  0  4  0  PHQ-9 Total Score  0  -  0  11  2       Assessment and Plan: Nekisha is a 64 year old female, disabled, lives in [place], has a history of depression, alcohol use disorder in remission, atrial fibrillation, hepatitis C was evaluated by telemedicine today.  She is biologically predisposed given her family history.  She also has history of substance abuse problems although currently in remission.  Patient with psychosocial stressors of her own health issues, the current pandemic, relationship struggles with her ex-husband continues to struggle with depressive symptoms.  She will benefit from medication readjustment and psychotherapy sessions.  Plan MDD-some progress Increase Zoloft to 75 mg p.o. daily Discussed sleep hygiene techniques.  Patient advised to start melatonin over-the-counter.  Alcohol use disorder in remission We will continue to monitor closely  Patient is about to start psychotherapy sessions with therapist Ms. Pervis Hocking.  Follow-up in clinic in 6 weeks or sooner if needed.  February 25 at 11 AM  I have spent atleast 20 minutes non face to face with patient today. More than 50 % of the time was spent for ordering medications and test ,psychoeducation and supportive psychotherapy and care coordination,as well as documenting clinical information in electronic health record. This note was generated in part or whole with voice recognition software. Voice recognition is usually quite accurate but there are transcription errors that can and very often  do occur. I apologize for any typographical errors that were not detected and corrected.       Ursula Alert, MD 03/15/2019, 1:08 PM

## 2019-04-02 ENCOUNTER — Ambulatory Visit (INDEPENDENT_AMBULATORY_CARE_PROVIDER_SITE_OTHER): Payer: Medicare Other | Admitting: Psychology

## 2019-04-02 DIAGNOSIS — F4323 Adjustment disorder with mixed anxiety and depressed mood: Secondary | ICD-10-CM

## 2019-04-03 ENCOUNTER — Other Ambulatory Visit: Payer: Self-pay | Admitting: Family Medicine

## 2019-04-03 DIAGNOSIS — K219 Gastro-esophageal reflux disease without esophagitis: Secondary | ICD-10-CM

## 2019-04-10 ENCOUNTER — Other Ambulatory Visit: Payer: Self-pay | Admitting: Family Medicine

## 2019-04-17 ENCOUNTER — Ambulatory Visit: Payer: Medicare Other | Admitting: Psychiatry

## 2019-04-18 ENCOUNTER — Ambulatory Visit (INDEPENDENT_AMBULATORY_CARE_PROVIDER_SITE_OTHER): Payer: Medicare Other | Admitting: Psychology

## 2019-04-18 DIAGNOSIS — F331 Major depressive disorder, recurrent, moderate: Secondary | ICD-10-CM

## 2019-04-26 ENCOUNTER — Other Ambulatory Visit: Payer: Self-pay

## 2019-04-26 ENCOUNTER — Ambulatory Visit (INDEPENDENT_AMBULATORY_CARE_PROVIDER_SITE_OTHER): Payer: Medicare Other | Admitting: Psychiatry

## 2019-04-26 ENCOUNTER — Encounter: Payer: Self-pay | Admitting: Psychiatry

## 2019-04-26 DIAGNOSIS — F1021 Alcohol dependence, in remission: Secondary | ICD-10-CM

## 2019-04-26 DIAGNOSIS — F330 Major depressive disorder, recurrent, mild: Secondary | ICD-10-CM

## 2019-04-26 MED ORDER — MIRTAZAPINE 15 MG PO TABS: 7.5000 mg | ORAL_TABLET | Freq: Every day | ORAL | 1 refills | Status: DC

## 2019-04-26 NOTE — Progress Notes (Signed)
Provider Location : [facility] Patient Location : Home  Virtual Visit via Video Note  I connected with [name] on 04/26/19 at 11:00 AM EST by a video enabled telemedicine application and verified that I am speaking with the correct person using two identifiers.   I discussed the limitations of evaluation and management by telemedicine and the availability of in person appointments. The patient expressed understanding and agreed to proceed.    I discussed the assessment and treatment plan with the patient. The patient was provided an opportunity to ask questions and all were answered. The patient agreed with the plan and demonstrated an understanding of the instructions.   The patient was advised to call back or seek an in-person evaluation if the symptoms worsen or if the condition fails to improve as anticipated.   [facility] MD OP Progress Note  04/26/2019 12:08 PM Emily Butler  MRN:  [id number]  Chief Complaint:  Chief Complaint    Follow-up     HPI: Emily Butler is a 64 year old female, retired, disabled, lives in [place], has a history of depression, alcohol use disorder in remission, atrial fibrillation, hepatitis C was evaluated by telemedicine today.  Patient today reports she went through a few days of sadness recently.  She however reports the past couple of days has been better.  She has started painting again.  That has been keeping her occupied.  She however continues to struggle with sleep.  She reports she has not been able to sleep since the past 2 days since she has been having a lot of racing thoughts.  She reports she has been thinking a lot about her past and her ex-husband, relationship struggles.  She is currently following up with her therapist and reports therapy sessions as beneficial.  Patient denies any suicidality, homicidality or perceptual disturbances.  Patient denies any other concerns today. Visit Diagnosis:    ICD-10-CM   1. MDD (major depressive disorder),  recurrent episode, mild (HCC)  F33.0 mirtazapine (REMERON) 15 MG tablet  2. Alcohol use disorder, moderate, in sustained remission ([place])  F10.21     Past Psychiatric History: I have reviewed past psychiatric history from my progress note on 12/11/2018.  Past Medical History:  Past Medical History:  Diagnosis Date  . Alcoholic ([place])   . Cirrhosis of liver ([place])   . Depression   . EXCESSIVE MENSTRUATION 11/08/2006   Qualifier: Diagnosis of  By: Maxie Better FNP, Rosalita Levan   . GERD (gastroesophageal reflux disease)   . Hepatitis C 2011  . Hypertension     Past Surgical History:  Procedure Laterality Date  . CESAREAN SECTION      Family Psychiatric History: I have reviewed family psychiatric history from my progress note on 12/11/2018.  Family History:  Family History  Problem Relation Age of Onset  . Alcohol abuse Mother   . Drug abuse Mother   . Heart disease Father     Social History: I have reviewed social history from my progress note on 12/11/2018. Social History   Socioeconomic History  . Marital status: Divorced    Spouse name: Not on file  . Number of children: 4  . Years of education: Not on file  . Highest education level: Not on file  Occupational History  . Occupation: disability  Tobacco Use  . Smoking status: Former Research scientist (life sciences)  . Smokeless tobacco: Never Used  Substance and Sexual Activity  . Alcohol use: Not Currently    Comment: Alcoholic quit 10 years ago  .  Drug use: Never  . Sexual activity: Not Currently  Other Topics Concern  . Not on file  Social History Narrative  . Not on file   Social Determinants of Health   Financial Resource Strain: Low Risk   . Difficulty of Paying Living Expenses: Not hard at all  Food Insecurity: No Food Insecurity  . Worried About Programme researcher, broadcasting/film/video in the Last Year: Never true  . Ran Out of Food in the Last Year: Never true  Transportation Needs: Unmet Transportation Needs  . Lack of Transportation (Medical):  Yes  . Lack of Transportation (Non-Medical): Yes  Physical Activity: Sufficiently Active  . Days of Exercise per Week: 6 days  . Minutes of Exercise per Session: 60 min  Stress: Stress Concern Present  . Feeling of Stress : To some extent  Social Connections: Moderately Isolated  . Frequency of Communication with Friends and Family: More than three times a week  . Frequency of Social Gatherings with Friends and Family: More than three times a week  . Attends Religious Services: Never  . Active Member of Clubs or Organizations: No  . Attends Banker Meetings: Never  . Marital Status: Divorced    Allergies:  Allergies  Allergen Reactions  . Duloxetine Other (See Comments) and Nausea Only  . Codeine     REACTION: GI upset    Metabolic Disorder Labs: Lab Results  Component Value Date   HGBA1C 5.3 08/17/2018   MPG 105 08/17/2018   No results found for: PROLACTIN Lab Results  Component Value Date   CHOL 178 08/17/2018   TRIG 165 (H) 08/17/2018   HDL 43 (L) 08/17/2018   CHOLHDL 4.1 08/17/2018   VLDL 19 02/04/2012   LDLCALC 107 (H) 08/17/2018   LDLCALC 58 02/04/2012   No results found for: TSH  Therapeutic Level Labs: No results found for: LITHIUM No results found for: VALPROATE No components found for:  CBMZ  Current Medications: Current Outpatient Medications  Medication Sig Dispense Refill  . aspirin EC 81 MG tablet Take 81 mg by mouth daily.    . ferrous sulfate 325 (65 FE) MG tablet Take by mouth.    . furosemide (LASIX) 40 MG tablet TAKE 1 TABLET BY MOUTH EVERY DAY 90 tablet 1  . loperamide (IMODIUM A-D) 2 MG tablet Take 1 tablet (2 mg total) by mouth 4 (four) times daily as needed for diarrhea or loose stools. 30 tablet 0  . losartan (COZAAR) 25 MG tablet TAKE 1 TABLET BY MOUTH EVERY DAY 90 tablet 1  . meloxicam (MOBIC) 7.5 MG tablet Take once daily for 7 days, then once daily as needed for foot pain. 15 tablet 0  . mirtazapine (REMERON) 15 MG  tablet Take 0.5-1 tablets (7.5-15 mg total) by mouth at bedtime. sleep 30 tablet 1  . Multiple Vitamin (MULTI-VITAMIN) tablet Take by mouth.    Marland Kitchen omeprazole (PRILOSEC) 20 MG capsule TAKE 1 CAPSULE BY MOUTH EVERY DAY 90 capsule 1  . propranolol (INDERAL) 40 MG tablet TAKE 1 TABLET BY MOUTH TWICE A DAY 180 tablet 1  . sertraline (ZOLOFT) 25 MG tablet TAKE 1 TABLET BY MOUTH DAILY 90 tablet 1  . sertraline (ZOLOFT) 50 MG tablet Take 1.5 tablets (75 mg total) by mouth daily. 135 tablet 0  . simethicone (GAS-X) 80 MG chewable tablet Chew 1 tablet (80 mg total) by mouth every 6 (six) hours as needed for flatulence. 30 tablet 0   No current facility-administered medications for this  visit.     Musculoskeletal: Strength & Muscle Tone: UTA Gait & Station: normal Patient leans: N/A  Psychiatric Specialty Exam: Review of Systems  Psychiatric/Behavioral: Positive for dysphoric mood and sleep disturbance. Negative for agitation, behavioral problems, confusion, decreased concentration, hallucinations, self-injury and suicidal ideas. The patient is not nervous/anxious and is not hyperactive.   All other systems reviewed and are negative.   There were no vitals taken for this visit.There is no height or weight on file to calculate BMI.  General Appearance: Casual  Eye Contact:  Fair  Speech:  Clear and Coherent  Volume:  Normal  Mood:  Dysphoric  Affect:  Congruent  Thought Process:  Goal Directed and Descriptions of Associations: Intact  Orientation:  Full (Time, Place, and Person)  Thought Content: Rumination   Suicidal Thoughts:  No  Homicidal Thoughts:  No  Memory:  Immediate;   Fair Recent;   Fair Remote;   Fair  Judgement:  Fair  Insight:  Fair  Psychomotor Activity:  Normal  Concentration:  Concentration: Fair and Attention Span: Fair  Recall:  [employer] of Knowledge: Fair  Language: Fair  Akathisia:  No  Handed:  Right  AIMS (if indicated): denies tremors, rigidity  Assets:   Communication Skills Desire for Improvement Intimacy Social Support  ADL's:  Intact  Cognition: WNL  Sleep:  Poor   Screenings: GAD-7     [facility] Visit from [facility]/11/2018 in [facility]  Total GAD-7 Score  4    PHQ2-9     Office Visit from 01/16/2019 in [facility] Clinical Support from 12/21/2018 in [facility] Office Visit from 12/07/2018 in [facility] Office Visit from 10/18/2018 in [facility] Office Visit from 08/17/2018 in [facility]  PHQ-2 Total Score  0  0  0  4  0  PHQ-9 Total Score  0  --  0  11  2       Assessment and Plan: Michon is a 64 year old female, disabled, lives in [place], has a history of depression, alcohol use disorder in remission, atrial fibrillation, hepatitis C was evaluated by telemedicine today.  She is biologically predisposed given her family history.  Patient with history of substance abuse problem although currently in remission, has psychosocial stressors of the pandemic, relationship struggles.  Patient continues to struggle with sleep and will benefit from medication readjustment and continued psychotherapy sessions.  Plan MDD-some progress Zoloft 75 mg p.o. daily Add Remeron 7.5 to 50 mg p.o. nightly for mood and sleep. Continue psychotherapy sessions with Ms.Jane Laymond Purser.  Alcohol use disorder in remission Continue to monitor closely.  Follow-up in clinic in 3 weeks or sooner if needed.  I have spent atleast 20 minutes non face to face with patient today. More than 50 % of the time was spent for ordering medications and test ,psychoeducation and supportive psychotherapy and care coordination,as well as documenting clinical information in electronic health record. This note was generated in part or whole with voice recognition software. Voice recognition is usually quite accurate but there are transcription errors that can and very  often do occur. I apologize for any typographical errors that were not detected and corrected.       Jomarie Longs, MD 04/26/2019, 12:08 PM

## 2019-05-07 ENCOUNTER — Ambulatory Visit (INDEPENDENT_AMBULATORY_CARE_PROVIDER_SITE_OTHER): Payer: Medicare Other | Admitting: Psychology

## 2019-05-07 DIAGNOSIS — F331 Major depressive disorder, recurrent, moderate: Secondary | ICD-10-CM

## 2019-05-14 ENCOUNTER — Other Ambulatory Visit: Payer: Self-pay

## 2019-05-14 ENCOUNTER — Ambulatory Visit (INDEPENDENT_AMBULATORY_CARE_PROVIDER_SITE_OTHER): Payer: Medicare Other | Admitting: Psychiatry

## 2019-05-14 ENCOUNTER — Encounter: Payer: Self-pay | Admitting: Psychiatry

## 2019-05-14 DIAGNOSIS — F3341 Major depressive disorder, recurrent, in partial remission: Secondary | ICD-10-CM | POA: Diagnosis not present

## 2019-05-14 DIAGNOSIS — F1021 Alcohol dependence, in remission: Secondary | ICD-10-CM

## 2019-05-14 MED ORDER — MIRTAZAPINE 15 MG PO TABS: 15.0000 mg | ORAL_TABLET | Freq: Every evening | ORAL | 1 refills | Status: DC | PRN

## 2019-05-14 NOTE — Progress Notes (Signed)
Provider Location : [facility] Patient Location : Home  Virtual Visit via Video Note  I connected with Emily Butler on 05/14/19 at 10:40 AM EDT by a video enabled telemedicine application and verified that I am speaking with the correct person using two identifiers.   I discussed the limitations of evaluation and management by telemedicine and the availability of in person appointments. The patient expressed understanding and agreed to proceed.     I discussed the assessment and treatment plan with the patient. The patient was provided an opportunity to ask questions and all were answered. The patient agreed with the plan and demonstrated an understanding of the instructions.   The patient was advised to call back or seek an in-person evaluation if the symptoms worsen or if the condition fails to improve as anticipated.  BH MD OP Progress Note  05/14/2019 12:15 PM Emily Butler  MRN:  [id number]  Chief Complaint:  Chief Complaint    Follow-up     HPI: Emily Butler is a 64 year old female, retired, disabled, lives in [place], has a history of depression, alcohol use disorder in remission, atrial fibrillation, hepatitis C was evaluated by telemedicine today.  A video call was attempted however due to connection problem it had to be changed to a phone call.  Patient today reports she is currently making progress with regards to her mood symptoms.  She is not as sad as she used to be before.  She does struggle with some anxiety on and off however it is more situational.  She reports that her neighbor passed away recently and no one knew about it until a week later since she lived alone.  She reports this kind of worried her and she got in touch with her daughter and requested her to check in on her every day just so that it does not happen to her.  She reports she and her daughter check in with each other every day.  She reports overall her mood symptoms have improved.  Patient denies any suicidality,  homicidality or perceptual disturbances.  She reports sleep is better.  She tried the mirtazapine once or twice however it did make her dizzy and groggy.  She reports a second time the side effects were better.  She however has been using it only as needed and reports sleep has improved and she does not need it much.  Patient has been compliant on her Zoloft.  She denies any other concerns today.   Visit Diagnosis:    ICD-10-CM   1. MDD (major depressive disorder), recurrent, in partial remission (HCC)  F33.41 mirtazapine (REMERON) 15 MG tablet  2. Alcohol use disorder, moderate, in sustained remission (HCC)  F10.21     Past Psychiatric History: I have reviewed past psychiatric history from my progress note on 12/11/2018  Past Medical History:  Past Medical History:  Diagnosis Date  . Alcoholic (HCC)   . Cirrhosis of liver (HCC)   . Depression   . EXCESSIVE MENSTRUATION 11/08/2006   Qualifier: Diagnosis of  By: Jillyn Hidden FNP, Mcarthur Rossetti   . GERD (gastroesophageal reflux disease)   . Hepatitis C 2011  . Hypertension     Past Surgical History:  Procedure Laterality Date  . CESAREAN SECTION      Family Psychiatric History: I have reviewed family psychiatric history from my progress note on 12/11/2018  Family History:  Family History  Problem Relation Age of Onset  . Alcohol abuse Mother   . Drug abuse Mother   .  Heart disease Father     Social History: Reviewed social history from my progress note on 12/11/2018 Social History   Socioeconomic History  . Marital status: Divorced    Spouse name: Not on file  . Number of children: 4  . Years of education: Not on file  . Highest education level: Not on file  Occupational History  . Occupation: disability  Tobacco Use  . Smoking status: Former Games developer  . Smokeless tobacco: Never Used  Substance and Sexual Activity  . Alcohol use: Not Currently    Comment: Alcoholic quit 10 years ago  . Drug use: Never  . Sexual  activity: Not Currently  Other Topics Concern  . Not on file  Social History Narrative  . Not on file   Social Determinants of Health   Financial Resource Strain: Low Risk   . Difficulty of Paying Living Expenses: Not hard at all  Food Insecurity: No Food Insecurity  . Worried About Programme researcher, broadcasting/film/video in the Last Year: Never true  . Ran Out of Food in the Last Year: Never true  Transportation Needs: Unmet Transportation Needs  . Lack of Transportation (Medical): Yes  . Lack of Transportation (Non-Medical): Yes  Physical Activity: Sufficiently Active  . Days of Exercise per Week: 6 days  . Minutes of Exercise per Session: 60 min  Stress: Stress Concern Present  . Feeling of Stress : To some extent  Social Connections: Moderately Isolated  . Frequency of Communication with Friends and Family: More than three times a week  . Frequency of Social Gatherings with Friends and Family: More than three times a week  . Attends Religious Services: Never  . Active Member of Clubs or Organizations: No  . Attends Banker Meetings: Never  . Marital Status: Divorced    Allergies:  Allergies  Allergen Reactions  . Duloxetine Other (See Comments) and Nausea Only  . Codeine     REACTION: GI upset    Metabolic Disorder Labs: Lab Results  Component Value Date   HGBA1C 5.3 08/17/2018   MPG 105 08/17/2018   No results found for: PROLACTIN Lab Results  Component Value Date   CHOL 178 08/17/2018   TRIG 165 (H) 08/17/2018   HDL 43 (L) 08/17/2018   CHOLHDL 4.1 08/17/2018   VLDL 19 02/04/2012   LDLCALC 107 (H) 08/17/2018   LDLCALC 58 02/04/2012   No results found for: TSH  Therapeutic Level Labs: No results found for: LITHIUM No results found for: VALPROATE No components found for:  CBMZ  Current Medications: Current Outpatient Medications  Medication Sig Dispense Refill  . aspirin EC 81 MG tablet Take 81 mg by mouth daily.    . ferrous sulfate 325 (65 FE) MG tablet  Take by mouth.    . furosemide (LASIX) 40 MG tablet TAKE 1 TABLET BY MOUTH EVERY DAY 90 tablet 1  . loperamide (IMODIUM A-D) 2 MG tablet Take 1 tablet (2 mg total) by mouth 4 (four) times daily as needed for diarrhea or loose stools. 30 tablet 0  . losartan (COZAAR) 25 MG tablet TAKE 1 TABLET BY MOUTH EVERY DAY 90 tablet 1  . meloxicam (MOBIC) 7.5 MG tablet Take once daily for 7 days, then once daily as needed for foot pain. 15 tablet 0  . mirtazapine (REMERON) 15 MG tablet Take 1 tablet (15 mg total) by mouth at bedtime as needed. sleep 30 tablet 1  . Multiple Vitamin (MULTI-VITAMIN) tablet Take by mouth.    Marland Kitchen  omeprazole (PRILOSEC) 20 MG capsule TAKE 1 CAPSULE BY MOUTH EVERY DAY 90 capsule 1  . propranolol (INDERAL) 40 MG tablet TAKE 1 TABLET BY MOUTH TWICE A DAY 180 tablet 1  . sertraline (ZOLOFT) 25 MG tablet TAKE 1 TABLET BY MOUTH DAILY 90 tablet 1  . sertraline (ZOLOFT) 50 MG tablet Take 1.5 tablets (75 mg total) by mouth daily. 135 tablet 0  . simethicone (GAS-X) 80 MG chewable tablet Chew 1 tablet (80 mg total) by mouth every 6 (six) hours as needed for flatulence. 30 tablet 0   No current facility-administered medications for this visit.     Musculoskeletal: Strength & Muscle Tone: UTA Gait & Station: Reports as WNL Patient leans: N/A  Psychiatric Specialty Exam: Review of Systems  Psychiatric/Behavioral: The patient is nervous/anxious.   All other systems reviewed and are negative.   There were no vitals taken for this visit.There is no height or weight on file to calculate BMI.  General Appearance: UTA  Eye Contact:  UTA  Speech:  Clear and Coherent  Volume:  Normal  Mood:  Anxious  Affect:  UTA  Thought Process:  Goal Directed and Descriptions of Associations: Intact  Orientation:  Full (Time, Place, and Person)  Thought Content: Logical   Suicidal Thoughts:  No  Homicidal Thoughts:  No  Memory:  Immediate;   Fair Recent;   Fair Remote;   Fair  Judgement:  Fair   Insight:  Fair  Psychomotor Activity:  UTA  Concentration:  Concentration: Fair and Attention Span: Fair  Recall:  [employer] of Knowledge: Fair  Language: Fair  Akathisia:  No  Handed:  Right  AIMS (if indicated): UTA  Assets:  Communication Skills Desire for Improvement Housing Social Support  ADL's:  Intact  Cognition: WNL  Sleep:  Fair   Screenings: GAD-7     Office Visit from 08/11/2018 in [facility]  Total GAD-7 Score  4    PHQ2-9     Office Visit from 01/16/2019 in [place] from 12/21/2018 in [facility] Office Visit from 12/07/2018 in [facility] Office Visit from 10/18/2018 in [facility] Office Visit from 08/17/2018 in [facility]  PHQ-2 Total Score  0  0  0  4  0  PHQ-9 Total Score  0  --  0  11  2       Assessment and Plan: Emera is a 64 year old female, disabled, lives in [place], has a history of depression, alcohol use disorder in remission, atrial fibrillation, hepatitis C was evaluated by telemedicine today.  She is biologically predisposed given her family history.  Patient also with history of substance abuse problems although currently in remission.  Patient with psychosocial stressors of the pandemic, relationship struggles currently is making progress.  She will continue to benefit from medication management and psychotherapy sessions.  Plan MDD-in partial remission Zoloft 75 mg p.o. daily Remeron, will change to 15 to 22.5 mg at bedtime as needed for sleep.  She has been using it only as needed. Continue psychotherapy sessions with Ms. Pervis Hocking.  Alcohol use disorder in remission We will continue to monitor closely .  Follow-up in clinic in 6 weeks or sooner if needed.  I have spent atleast 20 minutes non face to face with patient today. More than 50 % of the time was spent for ordering medications and test  ,psychoeducation and supportive psychotherapy and care coordination,as  well as documenting clinical information in electronic health record. This note was generated in part or whole with voice recognition software. Voice recognition is usually quite accurate but there are transcription errors that can and very often do occur. I apologize for any typographical errors that were not detected and corrected.       Jomarie Longs, MD 05/14/2019, 12:15 PM

## 2019-05-30 ENCOUNTER — Ambulatory Visit (INDEPENDENT_AMBULATORY_CARE_PROVIDER_SITE_OTHER): Payer: Medicare Other | Admitting: Psychology

## 2019-05-30 DIAGNOSIS — F331 Major depressive disorder, recurrent, moderate: Secondary | ICD-10-CM | POA: Diagnosis not present

## 2019-06-20 ENCOUNTER — Ambulatory Visit: Payer: Medicare Other | Admitting: Psychology

## 2019-06-25 ENCOUNTER — Other Ambulatory Visit: Payer: Self-pay

## 2019-06-25 ENCOUNTER — Telehealth (INDEPENDENT_AMBULATORY_CARE_PROVIDER_SITE_OTHER): Payer: Medicare Other | Admitting: Psychiatry

## 2019-06-25 ENCOUNTER — Encounter: Payer: Self-pay | Admitting: Psychiatry

## 2019-06-25 DIAGNOSIS — F3342 Major depressive disorder, recurrent, in full remission: Secondary | ICD-10-CM

## 2019-06-25 DIAGNOSIS — F1021 Alcohol dependence, in remission: Secondary | ICD-10-CM | POA: Diagnosis not present

## 2019-06-25 DIAGNOSIS — F3341 Major depressive disorder, recurrent, in partial remission: Secondary | ICD-10-CM | POA: Insufficient documentation

## 2019-06-25 MED ORDER — SERTRALINE HCL 50 MG PO TABS: 75.0000 mg | ORAL_TABLET | Freq: Every day | ORAL | 0 refills | Status: DC

## 2019-06-25 NOTE — Progress Notes (Signed)
Provider Location : [facility] Patient Location : Home  Virtual Visit via Video Note  I connected with Emily Butler on 06/25/19 at 11:00 AM EDT by a video enabled telemedicine application and verified that I am speaking with the correct person using two identifiers.   I discussed the limitations of evaluation and management by telemedicine and the availability of in person appointments. The patient expressed understanding and agreed to proceed.   I discussed the assessment and treatment plan with the patient. The patient was provided an opportunity to ask questions and all were answered. The patient agreed with the plan and demonstrated an understanding of the instructions.   The patient was advised to call back or seek an in-person evaluation if the symptoms worsen or if the condition fails to improve as anticipated.  BH MD OP Progress Note  06/25/2019 12:11 PM Emily Butler  MRN:  [id number]  Chief Complaint:  Chief Complaint    Follow-up     HPI: Emily Butler is a 64 year old female, retired, disabled, lives in [place], has a history of depression, alcohol use disorder in remission, atrial fibrillation, hepatitis C was evaluated by telemedicine today.  Patient today reports she is currently doing well on the current medication.  She denies any significant depressive symptoms.  Her mood symptoms are stable.  She is compliant on Zoloft and denies side effects.  She reports she however had to stop the mirtazapine.  She reports it caused her restless leg symptoms when she took it.  She reports now that she is not on it she does not have the restlessness at night.  She is able to sleep for 6 hours or so and that is good for her.  She does not want to add another sleep medication at this time.  She reports she continues to have support system from her therapist as well as her children.  She denies any suicidality, homicidality. She does report possible images flashing in her vision when she  closes her eyes to sleep. This does not happen often and does not bother her much. She reports she had similar problems when she was on certain medications in the past which were like hallucinations secondary to these medications. However they subsided when she stopped these medications. She does not know if this is related to her current medications however it does not bother her much. Patient agrees to monitor her symptoms closely.    Visit Diagnosis:    ICD-10-CM   1. MDD (major depressive disorder), recurrent, in full remission (HCC)  F33.42 sertraline (ZOLOFT) 50 MG tablet  2. Alcohol use disorder, moderate, in sustained remission (HCC)  F10.21     Past Psychiatric History: I have reviewed past psychiatric history from my progress note on 12/11/2018  Past Medical History:  Past Medical History:  Diagnosis Date  . Alcoholic (HCC)   . Cirrhosis of liver (HCC)   . Depression   . EXCESSIVE MENSTRUATION 11/08/2006   Qualifier: Diagnosis of  By: Jillyn Hidden FNP, Mcarthur Rossetti   . GERD (gastroesophageal reflux disease)   . Hepatitis C 2011  . Hypertension     Past Surgical History:  Procedure Laterality Date  . CESAREAN SECTION      Family Psychiatric History: I have reviewed family psychiatric history from my progress note on 12/11/2018  Family History:  Family History  Problem Relation Age of Onset  . Alcohol abuse Mother   . Drug abuse Mother   . Heart disease Father  Social History: Reviewed social history from my progress note on 12/11/2018 Social History   Socioeconomic History  . Marital status: Divorced    Spouse name: Not on file  . Number of children: 4  . Years of education: Not on file  . Highest education level: Not on file  Occupational History  . Occupation: disability  Tobacco Use  . Smoking status: Former Games developer  . Smokeless tobacco: Never Used  Substance and Sexual Activity  . Alcohol use: Not Currently    Comment: Alcoholic quit 10 years ago  .  Drug use: Never  . Sexual activity: Not Currently  Other Topics Concern  . Not on file  Social History Narrative  . Not on file   Social Determinants of Health   Financial Resource Strain: Low Risk   . Difficulty of Paying Living Expenses: Not hard at all  Food Insecurity: No Food Insecurity  . Worried About Programme researcher, broadcasting/film/video in the Last Year: Never true  . Ran Out of Food in the Last Year: Never true  Transportation Needs: Unmet Transportation Needs  . Lack of Transportation (Medical): Yes  . Lack of Transportation (Non-Medical): Yes  Physical Activity: Sufficiently Active  . Days of Exercise per Week: 6 days  . Minutes of Exercise per Session: 60 min  Stress: Stress Concern Present  . Feeling of Stress : To some extent  Social Connections: Moderately Isolated  . Frequency of Communication with Friends and Family: More than three times a week  . Frequency of Social Gatherings with Friends and Family: More than three times a week  . Attends Religious Services: Never  . Active Member of Clubs or Organizations: No  . Attends Banker Meetings: Never  . Marital Status: Divorced    Allergies:  Allergies  Allergen Reactions  . Duloxetine Other (See Comments) and Nausea Only  . Codeine     REACTION: GI upset    Metabolic Disorder Labs: Lab Results  Component Value Date   HGBA1C 5.3 08/17/2018   MPG 105 08/17/2018   No results found for: PROLACTIN Lab Results  Component Value Date   CHOL 178 08/17/2018   TRIG 165 (H) 08/17/2018   HDL 43 (L) 08/17/2018   CHOLHDL 4.1 08/17/2018   VLDL 19 02/04/2012   LDLCALC 107 (H) 08/17/2018   LDLCALC 58 02/04/2012   No results found for: TSH  Therapeutic Level Labs: No results found for: LITHIUM No results found for: VALPROATE No components found for:  CBMZ  Current Medications: Current Outpatient Medications  Medication Sig Dispense Refill  . aspirin EC 81 MG tablet Take 81 mg by mouth daily.    . ferrous  sulfate 325 (65 FE) MG tablet Take by mouth.    . furosemide (LASIX) 40 MG tablet TAKE 1 TABLET BY MOUTH EVERY DAY 90 tablet 1  . loperamide (IMODIUM A-D) 2 MG tablet Take 1 tablet (2 mg total) by mouth 4 (four) times daily as needed for diarrhea or loose stools. 30 tablet 0  . losartan (COZAAR) 25 MG tablet TAKE 1 TABLET BY MOUTH EVERY DAY 90 tablet 1  . meloxicam (MOBIC) 7.5 MG tablet Take once daily for 7 days, then once daily as needed for foot pain. 15 tablet 0  . Multiple Vitamin (MULTI-VITAMIN) tablet Take by mouth.    Marland Kitchen omeprazole (PRILOSEC) 20 MG capsule TAKE 1 CAPSULE BY MOUTH EVERY DAY 90 capsule 1  . propranolol (INDERAL) 40 MG tablet TAKE 1 TABLET BY MOUTH TWICE  A DAY 180 tablet 1  . sertraline (ZOLOFT) 50 MG tablet Take 1.5 tablets (75 mg total) by mouth daily. 135 tablet 0  . simethicone (GAS-X) 80 MG chewable tablet Chew 1 tablet (80 mg total) by mouth every 6 (six) hours as needed for flatulence. 30 tablet 0   No current facility-administered medications for this visit.     Musculoskeletal: Strength & Muscle Tone: UTA Gait & Station: normal Patient leans: N/A  Psychiatric Specialty Exam: Review of Systems  Psychiatric/Behavioral: Negative for agitation, behavioral problems, confusion, decreased concentration, dysphoric mood, hallucinations, self-injury, sleep disturbance and suicidal ideas. The patient is not nervous/anxious and is not hyperactive.   All other systems reviewed and are negative.   There were no vitals taken for this visit.There is no height or weight on file to calculate BMI.  General Appearance: Casual  Eye Contact:  Fair  Speech:  Normal Rate  Volume:  Normal  Mood:  Euthymic  Affect:  Congruent  Thought Process:  Goal Directed and Descriptions of Associations: Intact  Orientation:  Full (Time, Place, and Person)  Thought Content: Logical   Suicidal Thoughts:  No  Homicidal Thoughts:  No  Memory:  Immediate;   Fair Recent;   Fair Remote;    Fair  Judgement:  Fair  Insight:  Fair  Psychomotor Activity:  Normal  Concentration:  Concentration: Fair and Attention Span: Fair  Recall:  [employer] of Knowledge: Fair  Language: Fair  Akathisia:  No  Handed:  Right  AIMS (if indicated): UTA  Assets:  Communication Skills Desire for Improvement Housing Social Support  ADL's:  Intact  Cognition: WNL  Sleep:  Fair   Screenings: GAD-7     [facility]  Total GAD-7 Score  4    PHQ2-9     Office Visit from 01/16/2019 in [place] from 12/21/2018 in [facility] Office Visit from 12/07/2018 in [facility] Office Visit from 10/18/2018 in [facility] Office Visit from 08/17/2018 in [facility]  PHQ-2 Total Score  0  0  0  4  0  PHQ-9 Total Score  0  --  0  11  2       Assessment and Plan: Emily Butler is a 64 year old female, disabled, lives in [place], has a history of depression, alcohol use disorder in remission, atrial fibrillation, hepatitis C was evaluated by telemedicine today.  Patient is biologically predisposed given her family history.  Patient with history of substance abuse problems however it is currently in remission.  Patient is currently stable on current medication regimen.  Patient however did have adverse side effects to mirtazapine and has been noncompliant.  She is however sleeping well.  Plan as noted below.  Plan MDD in full remission Zoloft 75 mg p.o. daily Discontinue mirtazapine for side effects. Continue psychotherapy sessions with Ms. Pervis Hocking  Alcohol use disorder in remission We will monitor closely  Follow-up in clinic in 3 months or sooner if needed.  I have spent atleast 20 minutes non face to face with patient today. More than 50 % of the time was spent for preparing to see the patient ( e.g., review of test, records ), ordering  medications and test ,psychoeducation and supportive psychotherapy and care coordination,as well as documenting clinical information in electronic health record.This note was generated in part or whole with voice recognition software. Voice recognition is usually  quite accurate but there are transcription errors that can and very often do occur. I apologize for any typographical errors that were not detected and corrected.       Jomarie Longs, MD 06/25/2019, 12:11 PM

## 2019-07-03 ENCOUNTER — Ambulatory Visit (INDEPENDENT_AMBULATORY_CARE_PROVIDER_SITE_OTHER): Payer: Medicare Other | Admitting: Psychology

## 2019-07-03 ENCOUNTER — Encounter (HOSPITAL_BASED_OUTPATIENT_CLINIC_OR_DEPARTMENT_OTHER): Payer: Medicare Other | Admitting: Internal Medicine

## 2019-07-03 ENCOUNTER — Ambulatory Visit: Payer: Medicare Other | Admitting: Psychology

## 2019-07-03 DIAGNOSIS — F331 Major depressive disorder, recurrent, moderate: Secondary | ICD-10-CM | POA: Diagnosis not present

## 2019-07-10 ENCOUNTER — Encounter: Payer: Medicare Other | Admitting: Family Medicine

## 2019-08-06 ENCOUNTER — Telehealth: Payer: Self-pay

## 2019-08-06 DIAGNOSIS — I10 Essential (primary) hypertension: Secondary | ICD-10-CM

## 2019-08-06 MED ORDER — LOSARTAN POTASSIUM 25 MG PO TABS: 25.0000 mg | ORAL_TABLET | Freq: Every day | ORAL | 0 refills | Status: DC

## 2019-08-06 NOTE — Addendum Note (Signed)
Addended by: Davene Costain on: 08/06/2019 11:43 AM   Modules accepted: Orders

## 2019-08-06 NOTE — Telephone Encounter (Signed)
lvm for pt to return call to schedule appt for refills

## 2019-08-06 NOTE — Telephone Encounter (Signed)
Pt made an appt for the 18th at 2:40 but she needs a refill before then on the [place].  [facility]  CB# [phone]

## 2019-08-06 NOTE — Telephone Encounter (Signed)
Pt needs appt for refills

## 2019-08-17 ENCOUNTER — Ambulatory Visit (INDEPENDENT_AMBULATORY_CARE_PROVIDER_SITE_OTHER): Payer: Medicare Other | Admitting: Family Medicine

## 2019-08-17 ENCOUNTER — Other Ambulatory Visit: Payer: Self-pay

## 2019-08-17 ENCOUNTER — Encounter: Payer: Self-pay | Admitting: Family Medicine

## 2019-08-17 VITALS — BP 140/82 | HR 78 | Temp 97.8°F | Resp 16 | Ht 63.0 in | Wt 183.8 lb

## 2019-08-17 DIAGNOSIS — E785 Hyperlipidemia, unspecified: Secondary | ICD-10-CM

## 2019-08-17 DIAGNOSIS — E876 Hypokalemia: Secondary | ICD-10-CM

## 2019-08-17 DIAGNOSIS — R1032 Left lower quadrant pain: Secondary | ICD-10-CM

## 2019-08-17 DIAGNOSIS — W19XXXA Unspecified fall, initial encounter: Secondary | ICD-10-CM

## 2019-08-17 DIAGNOSIS — Z760 Encounter for issue of repeat prescription: Secondary | ICD-10-CM

## 2019-08-17 DIAGNOSIS — K7460 Unspecified cirrhosis of liver: Secondary | ICD-10-CM

## 2019-08-17 DIAGNOSIS — I4891 Unspecified atrial fibrillation: Secondary | ICD-10-CM

## 2019-08-17 DIAGNOSIS — I10 Essential (primary) hypertension: Secondary | ICD-10-CM

## 2019-08-17 DIAGNOSIS — D631 Anemia in chronic kidney disease: Secondary | ICD-10-CM

## 2019-08-17 DIAGNOSIS — N189 Chronic kidney disease, unspecified: Secondary | ICD-10-CM

## 2019-08-17 DIAGNOSIS — K219 Gastro-esophageal reflux disease without esophagitis: Secondary | ICD-10-CM

## 2019-08-17 MED ORDER — POTASSIUM CHLORIDE CRYS ER 20 MEQ PO TBCR: 20.0000 meq | EXTENDED_RELEASE_TABLET | Freq: Every day | ORAL | 3 refills | Status: DC

## 2019-08-17 MED ORDER — FUROSEMIDE 40 MG PO TABS: 40.0000 mg | ORAL_TABLET | Freq: Every day | ORAL | 1 refills | Status: DC

## 2019-08-17 MED ORDER — OMEPRAZOLE 20 MG PO CPDR: DELAYED_RELEASE_CAPSULE | ORAL | 3 refills | Status: DC

## 2019-08-17 MED ORDER — PROPRANOLOL HCL 40 MG PO TABS: 40.0000 mg | ORAL_TABLET | Freq: Two times a day (BID) | ORAL | 1 refills | Status: DC

## 2019-08-17 MED ORDER — LOSARTAN POTASSIUM 25 MG PO TABS: 25.0000 mg | ORAL_TABLET | Freq: Every day | ORAL | 3 refills | Status: DC

## 2019-08-17 NOTE — Progress Notes (Signed)
Patient ID: Emily Butler, female    DOB: [birth year], 64 y.o.   MRN: [id number]  PCP: Danelle Berry, PA-C  Chief Complaint  Patient presents with  . Abdominal Cramping    lower abdominal pain left side after a fall 3 weeks ago    Subjective:   Emily Butler is a 64 y.o. female, presents to clinic with CC of the following:  HPI   Right abd pain and a lump onset over the past 3 weeks, She has pain from left flank and back to left lower abd. She noticed pain and left abd swelling was worse after walking and riding her bike, she had some improvement when she rested in bed.  She has had a hernia before and she doesn't think it feels like that, but lower left quadrant area near inguinal canal feels swollen and tender  No change BMs No urinary sx She has hx of sciatica to right low back, her left back pain didn't feel the same  Pain started after fall 3 weeks ago  She has multiple other chronic conditions that she is due for follow up on and labs, she doesn't know why she made appt, has presents with CC of fall and abd pain, then at end of visit today asked for all meds refilled and labs today, also wanted to talk about all over her body pain, which is not new.   Pt's PCP has moved out of the area, she is new to me, she has multiple uncontrolled chronic conditions and last OV with labs was about 1 year ago.  Explained that she will need to f/up with different OV for routine f/up on Dx and meds, other new complaints need separate OV. Today we have spent our time briefly getting to know eachother and discussing her fall and abd pain.         Patient Active Problem List   Diagnosis Date Noted  . MDD (major depressive disorder), recurrent, in partial remission (HCC) 06/25/2019  . MDD (major depressive disorder), recurrent episode, mild (HCC) 12/11/2018  . Alcohol use disorder, moderate, in sustained remission (HCC) 12/11/2018  . Diarrhea 12/07/2018  . IFG (impaired fasting  glucose) 12/07/2018  . Current severe episode of major depressive disorder without psychotic features (HCC) 12/07/2018  . Insomnia 12/07/2018  . Anemia due to chronic kidney disease 11/06/2014  . Chronic kidney disease, unspecified 08/13/2013  . Gastroesophageal reflux disease without esophagitis 08/13/2013  . Restless legs syndrome (RLS) 05/23/2013  . Umbilical hernia 10/31/2012  . Atrial fibrillation (HCC) 07/07/2012  . Hepatic cirrhosis (HCC) 07/07/2012  . Former smoker 07/07/2012  . Hypertension 07/07/2012  . Chronic hepatitis C (HCC) 07/07/2012  . THROMBOCYTOPENIA 12/20/2006      Current Outpatient Medications:  .  aspirin EC 81 MG tablet, Take 81 mg by mouth daily., Disp: , Rfl:  .  furosemide (LASIX) 40 MG tablet, TAKE 1 TABLET BY MOUTH EVERY DAY, Disp: 90 tablet, Rfl: 1 .  loperamide (IMODIUM A-D) 2 MG tablet, Take 1 tablet (2 mg total) by mouth 4 (four) times daily as needed for diarrhea or loose stools., Disp: 30 tablet, Rfl: 0 .  losartan (COZAAR) 25 MG tablet, Take 1 tablet (25 mg total) by mouth daily., Disp: 30 tablet, Rfl: 0 .  Multiple Vitamin (MULTI-VITAMIN) tablet, Take by mouth., Disp: , Rfl:  .  omeprazole (PRILOSEC) 20 MG capsule, TAKE 1 CAPSULE BY MOUTH EVERY DAY, Disp: 90 capsule, Rfl: 1 .  propranolol (INDERAL) 40  MG tablet, TAKE 1 TABLET BY MOUTH TWICE A DAY, Disp: 180 tablet, Rfl: 1 .  sertraline (ZOLOFT) 50 MG tablet, Take 1.5 tablets (75 mg total) by mouth daily., Disp: 135 tablet, Rfl: 0 .  ferrous sulfate 325 (65 FE) MG tablet, Take by mouth. (Patient not taking: Reported on 08/17/2019), Disp: , Rfl:    Allergies  Allergen Reactions  . Duloxetine Other (See Comments) and Nausea Only  . Codeine     REACTION: GI upset     Social History   Tobacco Use  . Smoking status: Former Games developer  . Smokeless tobacco: Never Used  Vaping Use  . Vaping Use: Never used  Substance Use Topics  . Alcohol use: Not Currently    Comment: Alcoholic quit 10 years ago    . Drug use: Never      Chart Review Today: I personally reviewed active problem list, medication list, allergies, family history, social history, health maintenance, notes from last encounter, lab results, imaging with the patient/caregiver today.   Review of Systems 10 Systems reviewed and are negative for acute change except as noted in the HPI.     Objective:   Vitals:   08/17/19 1442  BP: 140/82  Pulse: 78  Resp: 16  Temp: 97.8 F (36.6 C)  TempSrc: Temporal  SpO2: 99%  Weight: 183 lb 12.8 oz (83.4 kg)  Height: 5\' 3"  (1.6 m)    Body mass index is 32.56 kg/m.  Physical Exam Vitals and nursing note reviewed.  Constitutional:      General: She is not in acute distress.    Appearance: Normal appearance. She is well-developed. She is obese. She is not ill-appearing, toxic-appearing or diaphoretic.  HENT:     Head: Normocephalic and atraumatic.     Right Ear: External ear normal.     Left Ear: External ear normal.  Eyes:     General:        Right eye: No discharge.        Left eye: No discharge.     Conjunctiva/sclera: Conjunctivae normal.  Neck:     Trachea: No tracheal deviation.  Cardiovascular:     Rate and Rhythm: Normal rate and regular rhythm.     Pulses: Normal pulses.     Heart sounds: Normal heart sounds. No murmur heard.  No friction rub. No gallop.   Pulmonary:     Effort: Pulmonary effort is normal. No respiratory distress.     Breath sounds: No stridor.  Abdominal:     General: Bowel sounds are normal. There is no distension or abdominal bruit.     Palpations: Abdomen is soft. There is no mass or pulsatile mass.     Tenderness: There is no abdominal tenderness. There is no right CVA tenderness, left CVA tenderness, guarding or rebound.     Hernia: There is no hernia in the left inguinal area or left femoral area.     Comments: No abdominal wall abnormality palpated  Musculoskeletal:     Right lower leg: No edema.     Left lower leg: No edema.   Skin:    General: Skin is warm and dry.     Coloration: Skin is not jaundiced or pale.     Findings: No rash.  Neurological:     Mental Status: She is alert.     Motor: No abnormal muscle tone.      Results for orders placed or performed in visit on 08/17/18  Hemoglobin A1c  Result  Value Ref Range   Hgb A1c MFr Bld 5.3 <5.7 % of total Hgb   Mean Plasma Glucose 105 (calc)   eAG (mmol/L) 5.8 (calc)  Lipid panel  Result Value Ref Range   Cholesterol 178 <200 mg/dL   HDL 43 (L) > OR = 50 mg/dL   Triglycerides 165 (H) <150 mg/dL   LDL Cholesterol (Calc) 107 (H) mg/dL (calc)   Total CHOL/HDL Ratio 4.1 <5.0 (calc)   Non-HDL Cholesterol (Calc) 135 (H) <130 mg/dL (calc)  HIV Antibody (routine testing w rflx)  Result Value Ref Range   HIV 1&2 Ab, 4th Generation NON-REACTIVE NON-REACTI  COMPLETE METABOLIC PANEL WITH GFR  Result Value Ref Range   Glucose, Bld 122 (H) 65 - 99 mg/dL   BUN 23 7 - 25 mg/dL   Creat 0.99 0.50 - 0.99 mg/dL   GFR, Est Non African American 61 > OR = 60 mL/min/1.52m2   GFR, Est African American 71 > OR = 60 mL/min/1.30m2   BUN/Creatinine Ratio NOT APPLICABLE 6 - 22 (calc)   Sodium 140 135 - 146 mmol/L   Potassium 3.2 (L) 3.5 - 5.3 mmol/L   Chloride 107 98 - 110 mmol/L   CO2 24 20 - 32 mmol/L   Calcium 9.3 8.6 - 10.4 mg/dL   Total Protein 6.1 6.1 - 8.1 g/dL   Albumin 4.4 3.6 - 5.1 g/dL   Globulin 1.7 (L) 1.9 - 3.7 g/dL (calc)   AG Ratio 2.6 (H) 1.0 - 2.5 (calc)   Total Bilirubin 1.0 0.2 - 1.2 mg/dL   Alkaline phosphatase (APISO) 87 37 - 153 U/L   AST 21 10 - 35 U/L   ALT 11 6 - 29 U/L  CBC with Differential/Platelet  Result Value Ref Range   WBC 5.6 3.8 - 10.8 Thousand/uL   RBC 3.65 (L) 3.80 - 5.10 Million/uL   Hemoglobin 12.0 11.7 - 15.5 g/dL   HCT 33.7 (L) 35 - 45 %   MCV 92.3 80.0 - 100.0 fL   MCH 32.9 27.0 - 33.0 pg   MCHC 35.6 32.0 - 36.0 g/dL   RDW 12.8 11.0 - 15.0 %   Platelets 146 140 - 400 Thousand/uL   MPV 11.0 7.5 - 12.5 fL   Neutro  Abs 4,183 1,500 - 7,800 cells/uL   Lymphs Abs 812 (L) 850 - 3,900 cells/uL   Absolute Monocytes 448 200 - 950 cells/uL   Eosinophils Absolute 118 15 - 500 cells/uL   Basophils Absolute 39 0 - 200 cells/uL   Neutrophils Relative % 74.7 %   Total Lymphocyte 14.5 %   Monocytes Relative 8.0 %   Eosinophils Relative 2.1 %   Basophils Relative 0.7 %       Assessment & Plan:   1. Left lower quadrant abdominal pain No concerning ttp on exam today, no signs of trauma, no evident hernia or abd wall edema, hematoma, induration. No bowel or urinary sx, normal flatus, appetite, etc.  Unclear cause of pain, possibly a strained abd muscle/oblique?  2. Fall, initial encounter Vague hx given regarding fall, no other evident injuries noted today  3. Medication refill Med refill, last OV with labs was over 1 year ago, after recently establishing, with complicated and unclear med hx, explained that pt need to do routine OV separate from acute complaints, she will need at least 2-4 OV a year to address all issues and meds, and she will be establishing with new PCP in clinic, which will take some time to get  to know each other , med hx, and get records of PMHx and get control of dx and tx plan  - losartan (COZAAR) 25 MG tablet; Take 1 tablet (25 mg total) by mouth daily.  Dispense: 90 tablet; Refill: 3 - omeprazole (PRILOSEC) 20 MG capsule; TAKE 1 CAPSULE BY MOUTH EVERY DAY  Dispense: 90 capsule; Refill: 3 - furosemide (LASIX) 40 MG tablet; Take 1 tablet (40 mg total) by mouth daily.  Dispense: 90 tablet; Refill: 1 - propranolol (INDERAL) 40 MG tablet; Take 1 tablet (40 mg total) by mouth 2 (two) times daily.  Dispense: 180 tablet; Refill: 1 - potassium chloride SA (KLOR-CON) 20 MEQ tablet; Take 1 tablet (20 mEq total) by mouth daily. Take with lasix  Dispense: 90 tablet; Refill: 3  4. Essential hypertension Borderline today with BP 140/82 Refill meds as is, PCP has left the area - will need f/up visit to  address chronic conditions/meds/tx etc - COMPLETE METABOLIC PANEL WITH GFR - losartan (COZAAR) 25 MG tablet; Take 1 tablet (25 mg total) by mouth daily.  Dispense: 90 tablet; Refill: 3 - furosemide (LASIX) 40 MG tablet; Take 1 tablet (40 mg total) by mouth daily.  Dispense: 90 tablet; Refill: 1 - propranolol (INDERAL) 40 MG tablet; Take 1 tablet (40 mg total) by mouth 2 (two) times daily.  Dispense: 180 tablet; Refill: 1  5. Gastroesophageal reflux disease without esophagitis Med refill - omeprazole (PRILOSEC) 20 MG capsule; TAKE 1 CAPSULE BY MOUTH EVERY DAY  Dispense: 90 capsule; Refill: 3  6. Atrial fibrillation, unspecified type (HCC) Noted through care everywhere 04/2018 - heart RRR today - refill meds as is, but needs OV, possibly EKG or cardiology referral to recheck? - furosemide (LASIX) 40 MG tablet; Take 1 tablet (40 mg total) by mouth daily.  Dispense: 90 tablet; Refill: 1 - propranolol (INDERAL) 40 MG tablet; Take 1 tablet (40 mg total) by mouth 2 (two) times daily.  Dispense: 180 tablet; Refill: 1  7. Hyperlipidemia, unspecified hyperlipidemia type Not on statin, recheck labs - COMPLETE METABOLIC PANEL WITH GFR - Lipid panel  8. Anemia due to chronic kidney disease, unspecified CKD stage - CBC with Differential/Platelet  9. Hepatic cirrhosis, unspecified hepatic cirrhosis type, unspecified whether ascites present (HCC) Unclear hx, will check labs - chart reviewed - Hep C 2015 - most recent LFTs have been in normal range -  OV last year with Maurice Small - summarized: Cirrhosis/History Hep C: She took Harvoni in 2016 which treated her Hep C, but she still has cirrhosis. She does not have a GI doctor that follows her for this and does not want referral at this time - she will call back if she follow up. Most recent CMP showed normal LFT's. Denies abdominal pain, scleral icterus, or jaundice. Does get occasional diarrhea - normal brown color.    10. Hypokalemia On lasix?  W/o  potassium replacement - supplement sent in with lasix - explained she should always take together unless instructed not to - COMPLETE METABOLIC PANEL WITH GFR       Danelle Berry, PA-C 08/17/19 2:55 PM

## 2019-08-18 LAB — CBC WITH DIFFERENTIAL/PLATELET
Absolute Monocytes: 546 cells/uL (ref 200–950)
Basophils Absolute: 63 cells/uL (ref 0–200)
Basophils Relative: 0.9 %
Eosinophils Absolute: 168 cells/uL (ref 15–500)
Eosinophils Relative: 2.4 %
HCT: 41 % (ref 35.0–45.0)
Hemoglobin: 14.3 g/dL (ref 11.7–15.5)
Lymphs Abs: 1204 cells/uL (ref 850–3900)
MCH: 32.4 pg (ref 27.0–33.0)
MCHC: 34.9 g/dL (ref 32.0–36.0)
MCV: 93 fL (ref 80.0–100.0)
MPV: 11.4 fL (ref 7.5–12.5)
Monocytes Relative: 7.8 %
Neutro Abs: 5019 cells/uL (ref 1500–7800)
Neutrophils Relative %: 71.7 %
Platelets: 134 10*3/uL — ABNORMAL LOW (ref 140–400)
RBC: 4.41 10*6/uL (ref 3.80–5.10)
RDW: 12.3 % (ref 11.0–15.0)
Total Lymphocyte: 17.2 %
WBC: 7 10*3/uL (ref 3.8–10.8)

## 2019-08-18 LAB — LIPID PANEL
Cholesterol: 228 mg/dL — ABNORMAL HIGH (ref <200)
HDL: 59 mg/dL (ref >=50)
LDL Cholesterol (Calc): 145 mg/dL (calc) — ABNORMAL HIGH
Non-HDL Cholesterol (Calc): 169 mg/dL (calc) — ABNORMAL HIGH (ref <130)
Total CHOL/HDL Ratio: 3.9 (calc) (ref <5.0)
Triglycerides: 121 mg/dL (ref <150)

## 2019-08-18 LAB — COMPLETE METABOLIC PANEL WITH GFR
AG Ratio: 2.7 (calc) — ABNORMAL HIGH (ref 1.0–2.5)
ALT: 13 U/L (ref 6–29)
AST: 24 U/L (ref 10–35)
Albumin: 5.1 g/dL (ref 3.6–5.1)
Alkaline phosphatase (APISO): 104 U/L (ref 37–153)
BUN/Creatinine Ratio: 36 (calc) — ABNORMAL HIGH (ref 6–22)
BUN: 36 mg/dL — ABNORMAL HIGH (ref 7–25)
CO2: 28 mmol/L (ref 20–32)
Calcium: 9.8 mg/dL (ref 8.6–10.4)
Chloride: 102 mmol/L (ref 98–110)
Creat: 1 mg/dL — ABNORMAL HIGH (ref 0.50–0.99)
GFR, Est African American: 70 mL/min/{1.73_m2} (ref >=60)
GFR, Est Non African American: 60 mL/min/{1.73_m2} (ref >=60)
Globulin: 1.9 g/dL (calc) (ref 1.9–3.7)
Glucose, Bld: 134 mg/dL — ABNORMAL HIGH (ref 65–99)
Potassium: 3.9 mmol/L (ref 3.5–5.3)
Sodium: 140 mmol/L (ref 135–146)
Total Bilirubin: 1 mg/dL (ref 0.2–1.2)
Total Protein: 7 g/dL (ref 6.1–8.1)

## 2019-08-21 ENCOUNTER — Encounter: Payer: Self-pay | Admitting: Family Medicine

## 2019-08-21 DIAGNOSIS — E785 Hyperlipidemia, unspecified: Secondary | ICD-10-CM | POA: Insufficient documentation

## 2019-08-27 ENCOUNTER — Encounter: Payer: Self-pay | Admitting: Family Medicine

## 2019-08-27 ENCOUNTER — Telehealth (INDEPENDENT_AMBULATORY_CARE_PROVIDER_SITE_OTHER): Payer: Medicare Other | Admitting: Family Medicine

## 2019-08-27 VITALS — Ht 63.0 in | Wt 183.0 lb

## 2019-08-27 DIAGNOSIS — I10 Essential (primary) hypertension: Secondary | ICD-10-CM

## 2019-08-27 DIAGNOSIS — R197 Diarrhea, unspecified: Secondary | ICD-10-CM

## 2019-08-27 DIAGNOSIS — I4891 Unspecified atrial fibrillation: Secondary | ICD-10-CM

## 2019-08-27 DIAGNOSIS — F1021 Alcohol dependence, in remission: Secondary | ICD-10-CM

## 2019-08-27 DIAGNOSIS — E785 Hyperlipidemia, unspecified: Secondary | ICD-10-CM | POA: Diagnosis not present

## 2019-08-27 DIAGNOSIS — K7460 Unspecified cirrhosis of liver: Secondary | ICD-10-CM | POA: Diagnosis not present

## 2019-08-27 DIAGNOSIS — F330 Major depressive disorder, recurrent, mild: Secondary | ICD-10-CM

## 2019-08-27 MED ORDER — LOPERAMIDE HCL 2 MG PO TABS: 2.0000 mg | ORAL_TABLET | Freq: Four times a day (QID) | ORAL | 1 refills | Status: DC | PRN

## 2019-08-27 NOTE — Progress Notes (Signed)
Name: Emily Butler   MRN: [id number]    DOB: August 27, 1955   Date:08/27/2019       Progress Note  Subjective:    I connected with  Emily Butler  on 08/27/19 at 10:20 AM EDT by a video enabled telemedicine application and verified that I am speaking with the correct person using two identifiers.  I discussed the limitations of evaluation and management by telemedicine and the availability of in person appointments. The patient expressed understanding and agreed to proceed. Staff also discussed with the patient that there may be a patient responsible charge related to this service. Patient Location: home Provider Location: [facility] Additional Individuals present: none  Chief Complaint  Patient presents with  . Hyperlipidemia    discuss abnormal labs    Emily Butler is a 64 y.o. female, presents for virtual visit for routine follow up on the conditions listed above.  Hyperlipidemia: Not on meds currently-  Some concern with prior hepatitis/cirrhosis and tolerance of statin meds Last Lipids: Lab Results  Component Value Date   CHOL 228 (H) 08/17/2019   HDL 59 08/17/2019   LDLCALC 145 (H) 08/17/2019   TRIG 121 08/17/2019   CHOLHDL 3.9 08/17/2019  The 10-year ASCVD risk score Mikey Bussing DC Jr., et al., 2013) is: 6.8%   Values used to calculate the score:     Age: 56 years     Sex: Female     Is Non-Hispanic African American: No     Diabetic: No     Tobacco smoker: No     Systolic Blood Pressure: 299 mmHg     Is BP treated: Yes     HDL Cholesterol: 59 mg/dL     Total Cholesterol: 228 mg/dL Reviewed labs/history and diet today - Denies: Chest pain, shortness of breath, myalgias, claudication  Hypertension:  Currently managed on losartan and lasix - she was not on potassium with it- She started lasix back when she was dealing with hepatitis and cirrhosis  Pt reports good med compliance and denies any SE.  No lightheadedness, hypotension, syncope. Blood pressure today - not  checked today, but checked at recent OV - higher than her prior  BP Readings from Last 5 Encounters:  08/17/19 140/82  08/17/18 126/72  06/28/18 126/76   Pt denies CP, SOB, exertional sx, LE edema, palpitation, Ha's, visual disturbances   Afib - propanolol manages, "its been a long time since I went" to cardiology  She denies palpitations  Depression/anxiety:  zoloft 75 mg - managed by Dr. Shea Evans  Depression screen [facility] 2/9 08/27/2019 08/17/2019 01/16/2019  Decreased Interest 0 0 0  Down, Depressed, Hopeless 0 0 0  PHQ - 2 Score 0 0 0  Altered sleeping 0 0 0  Tired, decreased energy 0 0 0  Change in appetite 0 0 0  Feeling bad or failure about yourself  0 0 0  Trouble concentrating 0 0 0  Moving slowly or fidgety/restless 0 0 0  Suicidal thoughts 0 0 0  PHQ-9 Score 0 0 0  Difficult doing work/chores Not difficult at all Not difficult at all Not difficult at all   Chronic intermittent diarrhea - she uses immodium prn - she asks for refill  Blood sugar was mildly high with last OV - she was not fasting, past A1C was 5.3 She reports hypoglycemic episodes   Patient Active Problem List   Diagnosis Date Noted  . Hyperlipidemia 08/21/2019  . MDD (major depressive disorder), recurrent, in partial  remission (HCC) 06/25/2019  . MDD (major depressive disorder), recurrent episode, mild (HCC) 12/11/2018  . Alcohol use disorder, moderate, in sustained remission (HCC) 12/11/2018  . Diarrhea 12/07/2018  . IFG (impaired fasting glucose) 12/07/2018  . Current severe episode of major depressive disorder without psychotic features (HCC) 12/07/2018  . Insomnia 12/07/2018  . Anemia due to chronic kidney disease 11/06/2014  . Chronic kidney disease, unspecified 08/13/2013  . Gastroesophageal reflux disease without esophagitis 08/13/2013  . Restless legs syndrome (RLS) 05/23/2013  . Umbilical hernia 10/31/2012  . Atrial fibrillation (HCC) 07/07/2012  . Hepatic cirrhosis (HCC) 07/07/2012  .  Former smoker 07/07/2012  . Hypertension 07/07/2012  . Chronic hepatitis C (HCC) 07/07/2012  . THROMBOCYTOPENIA 12/20/2006    Current Outpatient Medications:  .  aspirin EC 81 MG tablet, Take 81 mg by mouth daily., Disp: , Rfl:  .  furosemide (LASIX) 40 MG tablet, Take 1 tablet (40 mg total) by mouth daily., Disp: 90 tablet, Rfl: 1 .  loperamide (IMODIUM A-D) 2 MG tablet, Take 1 tablet (2 mg total) by mouth 4 (four) times daily as needed for diarrhea or loose stools., Disp: 30 tablet, Rfl: 0 .  losartan (COZAAR) 25 MG tablet, Take 1 tablet (25 mg total) by mouth daily., Disp: 90 tablet, Rfl: 3 .  Multiple Vitamin (MULTI-VITAMIN) tablet, Take by mouth., Disp: , Rfl:  .  omeprazole (PRILOSEC) 20 MG capsule, TAKE 1 CAPSULE BY MOUTH EVERY DAY, Disp: 90 capsule, Rfl: 3 .  potassium chloride SA (KLOR-CON) 20 MEQ tablet, Take 1 tablet (20 mEq total) by mouth daily. Take with lasix, Disp: 90 tablet, Rfl: 3 .  propranolol (INDERAL) 40 MG tablet, Take 1 tablet (40 mg total) by mouth 2 (two) times daily., Disp: 180 tablet, Rfl: 1 .  sertraline (ZOLOFT) 50 MG tablet, Take 1.5 tablets (75 mg total) by mouth daily., Disp: 135 tablet, Rfl: 0 Allergies  Allergen Reactions  . Duloxetine Other (See Comments) and Nausea Only  . Codeine     REACTION: GI upset    Past Surgical History:  Procedure Laterality Date  . CESAREAN SECTION     Family History  Problem Relation Age of Onset  . Alcohol abuse Mother   . Drug abuse Mother   . Heart disease Father    Social History   Socioeconomic History  . Marital status: Divorced    Spouse name: Not on file  . Number of children: 4  . Years of education: Not on file  . Highest education level: Not on file  Occupational History  . Occupation: disability  Tobacco Use  . Smoking status: Former Games developer  . Smokeless tobacco: Never Used  Vaping Use  . Vaping Use: Never used  Substance and Sexual Activity  . Alcohol use: Not Currently    Comment: Alcoholic  quit 10 years ago  . Drug use: Never  . Sexual activity: Not Currently  Other Topics Concern  . Not on file  Social History Narrative  . Not on file   Social Determinants of Health   Financial Resource Strain:   . Difficulty of Paying Living Expenses:   Food Insecurity:   . Worried About Programme researcher, broadcasting/film/video in the Last Year:   . Barista in the Last Year:   Transportation Needs:   . Freight forwarder (Medical):   Marland Kitchen Lack of Transportation (Non-Medical):   Physical Activity: Sufficiently Active  . Days of Exercise per Week: 6 days  . Minutes of Exercise  per Session: 60 min  Stress:   . Feeling of Stress :   Social Connections:   . Frequency of Communication with Friends and Family:   . Frequency of Social Gatherings with Friends and Family:   . Attends Religious Services:   . Active Member of Clubs or Organizations:   . Attends Banker Meetings:   Marland Kitchen Marital Status:   Intimate Partner Violence:   . Fear of Current or Ex-Partner:   . Emotionally Abused:   Marland Kitchen Physically Abused:   . Sexually Abused:     Chart Review Today: I personally reviewed active problem list, medication list, allergies, family history, social history, health maintenance, notes from last encounter, lab results, imaging with the patient/caregiver today.   Review of Systems  10 Systems reviewed and are negative for acute change except as noted in the HPI.   Objective:    Virtual encounter, vitals limited, only able to obtain the following Today's Vitals   08/27/19 1007  Weight: 183 lb (83 kg)  Height: 5\' 3"  (1.6 m)   Body mass index is 32.42 kg/m. Nursing Note and Vital Signs reviewed.  Physical Exam Vitals and nursing note reviewed.  Constitutional:      General: She is not in acute distress.    Appearance: Normal appearance. She is obese. She is not ill-appearing, toxic-appearing or diaphoretic.  HENT:     Head: Normocephalic and atraumatic.  Pulmonary:      Effort: Pulmonary effort is normal.  Skin:    Coloration: Skin is not jaundiced.  Neurological:     Mental Status: She is alert.    Pt recently examined in office 10 d prior to this virtual visit  PE limited by telephone encounter  No results found for this or any previous visit (from the past 72 hour(s)).  PHQ2/9: Depression screen [facility] 2/9 08/27/2019 08/17/2019 01/16/2019 12/21/2018 12/07/2018  Decreased Interest 0 0 0 0 0  Down, Depressed, Hopeless 0 0 0 0 0  PHQ - 2 Score 0 0 0 0 0  Altered sleeping 0 0 0 - 0  Tired, decreased energy 0 0 0 - 0  Change in appetite 0 0 0 - 0  Feeling bad or failure about yourself  0 0 0 - 0  Trouble concentrating 0 0 0 - 0  Moving slowly or fidgety/restless 0 0 0 - 0  Suicidal thoughts 0 0 0 - 0  PHQ-9 Score 0 0 0 - 0  Difficult doing work/chores Not difficult at all Not difficult at all Not difficult at all - Not difficult at all   PHQ-2/9 Result is neg, reviewed - per psych  Fall Risk: Fall Risk  08/27/2019 08/17/2019 01/16/2019 12/21/2018 12/07/2018  Falls in the past year? 1 1 0 0 0  Number falls in past yr: 0 0 0 0 0  Injury with Fall? 0 1 0 0 0  Follow up - - Falls evaluation completed Falls prevention discussed Falls evaluation completed     Assessment and Plan:   Recent OV with labs done - she presented for acute complaints but then wanted meds refilled and pt is new to me.  Labs were done - today appt was to address all labs, dx and meds   1. Essential hypertension BP at least OV was not well controlled - she did not do any BP readings today, need to continue to monitor either at home or return to office for BP recheck to see if pt requires change  in meds to manage Encouraged DASH Currently on lasix 40 mg, losartan 25 (would increase if BP continues to not be at goal), propranolol for afib?  2. Hyperlipidemia, unspecified hyperlipidemia type Lipids very elevated, worse than one year ago Lab Results  Component Value Date   CHOL  228 (H) 08/17/2019   HDL 59 08/17/2019   LDLCALC 145 (H) 08/17/2019   TRIG 121 08/17/2019   CHOLHDL 3.9 08/17/2019  The 10-year ASCVD risk score Denman George DC Jr., et al., 2013) is: 6.8%   Values used to calculate the score:     Age: 55 years     Sex: Female     Is Non-Hispanic African American: No     Diabetic: No     Tobacco smoker: No     Systolic Blood Pressure: 140 mmHg     Is BP treated: Yes     HDL Cholesterol: 59 mg/dL     Total Cholesterol: 228 mg/dL  Encouraged lifestyle/diet efforts to improve, ASCVD is 6.8% not currently requiring statin - encouraged other modifiable lifestyle changes, will continue to monitor   3. Atrial fibrillation, unspecified type (HCC) Pt not seeing cardiology, she stated propanolol is managing, on ASA, currently asx  4. Hepatic cirrhosis, unspecified hepatic cirrhosis type, unspecified whether ascites present (HCC) Unclear hx, not currently seeing GI or hepatologist  LFTs normal - reviewed chart for past tx and clearance - per chart she was previously successfully tx by Dr. Hassell Halim:  "Cirrhosis/History Hep C: She took Harvoni in 2016 which treated her Hep C, but she still has cirrhosis. She does not have a GI doctor that follows her for this and does not want referral at this time - she will call back if she follow up. Most recent CMP showed normal LFT's. Denies abdominal pain, scleral icterus, or jaundice. Does get occasional diarrhea - normal brown color." Other notes from 2013 to more recently report successful tx Has dx of cirrhosis, hx of alcohol abuse - may need to consult with local GI   - loperamide (IMODIUM A-D) 2 MG tablet; Take 1 tablet (2 mg total) by mouth 4 (four) times daily as needed for diarrhea or loose stools.  Dispense: 30 tablet; Refill: 1  5. Diarrhea, unspecified type Chronic - loperamide (IMODIUM A-D) 2 MG tablet; Take 1 tablet (2 mg total) by mouth 4 (four) times daily as needed for diarrhea or loose stools.  Dispense: 30  tablet; Refill: 1  6. MDD (major depressive disorder), recurrent episode, mild (HCC) Stable well controlled per pt - she is established with psychiatry  7. Alcohol use disorder, moderate, in sustained remission (HCC) She reports continued sobriety     I discussed the assessment and treatment plan with the patient. The patient was provided an opportunity to ask questions and all were answered. The patient agreed with the plan and demonstrated an understanding of the instructions.  The patient was advised to call back or seek an in-person evaluation if the symptoms worsen or if the condition fails to improve as anticipated.  I provided 40+  minutes of non-face-to-face time during this encounter. 25 min on phone today more than 20 to review chart, labs, document.  Danelle Berry, PA-C 08/27/19 10:41 AM

## 2019-09-26 ENCOUNTER — Other Ambulatory Visit: Payer: Self-pay

## 2019-09-26 ENCOUNTER — Other Ambulatory Visit: Payer: Self-pay | Admitting: Psychiatry

## 2019-09-26 ENCOUNTER — Encounter: Payer: Self-pay | Admitting: Psychiatry

## 2019-09-26 ENCOUNTER — Telehealth (INDEPENDENT_AMBULATORY_CARE_PROVIDER_SITE_OTHER): Payer: Medicare Other | Admitting: Psychiatry

## 2019-09-26 DIAGNOSIS — F3342 Major depressive disorder, recurrent, in full remission: Secondary | ICD-10-CM

## 2019-09-26 DIAGNOSIS — F1021 Alcohol dependence, in remission: Secondary | ICD-10-CM

## 2019-09-26 MED ORDER — SERTRALINE HCL 50 MG PO TABS: 50.0000 mg | ORAL_TABLET | Freq: Every day | ORAL | 0 refills | Status: DC

## 2019-09-26 NOTE — Progress Notes (Signed)
Provider Location : [facility] Patient Location : Home  Virtual Visit via Video Note  I connected with Emily Butler on 09/26/19 at 11:00 AM EDT by a video enabled telemedicine application and verified that I am speaking with the correct person using two identifiers.   I discussed the limitations of evaluation and management by telemedicine and the availability of in person appointments. The patient expressed understanding and agreed to proceed.    I discussed the assessment and treatment plan with the patient. The patient was provided an opportunity to ask questions and all were answered. The patient agreed with the plan and demonstrated an understanding of the instructions.   The patient was advised to call back or seek an in-person evaluation if the symptoms worsen or if the condition fails to improve as anticipated.   [facility] MD OP Progress Note  09/26/2019 12:19 PM Emily Butler  MRN:  [id number]  Chief Complaint:  Chief Complaint    Follow-up     HPI: Emily Butler is a 64 year old female, retired, disabled, lives in [place], has a history of depression, alcohol use disorder in remission, was evaluated by telemedicine today.  Patient today reports she continues to do well on the current medication regimen.  She denies any significant sadness or crying spells.  Patient reports she is compliant on the Zoloft at this dosage.  She reports sleep is good.  She reports she does have couple of nights a month when her sleep can get restless however overall she is doing okay.  Patient reports she does have these images flashing in her vision on and off when she closes her eyes or when it is dark in the room.  It does not last too long.  She reports recently she thought the blanket was her puppy however soon realized it was not.  She also saw a hummingbird which was the color of a cardinal when she was waking up from her sleep.  She reports these images does not last too long and does not  happen too often.  It does not distress her either.  Patient denies any suicidality, homicidality or perceptual disturbances.  Patient denies any substance abuse problems.  Patient denies any other concerns today.  Visit Diagnosis:    ICD-10-CM   1. MDD (major depressive disorder), recurrent, in full remission (HCC)  F33.42 sertraline (ZOLOFT) 50 MG tablet  2. Alcohol use disorder, moderate, in sustained remission (HCC)  F10.21     Past Psychiatric History: I have reviewed past psychiatric history from my progress note on 12/11/2018.  Past Medical History:  Past Medical History:  Diagnosis Date  . Alcoholic (HCC)   . Cirrhosis of liver (HCC)   . Depression   . EXCESSIVE MENSTRUATION 11/08/2006   Qualifier: Diagnosis of  By: Jillyn Hidden FNP, Mcarthur Rossetti   . GERD (gastroesophageal reflux disease)   . Hepatitis C 2011  . Hypertension     Past Surgical History:  Procedure Laterality Date  . CESAREAN SECTION      Family Psychiatric History: I have reviewed family psychiatric history from my progress note on 12/11/2018.  Family History:  Family History  Problem Relation Age of Onset  . Alcohol abuse Mother   . Drug abuse Mother   . Heart disease Father     Social History: I have reviewed social history from my progress note on 12/11/2018. Social History   Socioeconomic History  . Marital status: Divorced    Spouse name: Not on file  .  Number of children: 4  . Years of education: Not on file  . Highest education level: Not on file  Occupational History  . Occupation: disability  Tobacco Use  . Smoking status: Former Games developer  . Smokeless tobacco: Never Used  Vaping Use  . Vaping Use: Never used  Substance and Sexual Activity  . Alcohol use: Not Currently    Comment: Alcoholic quit 10 years ago  . Drug use: Never  . Sexual activity: Not Currently  Other Topics Concern  . Not on file  Social History Narrative  . Not on file   Social Determinants of Health    Financial Resource Strain:   . Difficulty of Paying Living Expenses:   Food Insecurity:   . Worried About Programme researcher, broadcasting/film/video in the Last Year:   . Barista in the Last Year:   Transportation Needs:   . Freight forwarder (Medical):   Marland Kitchen Lack of Transportation (Non-Medical):   Physical Activity: Sufficiently Active  . Days of Exercise per Week: 6 days  . Minutes of Exercise per Session: 60 min  Stress:   . Feeling of Stress :   Social Connections:   . Frequency of Communication with Friends and Family:   . Frequency of Social Gatherings with Friends and Family:   . Attends Religious Services:   . Active Member of Clubs or Organizations:   . Attends Banker Meetings:   Marland Kitchen Marital Status:     Allergies:  Allergies  Allergen Reactions  . Duloxetine Other (See Comments) and Nausea Only  . Codeine     REACTION: GI upset    Metabolic Disorder Labs: Lab Results  Component Value Date   HGBA1C 5.3 08/17/2018   MPG 105 08/17/2018   No results found for: PROLACTIN Lab Results  Component Value Date   CHOL 228 (H) 08/17/2019   TRIG 121 08/17/2019   HDL 59 08/17/2019   CHOLHDL 3.9 08/17/2019   VLDL 19 02/04/2012   LDLCALC 145 (H) 08/17/2019   LDLCALC 107 (H) 08/17/2018   No results found for: TSH  Therapeutic Level Labs: No results found for: LITHIUM No results found for: VALPROATE No components found for:  CBMZ  Current Medications: Current Outpatient Medications  Medication Sig Dispense Refill  . aspirin EC 81 MG tablet Take 81 mg by mouth daily.    . furosemide (LASIX) 40 MG tablet Take 1 tablet (40 mg total) by mouth daily. 90 tablet 1  . loperamide (IMODIUM A-D) 2 MG tablet Take 1 tablet (2 mg total) by mouth 4 (four) times daily as needed for diarrhea or loose stools. 30 tablet 1  . losartan (COZAAR) 25 MG tablet Take 1 tablet (25 mg total) by mouth daily. 90 tablet 3  . Multiple Vitamin (MULTI-VITAMIN) tablet Take by mouth.    Marland Kitchen  omeprazole (PRILOSEC) 20 MG capsule TAKE 1 CAPSULE BY MOUTH EVERY DAY 90 capsule 3  . potassium chloride SA (KLOR-CON) 20 MEQ tablet Take 1 tablet (20 mEq total) by mouth daily. Take with lasix 90 tablet 3  . propranolol (INDERAL) 40 MG tablet Take 1 tablet (40 mg total) by mouth 2 (two) times daily. 180 tablet 1  . sertraline (ZOLOFT) 50 MG tablet Take 1 tablet (50 mg total) by mouth daily. 90 tablet 0   No current facility-administered medications for this visit.     Musculoskeletal: Strength & Muscle Tone: UTA Gait & Station: normal Patient leans: N/A  Psychiatric Specialty Exam: Review  of Systems  Psychiatric/Behavioral: Negative for agitation, behavioral problems, confusion, decreased concentration, dysphoric mood, hallucinations, self-injury, sleep disturbance and suicidal ideas. The patient is not nervous/anxious and is not hyperactive.   All other systems reviewed and are negative.   There were no vitals taken for this visit.There is no height or weight on file to calculate BMI.  General Appearance: Casual  Eye Contact:  Fair  Speech:  Clear and Coherent  Volume:  Normal  Mood:  Euthymic  Affect:  Congruent  Thought Process:  Goal Directed and Descriptions of Associations: Intact  Orientation:  Full (Time, Place, and Person)  Thought Content: Logical reports seeing images which are likely illusions and not true hallucinations.  Suicidal Thoughts:  No  Homicidal Thoughts:  No  Memory:  Immediate;   Fair Recent;   Fair Remote;   Fair  Judgement:  Fair  Insight:  Fair  Psychomotor Activity:  Normal  Concentration:  Concentration: Fair and Attention Span: Fair  Recall:  [employer] of Knowledge: Fair  Language: Fair  Akathisia:  No  Handed:  Right  AIMS (if indicated): UTA  Assets:  Communication Skills Desire for Improvement Housing Social Support Talents/Skills Transportation Vocational/Educational  ADL's:  Intact  Cognition: WNL  Sleep:  Fair    Screenings: GAD-7     [facility] Visit from 08/11/2018 in [facility]  Total GAD-7 Score 4    PHQ2-9     Video Visit from 08/27/2019 in [facility] Office Visit from 08/17/2019 in [facility] Office Visit from 01/16/2019 in [facility] Clinical Support from 12/21/2018 in [facility] Office Visit from 12/07/2018 in [facility]  PHQ-2 Total Score 0 0 0 0 0  PHQ-9 Total Score 0 0 0 -- 0       Assessment and Plan: Emily Butler is a 64 year old female, lives in [place] has a history of depression, alcohol use disorder in remission, atrial fibrillation, hepatitis C was evaluated by telemedicine today.  Patient is biologically predisposed given her family history.  Patient with history of substance abuse problems however currently in remission.  Patient is currently stable on medications.  Plan as noted below.  Plan MDD in full remission Zoloft, will reduce to 50 mg p.o. daily. Continue psychotherapy sessions as needed  Alcohol use disorder in remission We will monitor closely  Follow-up in clinic in 3 to 4 months or sooner if needed.  I have spent atleast 20 minutes non face to face with patient today. More than 50 % of the time was spent for ordering medications and test ,psychoeducation and supportive psychotherapy and care coordination,as well as documenting clinical information in electronic health record. This note was generated in part or whole with voice recognition software. Voice recognition is usually quite accurate but there are transcription errors that can and very often do occur. I apologize for any typographical errors that were not detected and corrected.       Jomarie Longs, MD 09/26/2019, 12:19 PM

## 2019-09-29 ENCOUNTER — Encounter: Payer: Self-pay | Admitting: Family Medicine

## 2019-12-21 ENCOUNTER — Telehealth: Payer: Self-pay | Admitting: Family Medicine

## 2019-12-21 NOTE — Telephone Encounter (Signed)
Copied from CRM [id number]. Topic: Medicare AWV >> Dec 21, 2019 11:32 AM Claudette Laws R wrote: Reason for CRM:  [facility] has meeting - left message for patient with new appointment information -srs  Cancelled AWVS on Oct 26,2021 rescheduled to Dec 2,2021 at 3;30 pm

## 2019-12-25 ENCOUNTER — Ambulatory Visit: Payer: Medicare Other

## 2020-01-01 ENCOUNTER — Encounter: Payer: Self-pay | Admitting: Psychiatry

## 2020-01-01 ENCOUNTER — Other Ambulatory Visit: Payer: Self-pay

## 2020-01-01 ENCOUNTER — Telehealth (INDEPENDENT_AMBULATORY_CARE_PROVIDER_SITE_OTHER): Payer: Medicare Other | Admitting: Psychiatry

## 2020-01-01 DIAGNOSIS — F3342 Major depressive disorder, recurrent, in full remission: Secondary | ICD-10-CM | POA: Diagnosis not present

## 2020-01-01 DIAGNOSIS — F1021 Alcohol dependence, in remission: Secondary | ICD-10-CM | POA: Diagnosis not present

## 2020-01-01 MED ORDER — SERTRALINE HCL 50 MG PO TABS: 50.0000 mg | ORAL_TABLET | Freq: Every day | ORAL | 1 refills | Status: DC

## 2020-01-01 NOTE — Progress Notes (Signed)
Virtual Visit via Video Note  I connected with Emily Butler on 01/01/20 at 10:40 AM EDT by a video enabled telemedicine application and verified that I am speaking with the correct person using two identifiers.  Location Provider Location : [facility] Patient Location : Home  Participants: Patient , Provider   I discussed the limitations of evaluation and management by telemedicine and the availability of in person appointments. The patient expressed understanding and agreed to proceed.    I discussed the assessment and treatment plan with the patient. The patient was provided an opportunity to ask questions and all were answered. The patient agreed with the plan and demonstrated an understanding of the instructions.   The patient was advised to call back or seek an in-person evaluation if the symptoms worsen or if the condition fails to improve as anticipated.   BH MD OP Progress Note  01/01/2020 12:52 PM Emily Butler  MRN:  [id number]  Chief Complaint:  Chief Complaint    Follow-up     HPI: Emily Butler is a 64 year old female, retired, disabled, lives in [place], has a history of depression, alcohol use disorder in remission was evaluated by telemedicine today.  Patient today reports she is currently doing well on the current medication regimen.  Patient denies any significant sadness.  She reports she has been staying busy taking care of her pets, she has 2 puppies, a dog and a cat.  They keep her busy.  She has to wake up at night to take care of the puppies.  She reports sleep hence has been restless at night.  She however reports she is used to staying up late at night however is able to catch up on her sleep during the day.  She is compliant on Zoloft.  Denies any side effects.  Patient denies any suicidality, homicidality or perceptual disturbances.  She reports she no longer has the images or visions that she had previously.  She currently believes they could have been  due to a medication like Benadryl that she had taken in the past.  Patient continues to stay away from alcohol.  She denies any other concerns today.  Visit Diagnosis:    ICD-10-CM   1. MDD (major depressive disorder), recurrent, in full remission (HCC)  F33.42 sertraline (ZOLOFT) 50 MG tablet  2. Alcohol use disorder, moderate, in sustained remission (HCC)  F10.21     Past Psychiatric History: I have reviewed past psychiatric history from my progress note on 12/11/2018.  Past Medical History:  Past Medical History:  Diagnosis Date  . Alcoholic (HCC)   . Cirrhosis of liver (HCC)   . Depression   . EXCESSIVE MENSTRUATION 11/08/2006   Qualifier: Diagnosis of  By: Jillyn Hidden FNP, Mcarthur Rossetti   . GERD (gastroesophageal reflux disease)   . Hepatitis C 2011  . Hypertension     Past Surgical History:  Procedure Laterality Date  . CESAREAN SECTION      Family Psychiatric History: I have reviewed family psychiatric history from my progress note on 12/11/2018.  Family History:  Family History  Problem Relation Age of Onset  . Alcohol abuse Mother   . Drug abuse Mother   . Heart disease Father     Social History: I have reviewed social history from my progress note on 12/11/2018. Social History   Socioeconomic History  . Marital status: Divorced    Spouse name: Not on file  . Number of children: 4  . Years of education:  Not on file  . Highest education level: Not on file  Occupational History  . Occupation: disability  Tobacco Use  . Smoking status: Former Games developer  . Smokeless tobacco: Never Used  Vaping Use  . Vaping Use: Never used  Substance and Sexual Activity  . Alcohol use: Not Currently    Comment: Alcoholic quit 10 years ago  . Drug use: Never  . Sexual activity: Not Currently  Other Topics Concern  . Not on file  Social History Narrative  . Not on file   Social Determinants of Health   Financial Resource Strain:   . Difficulty of Paying Living  Expenses: Not on file  Food Insecurity:   . Worried About Programme researcher, broadcasting/film/video in the Last Year: Not on file  . Ran Out of Food in the Last Year: Not on file  Transportation Needs:   . Lack of Transportation (Medical): Not on file  . Lack of Transportation (Non-Medical): Not on file  Physical Activity:   . Days of Exercise per Week: Not on file  . Minutes of Exercise per Session: Not on file  Stress:   . Feeling of Stress : Not on file  Social Connections:   . Frequency of Communication with Friends and Family: Not on file  . Frequency of Social Gatherings with Friends and Family: Not on file  . Attends Religious Services: Not on file  . Active Member of Clubs or Organizations: Not on file  . Attends Banker Meetings: Not on file  . Marital Status: Not on file    Allergies:  Allergies  Allergen Reactions  . Duloxetine Other (See Comments) and Nausea Only  . Codeine     REACTION: GI upset    Metabolic Disorder Labs: Lab Results  Component Value Date   HGBA1C 5.3 08/17/2018   MPG 105 08/17/2018   No results found for: PROLACTIN Lab Results  Component Value Date   CHOL 228 (H) 08/17/2019   TRIG 121 08/17/2019   HDL 59 08/17/2019   CHOLHDL 3.9 08/17/2019   VLDL 19 02/04/2012   LDLCALC 145 (H) 08/17/2019   LDLCALC 107 (H) 08/17/2018   No results found for: TSH  Therapeutic Level Labs: No results found for: LITHIUM No results found for: VALPROATE No components found for:  CBMZ  Current Medications: Current Outpatient Medications  Medication Sig Dispense Refill  . aspirin EC 81 MG tablet Take 81 mg by mouth daily.    . furosemide (LASIX) 40 MG tablet Take 1 tablet (40 mg total) by mouth daily. 90 tablet 1  . loperamide (IMODIUM A-D) 2 MG tablet Take 1 tablet (2 mg total) by mouth 4 (four) times daily as needed for diarrhea or loose stools. 30 tablet 1  . losartan (COZAAR) 25 MG tablet Take 1 tablet (25 mg total) by mouth daily. 90 tablet 3  . Multiple  Vitamin (MULTI-VITAMIN) tablet Take by mouth.    Marland Kitchen omeprazole (PRILOSEC) 20 MG capsule TAKE 1 CAPSULE BY MOUTH EVERY DAY 90 capsule 3  . potassium chloride SA (KLOR-CON) 20 MEQ tablet Take 1 tablet (20 mEq total) by mouth daily. Take with lasix 90 tablet 3  . propranolol (INDERAL) 40 MG tablet Take 1 tablet (40 mg total) by mouth 2 (two) times daily. 180 tablet 1  . sertraline (ZOLOFT) 50 MG tablet Take 1 tablet (50 mg total) by mouth daily. 90 tablet 1   No current facility-administered medications for this visit.     Musculoskeletal: Strength &  Muscle Tone: UTA Gait & Station: Seated Patient leans: N/A  Psychiatric Specialty Exam: Review of Systems  Psychiatric/Behavioral: Positive for sleep disturbance (Has puppies that keep her up).  All other systems reviewed and are negative.   There were no vitals taken for this visit.There is no height or weight on file to calculate BMI.  General Appearance: Casual  Eye Contact:  Fair  Speech:  Clear and Coherent  Volume:  Normal  Mood:  Euthymic  Affect:  Congruent  Thought Process:  Goal Directed and Descriptions of Associations: Intact  Orientation:  Full (Time, Place, and Person)  Thought Content: Logical   Suicidal Thoughts:  No  Homicidal Thoughts:  No  Memory:  Immediate;   Fair Recent;   Fair Remote;   Fair  Judgement:  Fair  Insight:  Fair  Psychomotor Activity:  Normal  Concentration:  Concentration: Fair and Attention Span: Fair  Recall:  [employer] of Knowledge: Fair  Language: Fair  Akathisia:  No  Handed:  Right  AIMS (if indicated): UTA  Assets:  Communication Skills Desire for Improvement Housing Social Support  ADL's:  Intact  Cognition: WNL  Sleep:  restless but overall OK   Screenings: GAD-7     Office Visit from 08/11/2018 in [facility]  Total GAD-7 Score 4    PHQ2-9     Video Visit from 08/27/2019 in [facility] Office Visit from 08/17/2019 in [facility] Office Visit from 01/16/2019 in [facility] Clinical Support from 12/21/2018 in [facility] Office Visit from 12/07/2018 in [facility]  PHQ-2 Total Score 0 0 0 0 0  PHQ-9 Total Score 0 0 0 -- 0       Assessment and Plan: Emily Butler is a 64 year old female, lives in [place], has a history of depression, alcohol use disorder in remission, atrial fibrillation, hepatitis C was evaluated by telemedicine today.  Patient is biologically predisposed given her family history.  Patient continues to stay away from alcohol.  Patient reports mood symptoms are stable.  Plan as noted below.  Plan MDD in full remission Zoloft at reduced dose of 50 mg p.o. daily  Alcohol use disorder in remission We will monitor closely  Follow-up in clinic in 4 months or sooner if needed.  I have spent atleast 15 minutes face to face by video with patient today. More than 50 % of the time was spent for preparing to see the patient ( e.g., review of test, records ), ordering medications and test ,psychoeducation and supportive psychotherapy and care coordination,as well as documenting clinical information in electronic health record. This note was generated in part or whole with voice recognition software. Voice recognition is usually quite accurate but there are transcription errors that can and very often do occur. I apologize for any typographical errors that were not detected and corrected.       Jomarie Longs, MD 01/01/2020, 12:52 PM

## 2020-01-31 ENCOUNTER — Ambulatory Visit (INDEPENDENT_AMBULATORY_CARE_PROVIDER_SITE_OTHER): Payer: Medicare Other

## 2020-01-31 DIAGNOSIS — Z0000 Encounter for general adult medical examination without abnormal findings: Secondary | ICD-10-CM | POA: Diagnosis not present

## 2020-01-31 NOTE — Progress Notes (Signed)
Subjective:   Emily Butler is a 64 y.o. female who presents for Medicare Annual (Subsequent) preventive examination.  Virtual Visit via Telephone Note  I connected with  Emily Butler on 01/31/20 at  3:30 PM EST by telephone and verified that I am speaking with the correct person using two identifiers.  Location: Patient: home Provider: [facility] Persons participating in the virtual visit: patient/Nurse Health Advisor   I discussed the limitations, risks, security and privacy concerns of performing an evaluation and management service by telephone and the availability of in person appointments. The patient expressed understanding and agreed to proceed.  Interactive audio and video telecommunications were attempted between this nurse and patient, however failed, due to patient having technical difficulties OR patient did not have access to video capability.  We continued and completed visit with audio only.  Some vital signs may be absent or patient reported.   Reather Littler, LPN    Review of Systems     Cardiac Risk Factors include: hypertension     Objective:    Today's Vitals   01/31/20 1531  PainSc: 5    There is no height or weight on file to calculate BMI.  Advanced Directives 01/31/2020 12/21/2018 06/28/2018  Does Patient Have a Medical Advance Directive? No No No  Would patient like information on creating a medical advance directive? Yes (MAU/Ambulatory/Procedural Areas - Information given) Yes (MAU/Ambulatory/Procedural Areas - Information given) -    Current Medications (verified) Outpatient Encounter Medications as of 01/31/2020  Medication Sig  . furosemide (LASIX) 40 MG tablet Take 1 tablet (40 mg total) by mouth daily.  Marland Kitchen loperamide (IMODIUM A-D) 2 MG tablet Take 1 tablet (2 mg total) by mouth 4 (four) times daily as needed for diarrhea or loose stools.  Marland Kitchen losartan (COZAAR) 25 MG tablet Take 1 tablet (25 mg total) by mouth daily.  Marland Kitchen omeprazole (PRILOSEC) 20 MG  capsule TAKE 1 CAPSULE BY MOUTH EVERY DAY  . potassium chloride SA (KLOR-CON) 20 MEQ tablet Take 1 tablet (20 mEq total) by mouth daily. Take with lasix  . propranolol (INDERAL) 40 MG tablet Take 1 tablet (40 mg total) by mouth 2 (two) times daily.  . sertraline (ZOLOFT) 50 MG tablet Take 1 tablet (50 mg total) by mouth daily.  . Multiple Vitamin (MULTI-VITAMIN) tablet Take by mouth. (Patient not taking: Reported on 01/31/2020)  . [DISCONTINUED] aspirin EC 81 MG tablet Take 81 mg by mouth daily.   No facility-administered encounter medications on file as of 01/31/2020.    Allergies (verified) Duloxetine and Codeine   History: Past Medical History:  Diagnosis Date  . Alcoholic (HCC)   . Cirrhosis of liver (HCC)   . Depression   . EXCESSIVE MENSTRUATION 11/08/2006   Qualifier: Diagnosis of  By: Jillyn Hidden FNP, Mcarthur Rossetti   . GERD (gastroesophageal reflux disease)   . Hepatitis C 2011  . Hypertension    Past Surgical History:  Procedure Laterality Date  . CESAREAN SECTION     Family History  Problem Relation Age of Onset  . Alcohol abuse Mother   . Drug abuse Mother   . Heart disease Father    Social History   Socioeconomic History  . Marital status: Divorced    Spouse name: Not on file  . Number of children: 4  . Years of education: Not on file  . Highest education level: Not on file  Occupational History  . Occupation: disability  Tobacco Use  . Smoking status: Former Games developer  .  Smokeless tobacco: Never Used  Vaping Use  . Vaping Use: Never used  Substance and Sexual Activity  . Alcohol use: Not Currently    Comment: Alcoholic quit 10 years ago  . Drug use: Never  . Sexual activity: Not Currently  Other Topics Concern  . Not on file  Social History Narrative  . Not on file   Social Determinants of Health   Financial Resource Strain: Low Risk   . Difficulty of Paying Living Expenses: Not hard at all  Food Insecurity: No Food Insecurity  . Worried About  Programme researcher, broadcasting/film/videounning Out of Food in the Last Year: Never true  . Ran Out of Food in the Last Year: Never true  Transportation Needs: Unmet Transportation Needs  . Lack of Transportation (Medical): Yes  . Lack of Transportation (Non-Medical): Yes  Physical Activity: Inactive  . Days of Exercise per Week: 0 days  . Minutes of Exercise per Session: 0 min  Stress: No Stress Concern Present  . Feeling of Stress : Only a little  Social Connections: Socially Isolated  . Frequency of Communication with Friends and Family: More than three times a week  . Frequency of Social Gatherings with Friends and Family: More than three times a week  . Attends Religious Services: Never  . Active Member of Clubs or Organizations: No  . Attends BankerClub or Organization Meetings: Never  . Marital Status: Divorced    Tobacco Counseling Counseling given: Not Answered   Clinical Intake:  Pre-visit preparation completed: Yes  Pain : 0-10 Pain Score: 5  Pain Type: Chronic pain Pain Location: Back (left hip/sciatica) Pain Orientation: Lower Pain Descriptors / Indicators: Cramping, Aching, Discomfort, Sore Pain Onset: More than a month ago Pain Frequency: Constant     Nutritional Risks: Nausea/ vomitting/ diarrhea Diabetes: No  How often do you need to have someone help you when you read instructions, pamphlets, or other written materials from your doctor or pharmacy?: 1 - Never    Interpreter Needed?: No  Information entered by :: Reather LittlerKasey Marguarite Markov LPN   Activities of Daily Living In your present state of health, do you have any difficulty performing the following activities: 01/31/2020 08/27/2019  Hearing? N N  Comment declines hearing aids -  Vision? N N  Difficulty concentrating or making decisions? N N  Walking or climbing stairs? N N  Dressing or bathing? N N  Doing errands, shopping? N N  Preparing Food and eating ? N -  Using the Toilet? N -  In the past six months, have you accidently leaked urine? Y -   Do you have problems with loss of bowel control? N -  Managing your Medications? N -  Managing your Finances? N -  Housekeeping or managing your Housekeeping? N -  Some recent data might be hidden    Patient Care Team: Danelle Berryapia, Leisa, PA-C as PCP - General (Family Medicine)  Indicate any recent Medical Services you may have received from other than [facility] providers in the past year (date may be approximate).     Assessment:   This is a routine wellness examination for Emily Butler.  Hearing/Vision screen  Hearing Screening   125Hz  250Hz  500Hz  1000Hz  2000Hz  3000Hz  4000Hz  6000Hz  8000Hz   Right ear:           Left ear:           Comments: Pt declines hearing difficulty  Vision Screening Comments: Pt plans to [facility]  Dietary issues and exercise activities  discussed: Current Exercise Habits: The patient does not participate in regular exercise at present, Exercise limited by: orthopedic condition(s)  Goals    . Weight (lb) < 160 lb (72.6 kg)     Pt states she would like to continue to lose weight with diet and exercise.       Depression Screen PHQ 2/9 Scores 01/31/2020 08/27/2019 08/17/2019 01/16/2019 12/21/2018 12/07/2018 10/18/2018  PHQ - 2 Score 0 0 0 0 0 0 4  PHQ- 9 Score - 0 0 0 - 0 11    Fall Risk Fall Risk  01/31/2020 08/27/2019 08/17/2019 01/16/2019 12/21/2018  Falls in the past year? 0 1 1 0 0  Number falls in past yr: 0 0 0 0 0  Injury with Fall? 0 0 1 0 0  Risk for fall due to : No Fall Risks - - - -  Follow up Falls prevention discussed - - Falls evaluation completed Falls prevention discussed    Any stairs in or around the home? Yes  If so, are there any without handrails? No  Home free of loose throw rugs in walkways, pet beds, electrical cords, etc? Yes  Adequate lighting in your home to reduce risk of falls? Yes   ASSISTIVE DEVICES UTILIZED TO PREVENT FALLS:  Life alert? No  Use of a cane, walker or w/c? No  Grab bars in the bathroom?  No  Shower chair or bench in shower? No  Elevated toilet seat or a handicapped toilet? No   TIMED UP AND GO:  Was the test performed? No . Telephonic visit.   Cognitive Function: Normal cognitive status assessed by direct observation by this Nurse Health Advisor. No abnormalities found.          Immunizations Immunization History  Administered Date(s) Administered  . Hep A / Hep B 07/16/2011, 08/16/2011, 01/17/2012  . Hepatitis B 08/10/2012, 10/04/2012  . Hepatitis B, adult 02/14/2013  . Influenza Inj Mdck Quad Pf 12/02/2016, 11/24/2017  . Influenza, Seasonal, Injecte, Preservative Fre 11/14/2013  . Influenza,inj,Quad PF,6+ Mos 12/24/2015  . Influenza-Unspecified 11/24/2012  . Pneumococcal Conjugate-13 11/06/2014  . Pneumococcal Polysaccharide-23 08/10/2012  . Tdap 08/10/2012  . Zoster Recombinat (Shingrix) 02/01/2019    TDAP status: Up to date   Flu Vaccine status: Declined, Education has been provided regarding the importance of this vaccine but patient still declined. Advised may receive this vaccine at local pharmacy or Health Dept. Aware to provide a copy of the vaccination record if obtained from local pharmacy or Health Dept. Verbalized acceptance and understanding.   Pneumococcal vaccine status: Up to date   Covid-19 vaccine status: Information provided on how to obtain vaccines.   Qualifies for Shingles Vaccine? Yes   Zostavax completed No   Shingrix Completed?: Yes - due for second dose  Screening Tests Health Maintenance  Topic Date Due  . COVID-19 Vaccine (1) Never done  . PAP SMEAR-Modifier  Never done  . MAMMOGRAM  11/20/2008  . INFLUENZA VACCINE  09/30/2019  . TETANUS/TDAP  08/11/2022  . COLONOSCOPY  12/04/2023  . Hepatitis C Screening  Completed  . HIV Screening  Completed    Health Maintenance  Health Maintenance Due  Topic Date Due  . COVID-19 Vaccine (1) Never done  . PAP SMEAR-Modifier  Never done  . MAMMOGRAM  11/20/2008  . INFLUENZA  VACCINE  09/30/2019    Colorectal cancer screening: Completed 12/03/13. Repeat every 10 years   Mammogram status: Completed 2008. Repeat every year. Declines repeat screening at this time.  Bone density screening: due at ae 65  Lung Cancer Screening: (Low Dose CT Chest recommended if Age 28-80 years, 30 pack-year currently smoking OR have quit w/in 15years.) does not qualify.   Additional Screening:  Hepatitis C Screening: does qualify; Completed 07/07/12  Vision Screening: Recommended annual ophthalmology exams for early detection of glaucoma and other disorders of the eye. Is the patient up to date with their annual eye exam?  No  Who is the provider or what is the name of the office in which the patient attends annual eye exams? Plans to establish care at [facility]  Dental Screening: Recommended annual dental exams for proper oral hygiene  Community Resource Referral / Chronic Care Management: CRR required this visit?  No   CCM required this visit?  No      Plan:     I have personally reviewed and noted the following in the patient's chart:   . Medical and social history . Use of alcohol, tobacco or illicit drugs  . Current medications and supplements . Functional ability and status . Nutritional status . Physical activity . Advanced directives . List of other physicians . Hospitalizations, surgeries, and ER visits in previous 12 months . Vitals . Screenings to include cognitive, depression, and falls . Referrals and appointments  In addition, I have reviewed and discussed with patient certain preventive protocols, quality metrics, and best practice recommendations. A written personalized care plan for preventive services as well as general preventive health recommendations were provided to patient.     Reather Littler, LPN   [id number]   Nurse Notes: Pt c/o low back & left hip pain; patient advised to schedule 6 month follow up appt and labs with Leisa at  the end of the month. Pt requested cholesterol handout to be mailed with advanced directive packet since she is not taking a statin.

## 2020-01-31 NOTE — Patient Instructions (Signed)
Emily Butler , Thank you for taking time to come for your Medicare Wellness Visit. I appreciate your ongoing commitment to your health goals. Please review the following plan we discussed and let me know if I can assist you in the future.   Screening recommendations/referrals: Colonoscopy: done 12/03/13 Mammogram: done 2008. Due for repeat screening Bone Density: due at age 64 Recommended yearly ophthalmology/optometry visit for glaucoma screening and checkup Recommended yearly dental visit for hygiene and checkup  Vaccinations: Influenza vaccine: due Pneumococcal vaccine: done 11/06/14 Tdap vaccine: done 08/10/12 Shingles vaccine: Shingrix discussed; due for second dose Covid-19:  due  Advanced directives: Advance directive discussed with you today. I have provided a copy for you to complete at home and have notarized. Once this is complete please bring a copy in to our office so we can scan it into your chart.  Conditions/risks identified: Recommend healthy eating and physical activity   Next appointment: Follow up in one year for your annual wellness visit.   Preventive Care 40-64 Years, Female Preventive care refers to lifestyle choices and visits with your health care provider that can promote health and wellness. What does preventive care include?  A yearly physical exam. This is also called an annual well check.  Dental exams once or twice a year.  Routine eye exams. Ask your health care provider how often you should have your eyes checked.  Personal lifestyle choices, including:  Daily care of your teeth and gums.  Regular physical activity.  Eating a healthy diet.  Avoiding tobacco and drug use.  Limiting alcohol use.  Practicing safe sex.  Taking low-dose aspirin daily starting at age 27.  Taking vitamin and mineral supplements as recommended by your health care provider. What happens during an annual well check? The services and screenings done by your health  care provider during your annual well check will depend on your age, overall health, lifestyle risk factors, and family history of disease. Counseling  Your health care provider may ask you questions about your:  Alcohol use.  Tobacco use.  Drug use.  Emotional well-being.  Home and relationship well-being.  Sexual activity.  Eating habits.  Work and work Statistician.  Method of birth control.  Menstrual cycle.  Pregnancy history. Screening  You may have the following tests or measurements:  Height, weight, and BMI.  Blood pressure.  Lipid and cholesterol levels. These may be checked every 5 years, or more frequently if you are over 76 years old.  Skin check.  Lung cancer screening. You may have this screening every year starting at age 37 if you have a 30-pack-year history of smoking and currently smoke or have quit within the past 15 years.  Fecal occult blood test (FOBT) of the stool. You may have this test every year starting at age 83.  Flexible sigmoidoscopy or colonoscopy. You may have a sigmoidoscopy every 5 years or a colonoscopy every 10 years starting at age 63.  Hepatitis C blood test.  Hepatitis B blood test.  Sexually transmitted disease (STD) testing.  Diabetes screening. This is done by checking your blood sugar (glucose) after you have not eaten for a while (fasting). You may have this done every 1-3 years.  Mammogram. This may be done every 1-2 years. Talk to your health care provider about when you should start having regular mammograms. This may depend on whether you have a family history of breast cancer.  BRCA-related cancer screening. This may be done if you have a family  history of breast, ovarian, tubal, or peritoneal cancers.  Pelvic exam and Pap test. This may be done every 3 years starting at age 33. Starting at age 12, this may be done every 5 years if you have a Pap test in combination with an HPV test.  Bone density scan. This is  done to screen for osteoporosis. You may have this scan if you are at high risk for osteoporosis. Discuss your test results, treatment options, and if necessary, the need for more tests with your health care provider. Vaccines  Your health care provider may recommend certain vaccines, such as:  Influenza vaccine. This is recommended every year.  Tetanus, diphtheria, and acellular pertussis (Tdap, Td) vaccine. You may need a Td booster every 10 years.  Zoster vaccine. You may need this after age 52.  Pneumococcal 13-valent conjugate (PCV13) vaccine. You may need this if you have certain conditions and were not previously vaccinated.  Pneumococcal polysaccharide (PPSV23) vaccine. You may need one or two doses if you smoke cigarettes or if you have certain conditions. Talk to your health care provider about which screenings and vaccines you need and how often you need them. This information is not intended to replace advice given to you by your health care provider. Make sure you discuss any questions you have with your health care provider. Document Released: 03/14/2015 Document Revised: 11/05/2015 Document Reviewed: 12/17/2014 Elsevier Interactive Patient Education  2017 [place] Prevention in the Home Falls can cause injuries. They can happen to people of all ages. There are many things you can do to make your home safe and to help prevent falls. What can I do on the outside of my home?  Regularly fix the edges of walkways and driveways and fix any cracks.  Remove anything that might make you trip as you walk through a door, such as a raised step or threshold.  Trim any bushes or trees on the path to your home.  Use bright outdoor lighting.  Clear any walking paths of anything that might make someone trip, such as rocks or tools.  Regularly check to see if handrails are loose or broken. Make sure that both sides of any steps have handrails.  Any raised decks and  porches should have guardrails on the edges.  Have any leaves, snow, or ice cleared regularly.  Use sand or salt on walking paths during winter.  Clean up any spills in your garage right away. This includes oil or grease spills. What can I do in the bathroom?  Use night lights.  Install grab bars by the toilet and in the tub and shower. Do not use towel bars as grab bars.  Use non-skid mats or decals in the tub or shower.  If you need to sit down in the shower, use a plastic, non-slip stool.  Keep the floor dry. Clean up any water that spills on the floor as soon as it happens.  Remove soap buildup in the tub or shower regularly.  Attach bath mats securely with double-sided non-slip rug tape.  Do not have throw rugs and other things on the floor that can make you trip. What can I do in the bedroom?  Use night lights.  Make sure that you have a light by your bed that is easy to reach.  Do not use any sheets or blankets that are too big for your bed. They should not hang down onto the floor.  Have a firm chair  that has side arms. You can use this for support while you get dressed.  Do not have throw rugs and other things on the floor that can make you trip. What can I do in the kitchen?  Clean up any spills right away.  Avoid walking on wet floors.  Keep items that you use a lot in easy-to-reach places.  If you need to reach something above you, use a strong step stool that has a grab bar.  Keep electrical cords out of the way.  Do not use floor polish or wax that makes floors slippery. If you must use wax, use non-skid floor wax.  Do not have throw rugs and other things on the floor that can make you trip. What can I do with my stairs?  Do not leave any items on the stairs.  Make sure that there are handrails on both sides of the stairs and use them. Fix handrails that are broken or loose. Make sure that handrails are as long as the stairways.  Check any  carpeting to make sure that it is firmly attached to the stairs. Fix any carpet that is loose or worn.  Avoid having throw rugs at the top or bottom of the stairs. If you do have throw rugs, attach them to the floor with carpet tape.  Make sure that you have a light switch at the top of the stairs and the bottom of the stairs. If you do not have them, ask someone to add them for you. What else can I do to help prevent falls?  Wear shoes that:  Do not have high heels.  Have rubber bottoms.  Are comfortable and fit you well.  Are closed at the toe. Do not wear sandals.  If you use a stepladder:  Make sure that it is fully opened. Do not climb a closed stepladder.  Make sure that both sides of the stepladder are locked into place.  Ask someone to hold it for you, if possible.  Clearly mark and make sure that you can see:  Any grab bars or handrails.  First and last steps.  Where the edge of each step is.  Use tools that help you move around (mobility aids) if they are needed. These include:  Canes.  Walkers.  Scooters.  Crutches.  Turn on the lights when you go into a dark area. Replace any light bulbs as soon as they burn out.  Set up your furniture so you have a clear path. Avoid moving your furniture around.  If any of your floors are uneven, fix them.  If there are any pets around you, be aware of where they are.  Review your medicines with your doctor. Some medicines can make you feel dizzy. This can increase your chance of falling. Ask your doctor what other things that you can do to help prevent falls. This information is not intended to replace advice given to you by your health care provider. Make sure you discuss any questions you have with your health care provider. Document Released: 12/12/2008 Document Revised: 07/24/2015 Document Reviewed: 03/22/2014 Elsevier Interactive Patient Education  2017 [place].   Cholesterol Content in  Foods Cholesterol is a waxy, fat-like substance that helps to carry fat in the blood. The body needs cholesterol in small amounts, but too much cholesterol can cause damage to the arteries and heart. Most people should eat less than 200 milligrams (mg) of cholesterol a day. Foods with cholesterol  Cholesterol is found  in animal-based foods, such as meat, seafood, and dairy. Generally, low-fat dairy and lean meats have less cholesterol than full-fat dairy and fatty meats. The milligrams of cholesterol per serving (mg per serving) of common cholesterol-containing foods are listed below. Meat and other proteins  Egg -- one large whole egg has 186 mg.  Veal shank -- 4 oz has 141 mg.  Lean ground [place] (93% lean) -- 4 oz has 118 mg.  Fat-trimmed lamb loin -- 4 oz has 106 mg.  Lean ground beef (90% lean) -- 4 oz has 100 mg.  Lobster -- 3.5 oz has 90 mg.  Pork loin chops -- 4 oz has 86 mg.  Canned salmon -- 3.5 oz has 83 mg.  Fat-trimmed beef top loin -- 4 oz has 78 mg.  Frankfurter -- 1 frank (3.5 oz) has 77 mg.  Crab -- 3.5 oz has 71 mg.  Roasted chicken without skin, white meat -- 4 oz has 66 mg.  Light bologna -- 2 oz has 45 mg.  Deli-cut [place] -- 2 oz has 31 mg.  Canned tuna -- 3.5 oz has 31 mg.  [place] Salines -- 1 oz has 29 mg.  Oysters and mussels (raw) -- 3.5 oz has 25 mg.  Mackerel -- 1 oz has 22 mg.  Trout -- 1 oz has 20 mg.  Pork sausage -- 1 link (1 oz) has 17 mg.  Salmon -- 1 oz has 16 mg.  Tilapia -- 1 oz has 14 mg. Dairy  Soft-serve ice cream --  cup (4 oz) has 103 mg.  Whole-milk yogurt -- 1 cup (8 oz) has 29 mg.  Cheddar cheese -- 1 oz has 28 mg.  American cheese -- 1 oz has 28 mg.  Whole milk -- 1 cup (8 oz) has 23 mg.  2% milk -- 1 cup (8 oz) has 18 mg.  Cream cheese -- 1 tablespoon (Tbsp) has 15 mg.  Cottage cheese --  cup (4 oz) has 14 mg.  Low-fat (1%) milk -- 1 cup (8 oz) has 10 mg.  Sour cream -- 1 Tbsp has 8.5 mg.  Low-fat yogurt  -- 1 cup (8 oz) has 8 mg.  Nonfat Greek yogurt -- 1 cup (8 oz) has 7 mg.  Half-and-half cream -- 1 Tbsp has 5 mg. Fats and oils  Cod liver oil -- 1 tablespoon (Tbsp) has 82 mg.  Butter -- 1 Tbsp has 15 mg.  Lard -- 1 Tbsp has 14 mg.  Bacon grease -- 1 Tbsp has 14 mg.  Mayonnaise -- 1 Tbsp has 5-10 mg.  Margarine -- 1 Tbsp has 3-10 mg. Exact amounts of cholesterol in these foods may vary depending on specific ingredients and brands. Foods without cholesterol Most plant-based foods do not have cholesterol unless you combine them with a food that has cholesterol. Foods without cholesterol include:  Grains and cereals.  Vegetables.  Fruits.  Vegetable oils, such as olive, canola, and sunflower oil.  Legumes, such as peas, beans, and lentils.  Nuts and seeds.  Egg whites. Summary  The body needs cholesterol in small amounts, but too much cholesterol can cause damage to the arteries and heart.  Most people should eat less than 200 milligrams (mg) of cholesterol a day. This information is not intended to replace advice given to you by your health care provider. Make sure you discuss any questions you have with your health care provider. Document Revised: 01/28/2017 Document Reviewed: 10/12/2016 Elsevier Patient Education  [place].

## 2020-02-06 ENCOUNTER — Other Ambulatory Visit: Payer: Self-pay | Admitting: Family Medicine

## 2020-02-06 DIAGNOSIS — I10 Essential (primary) hypertension: Secondary | ICD-10-CM

## 2020-02-06 DIAGNOSIS — Z760 Encounter for issue of repeat prescription: Secondary | ICD-10-CM

## 2020-02-06 DIAGNOSIS — I4891 Unspecified atrial fibrillation: Secondary | ICD-10-CM

## 2020-03-06 ENCOUNTER — Ambulatory Visit: Payer: Medicare Other | Admitting: Internal Medicine

## 2020-04-14 ENCOUNTER — Ambulatory Visit: Payer: Self-pay | Admitting: *Deleted

## 2020-04-14 NOTE — Telephone Encounter (Signed)
FYI

## 2020-04-14 NOTE — Telephone Encounter (Signed)
Pt.notified

## 2020-04-14 NOTE — Telephone Encounter (Signed)
Patient calls with a "crampy pain" in her upper chest above her breasts. This began upon wakening this morning after a long night of coughing. Pain does not radiate and does not increase with deep breathing. No wheezing.Took tylenol this morning and has heating pad applied and feeling much better-4 on the scale. Denies SOB/Vomiting/dizzines/weakness.  Has had a cough for one week now since attending granddaughter's party. All family members who attended now with covid. She wishes to continue to isolate at home and not be tested. Care Advice: if CP worsens, any SOB/wheezing she should seek immediate evaluation at the ED.Warm tea with honey cough drops. Care Advice-For worsening or radiating pain, SOB, dizziness seek immediate evaluation at the ED. Patient would like cough mediation if possible.She does not want to get out. Pharmacy on file.  Reason for Disposition . [1] Chest pain(s) lasting a few seconds from coughing AND [2] persists > 3 days  Answer Assessment - Initial Assessment Questions 1. LOCATION: "Where does it hurt?"       crampy discomfort above her breast on both sides 2. RADIATION: "Does the pain go anywhere else?" (e.g., into neck, jaw, arms, back)     no 3. ONSET: "When did the chest pain begin?" (Minutes, hours or days)      This morning after coughing all night 4. PATTERN "Does the pain come and go, or has it been constant since it started?"  "Does it get worse with exertion?"      Unsure, feeling better after tylenol and heat pad 5. DURATION: "How long does it last" (e.g., seconds, minutes, hours)     Only this morning 6. SEVERITY: "How bad is the pain?"  (e.g., Scale 1-10; mild, moderate, or severe)    - MILD (1-3): doesn't interfere with normal activities     - MODERATE (4-7): interferes with normal activities or awakens from sleep    - SEVERE (8-10): excruciating pain, unable to do any normal activities       4 now 7. CARDIAC RISK FACTORS: "Do you have any history of heart  problems or risk factors for heart disease?" (e.g., angina, prior heart attack; diabetes, high blood pressure, high cholesterol, smoker, or strong family history of heart disease)     Takes propanolol 8. PULMONARY RISK FACTORS: "Do you have any history of lung disease?"  (e.g., blood clots in lung, asthma, emphysema, birth control pills)     no 9. CAUSE: "What do you think is causing the chest pain?"     Coughed all night 10. OTHER SYMPTOMS: "Do you have any other symptoms?" (e.g., dizziness, nausea, vomiting, sweating, fever, difficulty breathing, cough)       Cough, mild nausea 11. PREGNANCY: "Is there any chance you are pregnant?" "When was your last menstrual period?"       na  Protocols used: CHEST PAIN-A-AH

## 2020-04-14 NOTE — Telephone Encounter (Signed)
Pt can use OTC meds for her symptoms  I cannot Rx meds or treat her w/o appt/eval She can to virtual appt through [facility] if she would like. I agree with recommendations from call center - any worsening SOB or CP she needs to go get evaluated somewhere in person

## 2020-04-21 ENCOUNTER — Telehealth (INDEPENDENT_AMBULATORY_CARE_PROVIDER_SITE_OTHER): Payer: Medicare Other | Admitting: Psychiatry

## 2020-04-21 ENCOUNTER — Encounter: Payer: Self-pay | Admitting: Psychiatry

## 2020-04-21 ENCOUNTER — Other Ambulatory Visit: Payer: Self-pay

## 2020-04-21 DIAGNOSIS — F1021 Alcohol dependence, in remission: Secondary | ICD-10-CM | POA: Diagnosis not present

## 2020-04-21 DIAGNOSIS — F3342 Major depressive disorder, recurrent, in full remission: Secondary | ICD-10-CM

## 2020-04-21 NOTE — Progress Notes (Signed)
Virtual Visit via Video Note  I connected with Emily Butler on 04/21/20 at 10:30 AM EST by a video enabled telemedicine application and verified that I am speaking with the correct person using two identifiers.  Location Provider Location : [facility] Patient Location : Home  Participants: Patient , Provider   I discussed the limitations of evaluation and management by telemedicine and the availability of in person appointments. The patient expressed understanding and agreed to proceed.     I discussed the assessment and treatment plan with the patient. The patient was provided an opportunity to ask questions and all were answered. The patient agreed with the plan and demonstrated an understanding of the instructions.   The patient was advised to call back or seek an in-person evaluation if the symptoms worsen or if the condition fails to improve as anticipated.  Video connection was lost at less than 50% of the duration of the visit, at which time the remainder of the visit was completed through audio only   [facility] MD OP Progress Note  04/21/2020 8:25 PM Emily Butler  MRN:  [id number]  Chief Complaint:  Chief Complaint    Follow-up     HPI: Emily Butler is a 65 year old female, retired, disabled, lives in [place], has a history of depression, alcohol use disorder in remission was evaluated by telemedicine today.  Patient today reports she is currently recovering from COVID-19 infection.  She tested positive 3 weeks ago.  She continues to have cough, fatigue, myalgia, nausea, diarrhea although they are getting better.  Patient reports otherwise she has been doing okay with regards to her mood.  She reports her Zoloft as helpful.  She denies side effects.  She reports during the holidays she felt sad and it was hard to get through it.  She however still managed.  Patient denies any suicidality, homicidality or perceptual disturbances.  Patient denies any other concerns  today.  Visit Diagnosis:    ICD-10-CM   1. MDD (major depressive disorder), recurrent, in full remission (HCC)  F33.42   2. Alcohol use disorder, moderate, in sustained remission (HCC)  F10.21     Past Psychiatric History: I have reviewed past psychiatric history from my progress note on 12/11/2018  Past Medical History:  Past Medical History:  Diagnosis Date  . Alcoholic (HCC)   . Cirrhosis of liver (HCC)   . Depression   . EXCESSIVE MENSTRUATION 11/08/2006   Qualifier: Diagnosis of  By: Jillyn Hidden FNP, Mcarthur Rossetti   . GERD (gastroesophageal reflux disease)   . Hepatitis C 2011  . Hypertension     Past Surgical History:  Procedure Laterality Date  . CESAREAN SECTION      Family Psychiatric History: Reviewed family psychiatric history from my progress note on 12/11/2018  Family History:  Family History  Problem Relation Age of Onset  . Alcohol abuse Mother   . Drug abuse Mother   . Heart disease Father     Social History: I have reviewed social history from my progress note on 12/11/2018 Social History   Socioeconomic History  . Marital status: Divorced    Spouse name: Not on file  . Number of children: 4  . Years of education: Not on file  . Highest education level: Not on file  Occupational History  . Occupation: disability  Tobacco Use  . Smoking status: Former Games developer  . Smokeless tobacco: Never Used  Vaping Use  . Vaping Use: Never used  Substance and Sexual Activity  .  Alcohol use: Not Currently    Comment: Alcoholic quit 10 years ago  . Drug use: Never  . Sexual activity: Not Currently  Other Topics Concern  . Not on file  Social History Narrative  . Not on file   Social Determinants of Health   Financial Resource Strain: Low Risk   . Difficulty of Paying Living Expenses: Not hard at all  Food Insecurity: No Food Insecurity  . Worried About Programme researcher, broadcasting/film/video in the Last Year: Never true  . Ran Out of Food in the Last Year: Never true   Transportation Needs: Unmet Transportation Needs  . Lack of Transportation (Medical): Yes  . Lack of Transportation (Non-Medical): Yes  Physical Activity: Inactive  . Days of Exercise per Week: 0 days  . Minutes of Exercise per Session: 0 min  Stress: No Stress Concern Present  . Feeling of Stress : Only a little  Social Connections: Socially Isolated  . Frequency of Communication with Friends and Family: More than three times a week  . Frequency of Social Gatherings with Friends and Family: More than three times a week  . Attends Religious Services: Never  . Active Member of Clubs or Organizations: No  . Attends Banker Meetings: Never  . Marital Status: Divorced    Allergies:  Allergies  Allergen Reactions  . Duloxetine Other (See Comments) and Nausea Only  . Codeine     REACTION: GI upset    Metabolic Disorder Labs: Lab Results  Component Value Date   HGBA1C 5.3 08/17/2018   MPG 105 08/17/2018   No results found for: PROLACTIN Lab Results  Component Value Date   CHOL 228 (H) 08/17/2019   TRIG 121 08/17/2019   HDL 59 08/17/2019   CHOLHDL 3.9 08/17/2019   VLDL 19 02/04/2012   LDLCALC 145 (H) 08/17/2019   LDLCALC 107 (H) 08/17/2018   No results found for: TSH  Therapeutic Level Labs: No results found for: LITHIUM No results found for: VALPROATE No components found for:  CBMZ  Current Medications: Current Outpatient Medications  Medication Sig Dispense Refill  . furosemide (LASIX) 40 MG tablet TAKE 1 TABLET BY MOUTH EVERY DAY 90 tablet 0  . loperamide (IMODIUM A-D) 2 MG tablet Take 1 tablet (2 mg total) by mouth 4 (four) times daily as needed for diarrhea or loose stools. 30 tablet 1  . losartan (COZAAR) 25 MG tablet Take 1 tablet (25 mg total) by mouth daily. 90 tablet 3  . Multiple Vitamin (MULTI-VITAMIN) tablet Take by mouth. (Patient not taking: Reported on 01/31/2020)    . omeprazole (PRILOSEC) 20 MG capsule TAKE 1 CAPSULE BY MOUTH EVERY DAY  90 capsule 3  . potassium chloride SA (KLOR-CON) 20 MEQ tablet Take 1 tablet (20 mEq total) by mouth daily. Take with lasix 90 tablet 3  . propranolol (INDERAL) 40 MG tablet TAKE 1 TABLET BY MOUTH TWICE A DAY 180 tablet 0  . sertraline (ZOLOFT) 50 MG tablet Take 1 tablet (50 mg total) by mouth daily. 90 tablet 1   No current facility-administered medications for this visit.     Musculoskeletal: Strength & Muscle Tone: UTA Gait & Station: UTA Patient leans: N/A  Psychiatric Specialty Exam: Review of Systems  Constitutional: Positive for fatigue and fever.  HENT: Positive for congestion and sore throat.   Respiratory: Positive for cough.   Gastrointestinal: Positive for diarrhea and nausea.  Musculoskeletal: Positive for myalgias.  Neurological: Positive for headaches.  Psychiatric/Behavioral: Positive for sleep disturbance.  The patient is nervous/anxious.   All other systems reviewed and are negative.   There were no vitals taken for this visit.There is no height or weight on file to calculate BMI.  General Appearance: Casual  Eye Contact:  Good  Speech:  Normal Rate  Volume:  Normal  Mood:  Anxious  Affect:  Congruent  Thought Process:  Goal Directed and Descriptions of Associations: Intact  Orientation:  Full (Time, Place, and Person)  Thought Content: Logical   Suicidal Thoughts:  No  Homicidal Thoughts:  No  Memory:  Immediate;   Fair Recent;   Fair Remote;   Fair  Judgement:  Fair  Insight:  Fair  Psychomotor Activity:  Normal  Concentration:  Concentration: Fair and Attention Span: Fair  Recall:  [employer] of Knowledge: Fair  Language: Fair  Akathisia:  No  Handed:  Right  AIMS (if indicated): UTA  Assets:  Communication Skills Desire for Improvement Housing Social Support  ADL's:  Intact  Cognition: WNL  Sleep:  Fair   Screenings: GAD-7   Flowsheet Row Office Visit from 08/11/2018 in [facility]  Total GAD-7 Score 4    PHQ2-9    Flowsheet Row Video Visit from 04/21/2020 in [facility] Clinical Support from 01/31/2020 in [facility] Video Visit from 08/27/2019 in [facility] Office Visit from 08/17/2019 in [facility] Office Visit from 01/16/2019 in [facility]  PHQ-2 Total Score 0 0 0 0 0  PHQ-9 Total Score - - 0 0 0       Assessment and Plan: BARBARAANN AVANS is a 65 year old female, lives in [place], has a history of depression, alcohol use disorder in remission, atrial fibrillation, hepatitis C was evaluated by telemedicine today.  Patient is biologically predisposed given her family history.  Patient is currently recovering from COVID-19 infection and denies any significant depression or sleep problems.  Plan as noted below.  Plan MDD in full remission PHQ-2-0 Zoloft 50 mg p.o. daily  Alcohol use disorder in remission We will monitor closely  Patient has upcoming appointment with her provider for her COVID-19 symptoms.  Patient advised to follow-up with primary care provider as needed.  Follow-up in clinic in 3 months or sooner if needed.  I have spent atleast 18 minutes non face to face with patient today. More than 50 % of the time was spent for preparing to see the patient ( e.g., review of test, records ), ordering medications and test ,psychoeducation and supportive psychotherapy and care coordination,as well as documenting clinical information in electronic health record. This note was generated in part or whole with voice recognition software. Voice recognition is usually quite accurate but there are transcription errors that can and very often do occur. I apologize for any typographical errors that were not detected and corrected.        Emily Longs, MD 04/21/2020, 8:25 PM

## 2020-04-23 ENCOUNTER — Telehealth: Payer: Medicare Other | Admitting: Family Medicine

## 2020-04-30 ENCOUNTER — Other Ambulatory Visit: Payer: Self-pay | Admitting: Family Medicine

## 2020-04-30 DIAGNOSIS — I4891 Unspecified atrial fibrillation: Secondary | ICD-10-CM

## 2020-04-30 DIAGNOSIS — Z760 Encounter for issue of repeat prescription: Secondary | ICD-10-CM

## 2020-04-30 DIAGNOSIS — I10 Essential (primary) hypertension: Secondary | ICD-10-CM

## 2020-04-30 NOTE — Telephone Encounter (Signed)
appt scheduled for May with Sheliah Mends

## 2020-07-01 ENCOUNTER — Ambulatory Visit: Payer: Medicare Other | Admitting: Family Medicine

## 2020-07-15 ENCOUNTER — Encounter: Payer: Self-pay | Admitting: Psychiatry

## 2020-07-15 ENCOUNTER — Other Ambulatory Visit: Payer: Self-pay

## 2020-07-15 ENCOUNTER — Telehealth (INDEPENDENT_AMBULATORY_CARE_PROVIDER_SITE_OTHER): Payer: Medicare Other | Admitting: Psychiatry

## 2020-07-15 DIAGNOSIS — F3342 Major depressive disorder, recurrent, in full remission: Secondary | ICD-10-CM

## 2020-07-15 DIAGNOSIS — F1021 Alcohol dependence, in remission: Secondary | ICD-10-CM

## 2020-07-15 MED ORDER — SERTRALINE HCL 50 MG PO TABS: 50.0000 mg | ORAL_TABLET | Freq: Every day | ORAL | 1 refills | Status: DC

## 2020-07-15 NOTE — Progress Notes (Signed)
Virtual Visit via Video Note  I connected with Emily Butler on 07/15/20 at 10:00 AM EDT by a video enabled telemedicine application and verified that I am speaking with the correct person using two identifiers.  Location Provider Location : Office Patient Location : Home  Participants: Patient , Provider    I discussed the limitations of evaluation and management by telemedicine and the availability of in person appointments. The patient expressed understanding and agreed to proceed.    I discussed the assessment and treatment plan with the patient. The patient was provided an opportunity to ask questions and all were answered. The patient agreed with the plan and demonstrated an understanding of the instructions.   The patient was advised to call back or seek an in-person evaluation if the symptoms worsen or if the condition fails to improve as anticipated.   Video connection was lost at less than 50% of the duration of the visit, at which time the remainder of the visit was completed through audio only     [facility] MD OP Progress Note  07/15/2020 10:59 AM Emily Butler  MRN:  [id number]  Chief Complaint:  Chief Complaint    Follow-up; Anxiety     HPI: Emily Butler is a 65 year old female, retired, disabled, lives in [place], has a history of MDD, alcohol use disorder in remission was evaluated by telemedicine today.  Patient today reports she has recovered from the COVID-19 infection although she does have fatigue on and off.  She however is making progress.  Patient reports she does have a low frustration tolerance often however it does not affect her to the point that she needs medication management. Its likely anxiety induced, but she is unsure. She is also not interested in psychotherapy sessions.  She will monitor herself and let writer know if she needs more help.  She is currently compliant on her medications, denies any depression, suicidality, sleep  problems.  Patient denies side effects to medications.  Patient continues to stay away from alcohol and reports she has been sober since the past 13 years.  Patient denies any other concerns today.  Visit Diagnosis:    ICD-10-CM   1. MDD (major depressive disorder), recurrent, in full remission (HCC)  F33.42 sertraline (ZOLOFT) 50 MG tablet  2. Alcohol use disorder, moderate, in sustained remission (HCC)  F10.21     Past Psychiatric History: I have reviewed past psychiatric history from progress note on 12/11/2018  Past Medical History:  Past Medical History:  Diagnosis Date  . Alcoholic (HCC)   . Cirrhosis of liver (HCC)   . Depression   . EXCESSIVE MENSTRUATION 11/08/2006   Qualifier: Diagnosis of  By: Jillyn Hidden FNP, Mcarthur Rossetti   . GERD (gastroesophageal reflux disease)   . Hepatitis C 2011  . Hypertension     Past Surgical History:  Procedure Laterality Date  . CESAREAN SECTION      Family Psychiatric History: I have reviewed family psychiatric history from progress note on 12/11/2018  Family History:  Family History  Problem Relation Age of Onset  . Alcohol abuse Mother   . Drug abuse Mother   . Heart disease Father     Social History: I have reviewed social history from progress note on 12/11/2018 Social History   Socioeconomic History  . Marital status: Divorced    Spouse name: Not on file  . Number of children: 4  . Years of education: Not on file  . Highest education level: Not on  file  Occupational History  . Occupation: disability  Tobacco Use  . Smoking status: Former Games developer  . Smokeless tobacco: Never Used  Vaping Use  . Vaping Use: Never used  Substance and Sexual Activity  . Alcohol use: Not Currently    Comment: Alcoholic quit 10 years ago  . Drug use: Never  . Sexual activity: Not Currently  Other Topics Concern  . Not on file  Social History Narrative  . Not on file   Social Determinants of Health   Financial Resource Strain:  Low Risk   . Difficulty of Paying Living Expenses: Not hard at all  Food Insecurity: No Food Insecurity  . Worried About Programme researcher, broadcasting/film/video in the Last Year: Never true  . Ran Out of Food in the Last Year: Never true  Transportation Needs: Unmet Transportation Needs  . Lack of Transportation (Medical): Yes  . Lack of Transportation (Non-Medical): Yes  Physical Activity: Inactive  . Days of Exercise per Week: 0 days  . Minutes of Exercise per Session: 0 min  Stress: No Stress Concern Present  . Feeling of Stress : Only a little  Social Connections: Socially Isolated  . Frequency of Communication with Friends and Family: More than three times a week  . Frequency of Social Gatherings with Friends and Family: More than three times a week  . Attends Religious Services: Never  . Active Member of Clubs or Organizations: No  . Attends Banker Meetings: Never  . Marital Status: Divorced    Allergies:  Allergies  Allergen Reactions  . Duloxetine Other (See Comments) and Nausea Only  . Codeine     REACTION: GI upset    Metabolic Disorder Labs: Lab Results  Component Value Date   HGBA1C 5.3 08/17/2018   MPG 105 08/17/2018   No results found for: PROLACTIN Lab Results  Component Value Date   CHOL 228 (H) 08/17/2019   TRIG 121 08/17/2019   HDL 59 08/17/2019   CHOLHDL 3.9 08/17/2019   VLDL 19 02/04/2012   LDLCALC 145 (H) 08/17/2019   LDLCALC 107 (H) 08/17/2018   No results found for: TSH  Therapeutic Level Labs: No results found for: LITHIUM No results found for: VALPROATE No components found for:  CBMZ  Current Medications: Current Outpatient Medications  Medication Sig Dispense Refill  . furosemide (LASIX) 40 MG tablet TAKE 1 TABLET BY MOUTH EVERY DAY (Patient not taking: Reported on 07/15/2020) 90 tablet 0  . loperamide (IMODIUM A-D) 2 MG tablet Take 1 tablet (2 mg total) by mouth 4 (four) times daily as needed for diarrhea or loose stools. 30 tablet 1  .  losartan (COZAAR) 25 MG tablet Take 1 tablet (25 mg total) by mouth daily. 90 tablet 3  . Multiple Vitamin (MULTI-VITAMIN) tablet Take by mouth. (Patient not taking: Reported on 01/31/2020)    . omeprazole (PRILOSEC) 20 MG capsule TAKE 1 CAPSULE BY MOUTH EVERY DAY 90 capsule 3  . potassium chloride SA (KLOR-CON) 20 MEQ tablet Take 1 tablet (20 mEq total) by mouth daily. Take with lasix (Patient not taking: Reported on 07/15/2020) 90 tablet 3  . propranolol (INDERAL) 40 MG tablet TAKE 1 TABLET BY MOUTH TWICE A DAY 180 tablet 0  . sertraline (ZOLOFT) 50 MG tablet Take 1 tablet (50 mg total) by mouth daily. 90 tablet 1   No current facility-administered medications for this visit.     Musculoskeletal: Strength & Muscle Tone: UTA Gait & Station: UTA Patient leans: N/A  Psychiatric Specialty Exam: Review of Systems  Psychiatric/Behavioral: The patient is nervous/anxious.   All other systems reviewed and are negative.   There were no vitals taken for this visit.There is no height or weight on file to calculate BMI.  General Appearance: Well Groomed  Eye Contact:  Fair  Speech:  Clear and Coherent  Volume:  Normal  Mood:  Anxious coping okay  Affect:  Congruent  Thought Process:  Goal Directed and Descriptions of Associations: Intact  Orientation:  Full (Time, Place, and Person)  Thought Content: Logical   Suicidal Thoughts:  No  Homicidal Thoughts:  No  Memory:  Immediate;   Fair Recent;   Fair Remote;   Fair  Judgement:  Fair  Insight:  Fair  Psychomotor Activity:  Normal  Concentration:  Concentration: Fair and Attention Span: Fair  Recall:  [employer] of Knowledge: Fair  Language: Fair  Akathisia:  No  Handed:  Right  AIMS (if indicated): UTA  Assets:  Communication Skills Desire for Improvement Housing Social Support  ADL's:  Intact  Cognition: WNL  Sleep:  Fair   Screenings: GAD-7   Flowsheet Row Office Visit from 08/11/2018 in [facility]   Total GAD-7 Score 4    PHQ2-9   Flowsheet Row Video Visit from 07/15/2020 in [facility] Video Visit from 04/21/2020 in [facility] Clinical Support from 01/31/2020 in [facility] Video Visit from 08/27/2019 in [facility] Office Visit from 08/17/2019 in [facility]  PHQ-2 Total Score 0 0 0 0 0  PHQ-9 Total Score 0 -- -- 0 0       Assessment and Plan: Emily Butler is a 65 year old female, [place], has a history of depression, alcohol use disorder in remission, atrial fibrillation, hepatitis C was evaluated by telemedicine today.  Patient is currently stable on current medication regimen.  Plan as noted below.  Plan MDD in full remission PHQ 9 today equals 0 Zoloft 50 mg p.o. daily  Alcohol use disorder in remission She has been sober since the past 13 years  Patient does report irritability on and off, likely anxiety induced, she however reports she is not interested in any changes in her medications and is not interested in psychotherapy sessions at this time.  She will monitor her symptoms closely and will let writer know.  Follow-up in clinic in office in 2 months from now.   I have spent at least 20 minutes non face to face with patient today.  This note was generated in part or whole with voice recognition software. Voice recognition is usually quite accurate but there are transcription errors that can and very often do occur. I apologize for any typographical errors that were not detected and corrected.            Jomarie Longs, MD 07/15/2020, 10:59 AM

## 2020-08-02 ENCOUNTER — Other Ambulatory Visit: Payer: Self-pay | Admitting: Family Medicine

## 2020-08-02 DIAGNOSIS — Z760 Encounter for issue of repeat prescription: Secondary | ICD-10-CM

## 2020-08-02 DIAGNOSIS — K219 Gastro-esophageal reflux disease without esophagitis: Secondary | ICD-10-CM

## 2020-08-02 DIAGNOSIS — I10 Essential (primary) hypertension: Secondary | ICD-10-CM

## 2020-08-02 DIAGNOSIS — I4891 Unspecified atrial fibrillation: Secondary | ICD-10-CM

## 2020-08-02 NOTE — Telephone Encounter (Signed)
Requested medications are due for refill today yes  Requested medications are on the active medication list yes  Last refill 3/2  Last visit 07/2019  Future visit scheduled no  Notes to clinic Had appt and pt canceled it, no upcoming appt scheduled.

## 2020-08-03 IMAGING — CT CT ABDOMEN AND PELVIS WITH CONTRAST
2 of 5 series · 16 of 46 positions shown, 18 images · IV contrast (omnipaque)
Comparison: CT Abdomen and Pelvis 02/03/2012.

CLINICAL DATA: 63-year-old female with progressive back pain and
generalized abdominal pain x3 weeks.

EXAM:
CT ABDOMEN AND PELVIS WITH CONTRAST
TECHNIQUE: Multidetector CT imaging of the abdomen and pelvis was performed
using the standard protocol following bolus administration of
intravenous contrast.
CONTRAST:  100mL OMNIPAQUE IOHEXOL 300 MG/ML  SOLN

[Series 2: routine abd/pel with · axial · 0.92mm/px · z∈[-455,-80]mm · 13 of 85 slices shown, 15 images]
[im 5/85  soft-tissue]
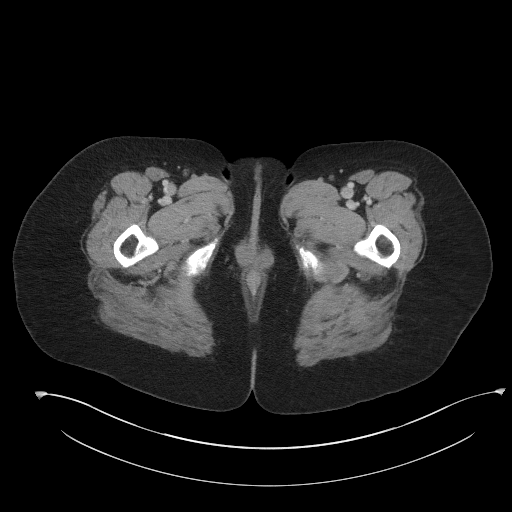
[im 5/85  bone]
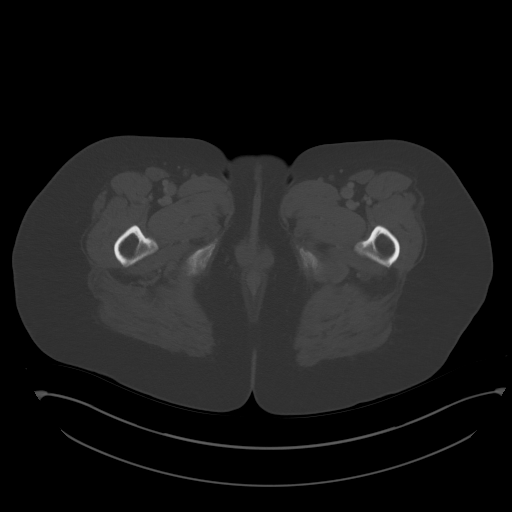
[im 14/85  soft-tissue]
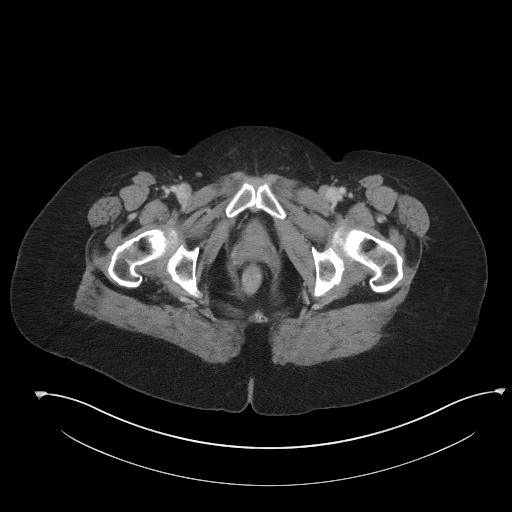
[im 18/85  soft-tissue]
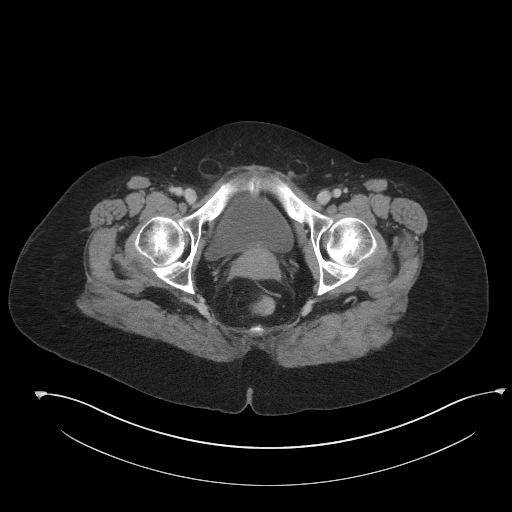
[im 23/85  soft-tissue]
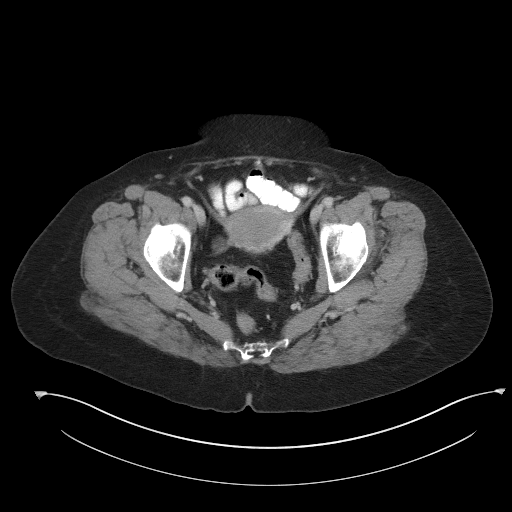
[im 31/85  soft-tissue]
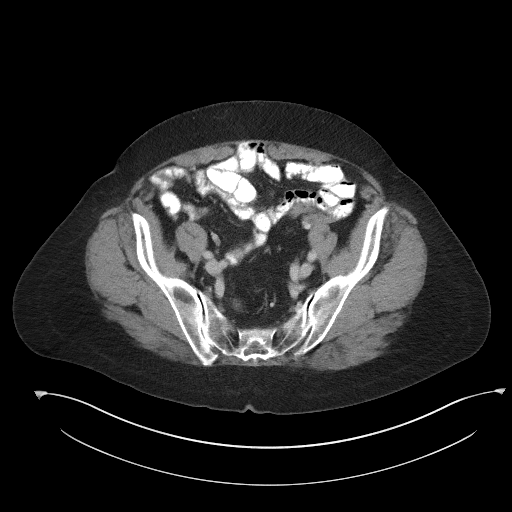
[im 36/85  soft-tissue]
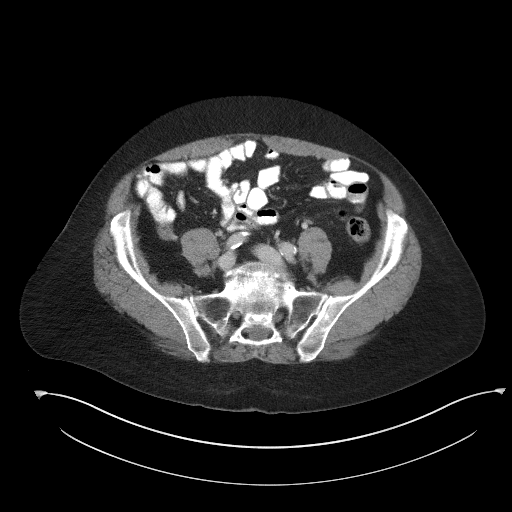
[im 45/85  soft-tissue]
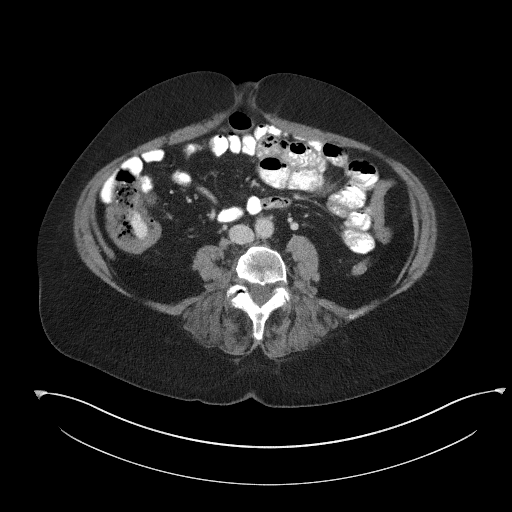
[im 49/85  soft-tissue]
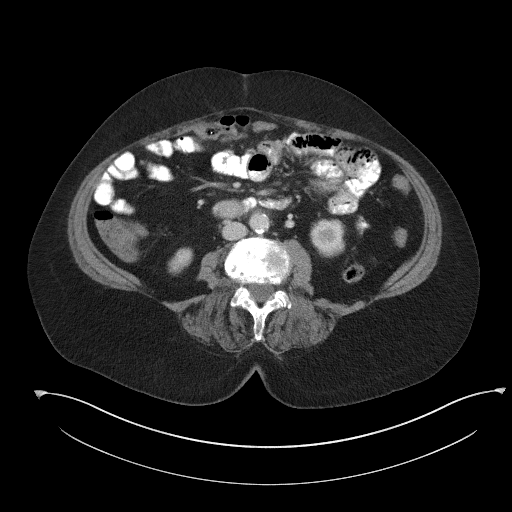
[im 54/85  soft-tissue]
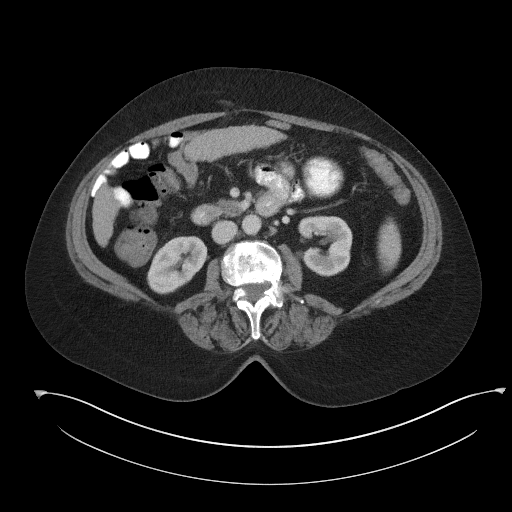
[im 54/85  bone]
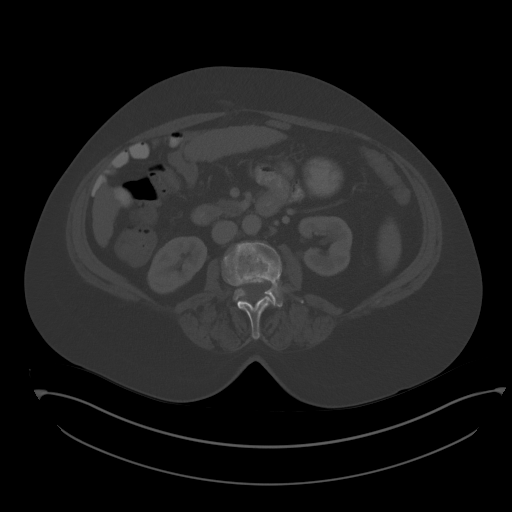
[im 62/85  soft-tissue]
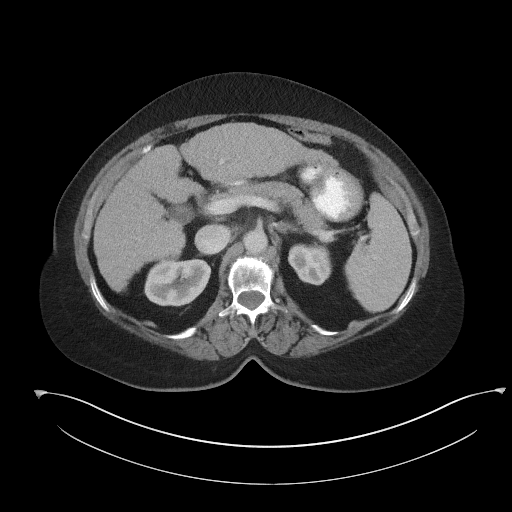
[im 67/85  soft-tissue]
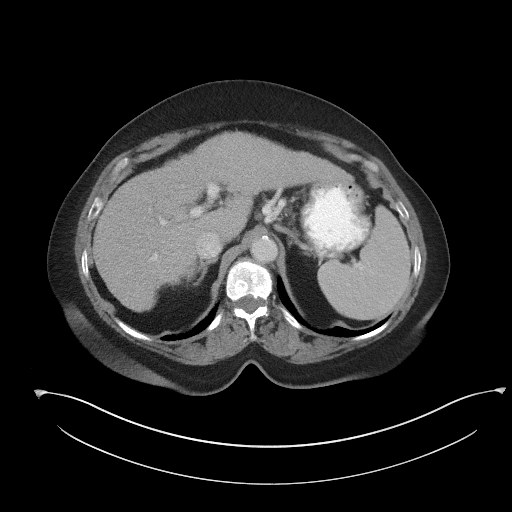
[im 71/85  soft-tissue]
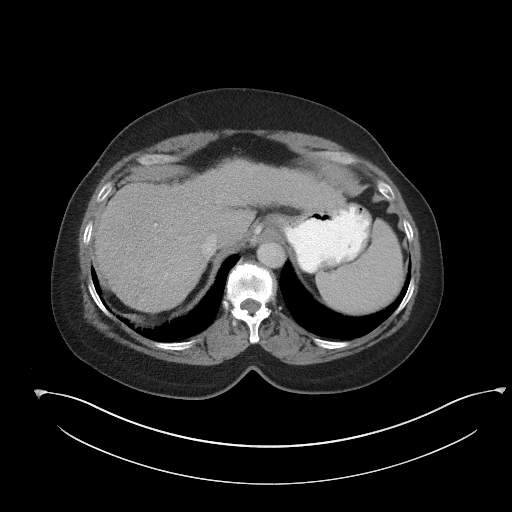
[im 80/85  soft-tissue]
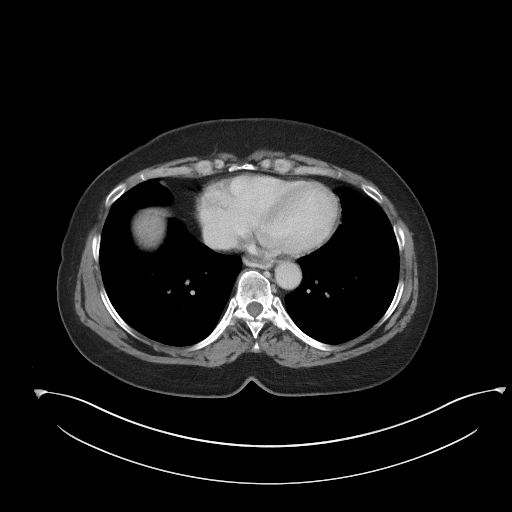

[Series 5: coronal st · coronal · 0.83mm/px · 3 of 97 slices shown]
[im 33/97  soft-tissue]
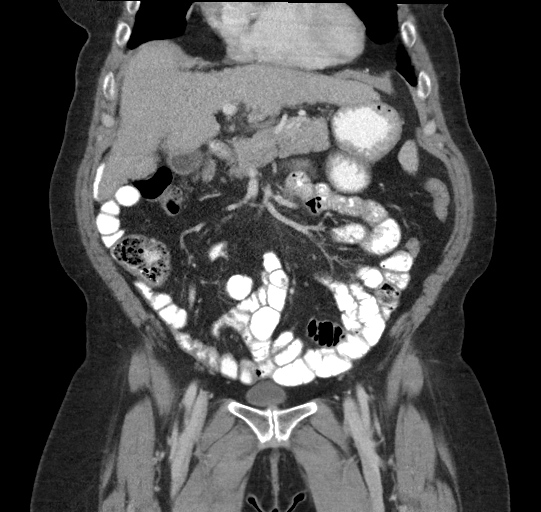
[im 43/97  soft-tissue]
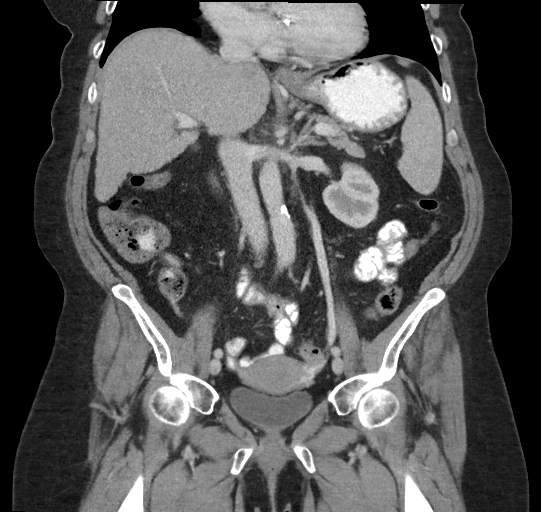
[im 54/97  soft-tissue]
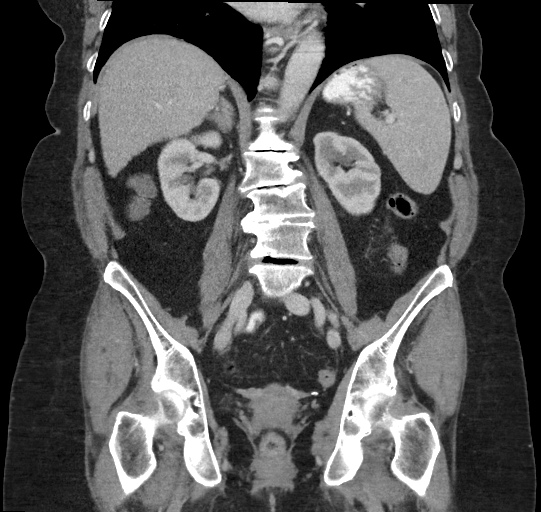

[16 of 46 positions shown; findings below may reference images not displayed]

FINDINGS: Lower chest: Paraesophageal varices (series 2, image 9). No
cardiomegaly, pericardial effusion or pleural effusion. Minor lung
base scarring or atelectasis similar to 8228.

Hepatobiliary: Nodular and cirrhotic liver. No discrete liver
lesion. Negative gallbladder. No bile duct enlargement.

Pancreas: Negative.

Spleen: Regressed and largely resolved splenomegaly since 8228.

Adrenals/Urinary Tract: Normal adrenal glands. Bilateral renal
enhancement and contrast excretion is symmetric and within normal
limits. Normal proximal ureters. Diminutive and unremarkable urinary
bladder.

Stomach/Bowel: Decompressed rectosigmoid colon. Mildly redundant
sigmoid with mild diverticulosis. No active inflammation.
Decompressed transverse and descending colon with mild redundancy.
Negative right colon. Oral contrast has just reached the cecum.
Normal retrocecal appendix (series 2, image 51).

Negative terminal ileum. No dilated small bowel. Sayfa off a G ule
and gastrohepatic ligament varices, otherwise negative stomach and
duodenum.

No free air, free fluid.

Vascular/Lymphatic: Aortoiliac calcified atherosclerosis. Major
arterial structures are patent. Portal venous system is patent.

Gastrohepatic ligament and Paraesophageal varices appear increased
since 8228.

No lymphadenopathy.

Reproductive: Negative.

Other: No pelvic free fluid.

Musculoskeletal: Widespread severe disc degeneration. Mild
scoliosis. Chronic L5 pars fractures and spondylolisthesis with
ankylosed L5-S1 bodies. No acute osseous abnormality identified.
IMPRESSION: 1. Cirrhosis with stigmata of portal venous hypertension including
paraesophageal and gastrohepatic Varices.
However, splenomegaly has regressed since 8228 and there is no
ascites today.
2. No acute or inflammatory process identified in the abdomen or
pelvis. Normal appendix.
3. Severe spinal disc degeneration. Chronic L5-S1 anterolisthesis
and ankylosis.

## 2020-08-04 ENCOUNTER — Ambulatory Visit (INDEPENDENT_AMBULATORY_CARE_PROVIDER_SITE_OTHER): Payer: Medicare Other | Admitting: Emergency Medicine

## 2020-08-04 ENCOUNTER — Encounter: Payer: Self-pay | Admitting: Emergency Medicine

## 2020-08-04 ENCOUNTER — Other Ambulatory Visit: Payer: Self-pay

## 2020-08-04 VITALS — BP 116/78 | HR 71 | Temp 98.6°F | Ht 63.0 in | Wt 179.4 lb

## 2020-08-04 DIAGNOSIS — Z7689 Persons encountering health services in other specified circumstances: Secondary | ICD-10-CM

## 2020-08-04 DIAGNOSIS — Z760 Encounter for issue of repeat prescription: Secondary | ICD-10-CM

## 2020-08-04 DIAGNOSIS — K429 Umbilical hernia without obstruction or gangrene: Secondary | ICD-10-CM

## 2020-08-04 DIAGNOSIS — F32A Depression, unspecified: Secondary | ICD-10-CM

## 2020-08-04 DIAGNOSIS — Z23 Encounter for immunization: Secondary | ICD-10-CM

## 2020-08-04 DIAGNOSIS — I10 Essential (primary) hypertension: Secondary | ICD-10-CM

## 2020-08-04 DIAGNOSIS — K7460 Unspecified cirrhosis of liver: Secondary | ICD-10-CM | POA: Diagnosis not present

## 2020-08-04 DIAGNOSIS — F1021 Alcohol dependence, in remission: Secondary | ICD-10-CM

## 2020-08-04 DIAGNOSIS — I4891 Unspecified atrial fibrillation: Secondary | ICD-10-CM

## 2020-08-04 DIAGNOSIS — F3341 Major depressive disorder, recurrent, in partial remission: Secondary | ICD-10-CM

## 2020-08-04 DIAGNOSIS — K219 Gastro-esophageal reflux disease without esophagitis: Secondary | ICD-10-CM | POA: Diagnosis not present

## 2020-08-04 DIAGNOSIS — D696 Thrombocytopenia, unspecified: Secondary | ICD-10-CM

## 2020-08-04 LAB — CBC WITH DIFFERENTIAL/PLATELET
Basophils Absolute: 0.1 10*3/uL (ref 0.0–0.1)
Basophils Relative: 1.2 % (ref 0.0–3.0)
Eosinophils Absolute: 0.1 10*3/uL (ref 0.0–0.7)
Eosinophils Relative: 1.6 % (ref 0.0–5.0)
HCT: 36.8 % (ref 36.0–46.0)
Hemoglobin: 13.1 g/dL (ref 12.0–15.0)
Lymphocytes Relative: 24.2 % (ref 12.0–46.0)
Lymphs Abs: 1.1 10*3/uL (ref 0.7–4.0)
MCHC: 35.7 g/dL (ref 30.0–36.0)
MCV: 93.2 fl (ref 78.0–100.0)
Monocytes Absolute: 0.4 10*3/uL (ref 0.1–1.0)
Monocytes Relative: 8 % (ref 3.0–12.0)
Neutro Abs: 3 10*3/uL (ref 1.4–7.7)
Neutrophils Relative %: 65 % (ref 43.0–77.0)
Platelets: 83 10*3/uL — ABNORMAL LOW (ref 150.0–400.0)
RBC: 3.95 Mil/uL (ref 3.87–5.11)
RDW: 12.7 % (ref 11.5–15.5)
WBC: 4.7 10*3/uL (ref 4.0–10.5)

## 2020-08-04 MED ORDER — PROPRANOLOL HCL 40 MG PO TABS: 40.0000 mg | ORAL_TABLET | Freq: Two times a day (BID) | ORAL | 1 refills | Status: DC

## 2020-08-04 MED ORDER — LOSARTAN POTASSIUM 25 MG PO TABS
25.0000 mg | ORAL_TABLET | Freq: Every day | ORAL | 1 refills | Status: DC
Start: 2020-08-04 — End: 2021-02-28

## 2020-08-04 MED ORDER — OMEPRAZOLE 20 MG PO CPDR: 20.0000 mg | DELAYED_RELEASE_CAPSULE | Freq: Every day | ORAL | 3 refills | Status: DC

## 2020-08-04 NOTE — Patient Instructions (Signed)
Cirrhosis  Cirrhosis is long-term (chronic) liver injury. The liver is the body's largest internal organ, and it performs many functions. It converts food into energy, removes toxic material from the blood, makes important proteins, and absorbs necessary vitamins from food. In cirrhosis, healthy liver cells are replaced by scar tissue. This prevents blood from flowing through the liver and makes it difficult for the liver to complete its functions. What are the causes? Common causes of this condition are hepatitis C and long-term alcohol abuse. Other causes include:  Nonalcoholic fatty liver disease (NAFLD). This happens when fat is deposited in the liver by causes other than alcohol.  Hepatitis B infection.  Autoimmune hepatitis. In this condition, the body's defense system (immune system) mistakenly attacks the liver cells, causing inflammation.  Diseases that cause blockage of ducts inside the liver.  Inherited liver diseases, such as hemochromatosis. This is one of the most common inherited liver diseases. In this disease, deposits of iron collect in the liver and other organs.  Reactions to certain long-term medicines, such as amiodarone, a heart medicine.  Parasitic infections. These include schistosomiasis, which is caused by a flatworm.  Long-term contact to certain toxins. These toxins include certain organic solvents, such as toluene and chloroform. What increases the risk? You are more likely to develop this condition if:  You have certain types of viral hepatitis.  You abuse alcohol, especially if you are female.  You are overweight.  You use IV drugs and share needles.  You have unprotected sex with someone who has viral hepatitis. What are the signs or symptoms? You may not have any signs and symptoms at first. Symptoms may not develop until the damage to your liver starts to get worse. Early symptoms may include:  Weakness and tiredness (fatigue).  Changes in  sleep patterns or having trouble sleeping.  Itchiness.  Tenderness in the right-upper part of your abdomen.  Weight loss and muscle loss.  Nausea.  Loss of appetite. Later symptoms may include:  Fatigue or weakness that is getting worse.  Yellow skin and eyes (jaundice).  Buildup of fluid in the abdomen (ascites). You may notice that your clothes are tight around your waist.  Weight gain and swelling of the feet and ankles (edema).  Trouble breathing.  Easy bruising and bleeding.  Vomiting blood, or black or bloody stool.  Mental confusion. How is this diagnosed? Your health care provider may suspect cirrhosis based on your symptoms and medical history, especially if you have other medical conditions or a history of alcohol abuse. Your health care provider will do a physical exam to feel your liver and to check for signs of cirrhosis. Tests may include:  Blood tests to check: ? For hepatitis B or C. ? Kidney function. ? Liver function.  Imaging tests such as: ? MRI or CT scan to look for changes seen in advanced cirrhosis. ? Ultrasound to see if normal liver tissue is being replaced by scar tissue.  A procedure in which a long needle is used to take a sample of liver tissue to be checked in a lab (biopsy). Liver biopsy can confirm the diagnosis of cirrhosis. How is this treated? Treatment for this condition depends on how damaged your liver is and what caused the damage. It may include treating the symptoms of cirrhosis, or treating the underlying causes to slow the damage. Treatment may include:  Making lifestyle changes, such as: ? Eating a healthy diet. You may need to work with your health care   provider or a dietitian to develop an eating plan. ? Restricting salt intake. ? Maintaining a healthy weight. ? Not abusing drugs or alcohol.  Taking medicines to: ? Treat liver infections or other infections. ? Control itching. ? Reduce fluid buildup. ? Reduce certain  blood toxins. ? Reduce risk of bleeding from enlarged blood vessels in the stomach or esophagus (varices).  Liver transplant. In this procedure, a liver from a donor is used to replace your diseased liver. This is done if cirrhosis has caused liver failure. Other treatments and procedures may be done depending on the problems that you get from cirrhosis. Common problems include liver-related kidney failure (hepatorenal syndrome). Follow these instructions at home:  Take medicines only as told by your health care provider. Do not use medicines that are toxic to your liver. Ask your health care provider before taking any new medicines, including over-the-counter medicines such as NSAIDs.  Rest as needed.  Eat a well-balanced diet.  Limit your salt or water intake, if your health care provider asks you to do this.  Do not drink alcohol. This is especially important if you routinely take acetaminophen.  Keep all follow-up visits. This is important.   Contact a health care provider if you:  Have fatigue or weakness that is getting worse.  Develop swelling of the hands, feet, or legs, or a buildup of fluid in the abdomen (ascites).  Have a fever or chills.  Develop loss of appetite.  Have nausea or vomiting.  Develop jaundice.  Develop easy bruising or bleeding. Get help right away if you:  Vomit bright red blood or a material that looks like coffee grounds.  Have blood in your stools.  Notice that your stools appear black and tarry.  Become confused.  Have chest pain or trouble breathing. These symptoms may represent a serious problem that is an emergency. Do not wait to see if the symptoms will go away. Get medical help right away. Call your local emergency services (911 in the [place] Do not drive yourself to the hospital. Summary  Cirrhosis is chronic liver injury. Common causes are hepatitis C and long-term alcohol abuse.  Tests used to diagnose cirrhosis include blood  tests, imaging tests, and liver biopsy.  Treatment for this condition involves treating the underlying cause. Avoid alcohol, drugs, salt, and medicines that may damage your liver.  Get help right away if you vomit bright red blood or a material that looks like coffee grounds. This information is not intended to replace advice given to you by your health care provider. Make sure you discuss any questions you have with your health care provider. Document Revised: 11/29/2019 Document Reviewed: 11/29/2019 Elsevier Patient Education  2021 Elsevier Inc.  

## 2020-08-04 NOTE — Assessment & Plan Note (Signed)
No alcohol intake for the last 9 years.

## 2020-08-04 NOTE — Telephone Encounter (Signed)
lvm to sch appt to obtain refills. Last seen June of last yr

## 2020-08-04 NOTE — Assessment & Plan Note (Signed)
No complications.  No signs of strangulation or incarceration.

## 2020-08-04 NOTE — Assessment & Plan Note (Signed)
Well-controlled hypertension.  Continue losartan 25 mg and Lasix 40 mg

## 2020-08-04 NOTE — Assessment & Plan Note (Signed)
Stable without complications.  No significant ascites or edema.  No signs of volume overload.

## 2020-08-04 NOTE — Assessment & Plan Note (Signed)
Normal sinus rhythm today.  Continue propranolol 40 mg twice a day.

## 2020-08-04 NOTE — Progress Notes (Signed)
Emily Butler 65 y.o.   Chief Complaint  Patient presents with  . New Patient (Initial Visit)    HISTORY OF PRESENT ILLNESS: This is a 65 y.o. female first visit to this office, here to establish care with me. Patient has the following chronic medical problems: 1.  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  Has not seen a GI doctor in several years. 2.  Hypertension: On losartan 25 mg daily Lasix 40 mg daily. 3.  History of atrial fibrillation: On propranolol 40 mg twice a day. 4.  History of chronic depression: Sees psychiatrist on a regular basis.  On Zoloft 50 mg daily  HPI   Prior to Admission medications   Medication Sig Start Date End Date Taking? Authorizing Provider  furosemide (LASIX) 40 MG tablet TAKE 1 TABLET BY MOUTH EVERY DAY 04/30/20  Yes Sowles, Danna HeftyKrichna, MD  loperamide (IMODIUM A-D) 2 MG tablet Take 1 tablet (2 mg total) by mouth 4 (four) times daily as needed for diarrhea or loose stools. 08/27/19  Yes Danelle Berryapia, Leisa, PA-C  losartan (COZAAR) 25 MG tablet TAKE 1 TABLET BY MOUTH EVERY DAY 08/04/20  Yes Danelle Berryapia, Leisa, PA-C  Multiple Vitamin (MULTI-VITAMIN) tablet Take by mouth.   Yes [provider]  omeprazole (PRILOSEC) 20 MG capsule TAKE 1 CAPSULE BY MOUTH EVERY DAY 08/04/20  Yes Danelle Berryapia, Leisa, PA-C  potassium chloride SA (KLOR-CON) 20 MEQ tablet Take 1 tablet (20 mEq total) by mouth daily. Take with lasix 08/17/19  Yes Danelle Berryapia, Leisa, PA-C  propranolol (INDERAL) 40 MG tablet TAKE 1 TABLET BY MOUTH TWICE A DAY 08/04/20  Yes Danelle Berryapia, Leisa, PA-C  sertraline (ZOLOFT) 50 MG tablet Take 1 tablet (50 mg total) by mouth daily. 07/15/20  Yes Jomarie LongsEappen, Saramma, MD    Allergies  Allergen Reactions  . Duloxetine Other (See Comments) and Nausea Only  . Codeine     REACTION: GI upset    Patient Active Problem List   Diagnosis Date Noted  . MDD (major depressive disorder), recurrent, in full remission (HCC) 09/26/2019  . Hyperlipidemia 08/21/2019  . MDD (major  depressive disorder), recurrent, in partial remission (HCC) 06/25/2019  . MDD (major depressive disorder), recurrent episode, mild (HCC) 12/11/2018  . Alcohol use disorder, moderate, in sustained remission (HCC) 12/11/2018  . Diarrhea 12/07/2018  . IFG (impaired fasting glucose) 12/07/2018  . Current severe episode of major depressive disorder without psychotic features (HCC) 12/07/2018  . Insomnia 12/07/2018  . Anemia due to chronic kidney disease 11/06/2014  . Chronic kidney disease, unspecified 08/13/2013  . Gastroesophageal reflux disease without esophagitis 08/13/2013  . Restless legs syndrome (RLS) 05/23/2013  . Umbilical hernia 10/31/2012  . Atrial fibrillation (HCC) 07/07/2012  . Hepatic cirrhosis (HCC) 07/07/2012  . Former smoker 07/07/2012  . Hypertension 07/07/2012  . Chronic hepatitis C (HCC) 07/07/2012  . THROMBOCYTOPENIA 12/20/2006    Past Medical History:  Diagnosis Date  . Alcoholic (HCC)   . Cirrhosis of liver (HCC)   . Depression   . EXCESSIVE MENSTRUATION 11/08/2006   Qualifier: Diagnosis of  By: Jillyn HiddenBean FNP, Mcarthur RossettiBillie-Lynn Daniels   . GERD (gastroesophageal reflux disease)   . Hepatitis C 2011  . Hypertension     Past Surgical History:  Procedure Laterality Date  . CESAREAN SECTION      Social History   Socioeconomic History  . Marital status: Divorced    Spouse name: Not on file  . Number of children: 4  . Years of education: Not on file  .  Highest education level: Not on file  Occupational History  . Occupation: disability  Tobacco Use  . Smoking status: Former Games developer  . Smokeless tobacco: Never Used  Vaping Use  . Vaping Use: Never used  Substance and Sexual Activity  . Alcohol use: Not Currently    Comment: Alcoholic quit 10 years ago  . Drug use: Never  . Sexual activity: Not Currently  Other Topics Concern  . Not on file  Social History Narrative  . Not on file   Social Determinants of Health   Financial Resource Strain: Low Risk   .  Difficulty of Paying Living Expenses: Not hard at all  Food Insecurity: No Food Insecurity  . Worried About Programme researcher, broadcasting/film/video in the Last Year: Never true  . Ran Out of Food in the Last Year: Never true  Transportation Needs: Unmet Transportation Needs  . Lack of Transportation (Medical): Yes  . Lack of Transportation (Non-Medical): Yes  Physical Activity: Inactive  . Days of Exercise per Week: 0 days  . Minutes of Exercise per Session: 0 min  Stress: No Stress Concern Present  . Feeling of Stress : Only a little  Social Connections: Socially Isolated  . Frequency of Communication with Friends and Family: More than three times a week  . Frequency of Social Gatherings with Friends and Family: More than three times a week  . Attends Religious Services: Never  . Active Member of Clubs or Organizations: No  . Attends Banker Meetings: Never  . Marital Status: Divorced  Catering manager Violence: Not At Risk  . Fear of Current or Ex-Partner: No  . Emotionally Abused: No  . Physically Abused: No  . Sexually Abused: No    Family History  Problem Relation Age of Onset  . Alcohol abuse Mother   . Drug abuse Mother   . Heart disease Father      Review of Systems  Constitutional: Negative.  Negative for chills and fever.  HENT: Negative.  Negative for congestion and sore throat.   Respiratory: Negative.  Negative for cough and shortness of breath.   Cardiovascular: Negative.  Negative for chest pain and palpitations.  Gastrointestinal: Negative.  Negative for abdominal pain, diarrhea, nausea and vomiting.  Genitourinary: Negative.  Negative for dysuria and hematuria.  Musculoskeletal: Negative.  Negative for joint pain and myalgias.  Skin: Negative.  Negative for rash.  Neurological: Negative.  Negative for dizziness and headaches.  All other systems reviewed and are negative.    Physical Exam Vitals reviewed.  Constitutional:      Appearance: Normal appearance.   HENT:     Head: Normocephalic.  Eyes:     Extraocular Movements: Extraocular movements intact.     Conjunctiva/sclera: Conjunctivae normal.     Pupils: Pupils are equal, round, and reactive to light.  Cardiovascular:     Rate and Rhythm: Normal rate and regular rhythm.     Pulses: Normal pulses.     Heart sounds: Normal heart sounds.  Pulmonary:     Effort: Pulmonary effort is normal.     Breath sounds: Normal breath sounds.  Abdominal:     General: There is no distension.     Palpations: Abdomen is soft.     Tenderness: There is no abdominal tenderness.     Hernia: A hernia (Umbilical) is present.  Musculoskeletal:        General: Normal range of motion.     Cervical back: Normal range of motion and neck  supple.     Right lower leg: No edema.     Left lower leg: No edema.  Skin:    General: Skin is warm and dry.     Capillary Refill: Capillary refill takes less than 2 seconds.  Neurological:     General: No focal deficit present.     Mental Status: She is alert and oriented to person, place, and time.  Psychiatric:        Mood and Affect: Mood normal.        Behavior: Behavior normal.      ASSESSMENT & PLAN: A total of 45 minutes was spent with the patient and counseling/coordination of care regarding establishing care with me, review of past medical history and multiple chronic medical conditions under management, review of all medications, review of most recent psychiatrist's office visit note, need for GI evaluation of liver cirrhosis and progression, education on nutrition, prognosis, documentation and need for follow-up.  Hypertension Well-controlled hypertension.  Continue losartan 25 mg and Lasix 40 mg  Atrial fibrillation (HCC) Normal sinus rhythm today.  Continue propranolol 40 mg twice a day.  Hepatic cirrhosis (HCC) Stable without complications.  No significant ascites or edema.  No signs of volume overload.  Umbilical hernia No complications.  No signs  of strangulation or incarceration.  MDD (major depressive disorder), recurrent, in partial remission (HCC) Stable.  Sees psychiatrist on a regular basis.  On Zoloft 50 mg daily.  Alcohol use disorder, moderate, in sustained remission (HCC) No alcohol intake for the last 9 years.  Faelynn was seen today for new patient (initial visit).  Diagnoses and all orders for this visit:  Cirrhosis of liver without ascites, unspecified hepatic cirrhosis type (HCC) -     HCV Ab w Reflex to Quant PCR -     CBC with Differential/Platelet -     Comprehensive metabolic panel -     Ambulatory referral to Gastroenterology  Essential hypertension -     losartan (COZAAR) 25 MG tablet; Take 1 tablet (25 mg total) by mouth daily. -     propranolol (INDERAL) 40 MG tablet; Take 1 tablet (40 mg total) by mouth 2 (two) times daily.  Medication refill -     losartan (COZAAR) 25 MG tablet; Take 1 tablet (25 mg total) by mouth daily. -     omeprazole (PRILOSEC) 20 MG capsule; Take 1 capsule (20 mg total) by mouth daily. -     propranolol (INDERAL) 40 MG tablet; Take 1 tablet (40 mg total) by mouth 2 (two) times daily.  Gastroesophageal reflux disease without esophagitis -     omeprazole (PRILOSEC) 20 MG capsule; Take 1 capsule (20 mg total) by mouth daily.  Atrial fibrillation, unspecified type (HCC) -     propranolol (INDERAL) 40 MG tablet; Take 1 tablet (40 mg total) by mouth 2 (two) times daily.  Encounter to establish care  Need for prophylactic vaccination against Streptococcus pneumoniae (pneumococcus) -     Pneumococcal polysaccharide vaccine 23-valent greater than or equal to 2yo subcutaneous/IM  Chronic depression  Alcohol use disorder, moderate, in sustained remission (HCC)  MDD (major depressive disorder), recurrent, in partial remission (HCC)  Thrombocytopenia, unspecified (HCC)  Umbilical hernia without obstruction and without gangrene    Patient Instructions   Cirrhosis  Cirrhosis  is long-term (chronic) liver injury. The liver is the body's largest internal organ, and it performs many functions. It converts food into energy, removes toxic material from the blood, makes important proteins, and absorbs  necessary vitamins from food. In cirrhosis, healthy liver cells are replaced by scar tissue. This prevents blood from flowing through the liver and makes it difficult for the liver to complete its functions. What are the causes? Common causes of this condition are hepatitis C and long-term alcohol abuse. Other causes include:  Nonalcoholic fatty liver disease (NAFLD). This happens when fat is deposited in the liver by causes other than alcohol.  Hepatitis B infection.  Autoimmune hepatitis. In this condition, the body's defense system (immune system) mistakenly attacks the liver cells, causing inflammation.  Diseases that cause blockage of ducts inside the liver.  Inherited liver diseases, such as hemochromatosis. This is one of the most common inherited liver diseases. In this disease, deposits of iron collect in the liver and other organs.  Reactions to certain long-term medicines, such as amiodarone, a heart medicine.  Parasitic infections. These include schistosomiasis, which is caused by a flatworm.  Long-term contact to certain toxins. These toxins include certain organic solvents, such as toluene and chloroform. What increases the risk? You are more likely to develop this condition if:  You have certain types of viral hepatitis.  You abuse alcohol, especially if you are female.  You are overweight.  You use IV drugs and share needles.  You have unprotected sex with someone who has viral hepatitis. What are the signs or symptoms? You may not have any signs and symptoms at first. Symptoms may not develop until the damage to your liver starts to get worse. Early symptoms may include:  Weakness and tiredness (fatigue).  Changes in sleep patterns or having  trouble sleeping.  Itchiness.  Tenderness in the right-upper part of your abdomen.  Weight loss and muscle loss.  Nausea.  Loss of appetite. Later symptoms may include:  Fatigue or weakness that is getting worse.  Yellow skin and eyes (jaundice).  Buildup of fluid in the abdomen (ascites). You may notice that your clothes are tight around your waist.  Weight gain and swelling of the feet and ankles (edema).  Trouble breathing.  Easy bruising and bleeding.  Vomiting blood, or black or bloody stool.  Mental confusion. How is this diagnosed? Your health care provider may suspect cirrhosis based on your symptoms and medical history, especially if you have other medical conditions or a history of alcohol abuse. Your health care provider will do a physical exam to feel your liver and to check for signs of cirrhosis. Tests may include:  Blood tests to check: ? For hepatitis B or C. ? Kidney function. ? Liver function.  Imaging tests such as: ? MRI or CT scan to look for changes seen in advanced cirrhosis. ? Ultrasound to see if normal liver tissue is being replaced by scar tissue.  A procedure in which a long needle is used to take a sample of liver tissue to be checked in a lab (biopsy). Liver biopsy can confirm the diagnosis of cirrhosis. How is this treated? Treatment for this condition depends on how damaged your liver is and what caused the damage. It may include treating the symptoms of cirrhosis, or treating the underlying causes to slow the damage. Treatment may include:  Making lifestyle changes, such as: ? Eating a healthy diet. You may need to work with your health care provider or a dietitian to develop an eating plan. ? Restricting salt intake. ? Maintaining a healthy weight. ? Not abusing drugs or alcohol.  Taking medicines to: ? Treat liver infections or other infections. ? Control  itching. ? Reduce fluid buildup. ? Reduce certain blood toxins. ? Reduce  risk of bleeding from enlarged blood vessels in the stomach or esophagus (varices).  Liver transplant. In this procedure, a liver from a donor is used to replace your diseased liver. This is done if cirrhosis has caused liver failure. Other treatments and procedures may be done depending on the problems that you get from cirrhosis. Common problems include liver-related kidney failure (hepatorenal syndrome). Follow these instructions at home:  Take medicines only as told by your health care provider. Do not use medicines that are toxic to your liver. Ask your health care provider before taking any new medicines, including over-the-counter medicines such as NSAIDs.  Rest as needed.  Eat a well-balanced diet.  Limit your salt or water intake, if your health care provider asks you to do this.  Do not drink alcohol. This is especially important if you routinely take acetaminophen.  Keep all follow-up visits. This is important.   Contact a health care provider if you:  Have fatigue or weakness that is getting worse.  Develop swelling of the hands, feet, or legs, or a buildup of fluid in the abdomen (ascites).  Have a fever or chills.  Develop loss of appetite.  Have nausea or vomiting.  Develop jaundice.  Develop easy bruising or bleeding. Get help right away if you:  Vomit bright red blood or a material that looks like coffee grounds.  Have blood in your stools.  Notice that your stools appear black and tarry.  Become confused.  Have chest pain or trouble breathing. These symptoms may represent a serious problem that is an emergency. Do not wait to see if the symptoms will go away. Get medical help right away. Call your local emergency services (911 in the [place] Do not drive yourself to the hospital. Summary  Cirrhosis is chronic liver injury. Common causes are hepatitis C and long-term alcohol abuse.  Tests used to diagnose cirrhosis include blood tests, imaging tests,  and liver biopsy.  Treatment for this condition involves treating the underlying cause. Avoid alcohol, drugs, salt, and medicines that may damage your liver.  Get help right away if you vomit bright red blood or a material that looks like coffee grounds. This information is not intended to replace advice given to you by your health care provider. Make sure you discuss any questions you have with your health care provider. Document Revised: 11/29/2019 Document Reviewed: 11/29/2019 Elsevier Patient Education  2021 Elsevier Inc.     Edwina Barth, MD [facility]

## 2020-08-04 NOTE — Assessment & Plan Note (Signed)
Stable.  Sees psychiatrist on a regular basis.  On Zoloft 50 mg daily.

## 2020-08-05 ENCOUNTER — Encounter: Payer: Self-pay | Admitting: Emergency Medicine

## 2020-08-05 LAB — COMPREHENSIVE METABOLIC PANEL
ALT: 12 U/L (ref 0–35)
AST: 21 U/L (ref 0–37)
Albumin: 4.6 g/dL (ref 3.5–5.2)
Alkaline Phosphatase: 83 U/L (ref 39–117)
BUN: 26 mg/dL — ABNORMAL HIGH (ref 6–23)
CO2: 24 mEq/L (ref 19–32)
Calcium: 9.7 mg/dL (ref 8.4–10.5)
Chloride: 107 mEq/L (ref 96–112)
Creatinine, Ser: 0.76 mg/dL (ref 0.40–1.20)
GFR: 83.47 mL/min (ref >60.00)
Glucose, Bld: 109 mg/dL — ABNORMAL HIGH (ref 70–99)
Potassium: 4.1 mEq/L (ref 3.5–5.1)
Sodium: 139 mEq/L (ref 135–145)
Total Bilirubin: 0.7 mg/dL (ref 0.2–1.2)
Total Protein: 6.5 g/dL (ref 6.0–8.3)

## 2020-08-06 LAB — HCV RT-PCR, QUANT (NON-GRAPH)

## 2020-08-06 LAB — HCV AB W REFLEX TO QUANT PCR: HCV Ab: >11 s/co ratio — ABNORMAL HIGH (ref 0.0–0.9)

## 2020-08-22 ENCOUNTER — Other Ambulatory Visit: Payer: Self-pay

## 2020-09-04 ENCOUNTER — Other Ambulatory Visit: Payer: Self-pay | Admitting: Family Medicine

## 2020-09-04 DIAGNOSIS — Z760 Encounter for issue of repeat prescription: Secondary | ICD-10-CM

## 2020-09-10 ENCOUNTER — Ambulatory Visit: Payer: Medicare Other | Admitting: Psychiatry

## 2020-09-25 ENCOUNTER — Telehealth (INDEPENDENT_AMBULATORY_CARE_PROVIDER_SITE_OTHER): Payer: Medicare Other | Admitting: Psychiatry

## 2020-09-25 ENCOUNTER — Other Ambulatory Visit: Payer: Self-pay

## 2020-09-25 ENCOUNTER — Encounter: Payer: Self-pay | Admitting: Psychiatry

## 2020-09-25 DIAGNOSIS — F3342 Major depressive disorder, recurrent, in full remission: Secondary | ICD-10-CM | POA: Diagnosis not present

## 2020-09-25 DIAGNOSIS — F1021 Alcohol dependence, in remission: Secondary | ICD-10-CM

## 2020-09-25 NOTE — Progress Notes (Signed)
Virtual Visit via Video Note  I connected with Emily Butler on 09/25/20 at  3:30 PM EDT by a video enabled telemedicine application and verified that I am speaking with the correct person using two identifiers.  Location Provider Location : [facility] Patient Location : Home  Participants: Patient , Provider   I discussed the limitations of evaluation and management by telemedicine and the availability of in person appointments. The patient expressed understanding and agreed to proceed.    I discussed the assessment and treatment plan with the patient. The patient was provided an opportunity to ask questions and all were answered. The patient agreed with the plan and demonstrated an understanding of the instructions.   The patient was advised to call back or seek an in-person evaluation if the symptoms worsen or if the condition fails to improve as anticipated.   [facility] MD OP Progress Note  09/25/2020 3:42 PM Emily Butler  MRN:  [id number]  Chief Complaint:  Chief Complaint   Follow-up    HPI: Emily Butler is a 65 year old female, retired, disabled, lives in [place], has a history of MDD, alcohol use disorder in remission, history of cirrhosis of liver without ascites, essential hypertension, gastroesophageal reflux disease without esophagitis, atrial fibrillation was evaluated by telemedicine today.  Patient today reports she went through an episode of feeling sad and down mostly because of her situational and financial stressors.  She reports she has several bills to pay off right now and she is having trouble with that.  She however has been managing okay and currently feels better.  She reports she is compliant on the sertraline.  She wants to stay on this dosage for now.  Denies any side effects.  Reports sleep is overall okay.  She has a different sleep schedule and goes to bed at around 4 AM and stays in bed till noon.  She reports she tried in the past to change her sleep  schedule however that did not work and hence she went back to sleeping early morning and staying up all night.  She reports she spends her time painting and that is a good distraction for her.  She enjoys painting and has several paintings that she has done at home which she is very proud of.  She also reports she recently was able to sell a few paintings and that also brought a lot of joy.  Patient continues to stay away from alcohol.  She denies any suicidality, homicidality or perceptual disturbances.  She enjoys spending time with her pet dogs which keep her busy.  Patient denies any other concerns today.  Visit Diagnosis:    ICD-10-CM   1. MDD (major depressive disorder), recurrent, in full remission (HCC)  F33.42     2. Alcohol use disorder, moderate, in sustained remission (HCC)  F10.21       Past Psychiatric History: I have reviewed past psychiatric history from progress note on 12/11/2018  Past Medical History:  Past Medical History:  Diagnosis Date   Alcoholic (HCC)    Cirrhosis of liver (HCC)    Depression    EXCESSIVE MENSTRUATION 11/08/2006   Qualifier: Diagnosis of  By: Jillyn Hidden FNP, Mcarthur Rossetti    GERD (gastroesophageal reflux disease)    Hepatitis C 2011   Hypertension     Past Surgical History:  Procedure Laterality Date   CESAREAN SECTION      Family Psychiatric History: Reviewed family psychiatric history from progress note on 12/11/2018  Family History:  Family History  Problem Relation Age of Onset   Alcohol abuse Mother    Drug abuse Mother    Heart disease Father     Social History: Reviewed social history from progress note on 12/11/2018 Social History   Socioeconomic History   Marital status: Divorced    Spouse name: Not on file   Number of children: 4   Years of education: Not on file   Highest education level: Not on file  Occupational History   Occupation: disability  Tobacco Use   Smoking status: Former   Smokeless tobacco:  Never  Building services engineer Use: Never used  Substance and Sexual Activity   Alcohol use: Not Currently    Comment: Alcoholic quit 10 years ago   Drug use: Never   Sexual activity: Not Currently  Other Topics Concern   Not on file  Social History Narrative   Not on file   Social Determinants of Health   Financial Resource Strain: Low Risk    Difficulty of Paying Living Expenses: Not hard at all  Food Insecurity: No Food Insecurity   Worried About Programme researcher, broadcasting/film/video in the Last Year: Never true   Barista in the Last Year: Never true  Transportation Needs: Unmet Transportation Needs   Lack of Transportation (Medical): Yes   Lack of Transportation (Non-Medical): Yes  Physical Activity: Inactive   Days of Exercise per Week: 0 days   Minutes of Exercise per Session: 0 min  Stress: No Stress Concern Present   Feeling of Stress : Only a little  Social Connections: Socially Isolated   Frequency of Communication with Friends and Family: More than three times a week   Frequency of Social Gatherings with Friends and Family: More than three times a week   Attends Religious Services: Never   Database administrator or Organizations: No   Attends Banker Meetings: Never   Marital Status: Divorced    Allergies:  Allergies  Allergen Reactions   Duloxetine Other (See Comments) and Nausea Only   Codeine     REACTION: GI upset    Metabolic Disorder Labs: Lab Results  Component Value Date   HGBA1C 5.3 08/17/2018   MPG 105 08/17/2018   No results found for: PROLACTIN Lab Results  Component Value Date   CHOL 228 (H) 08/17/2019   TRIG 121 08/17/2019   HDL 59 08/17/2019   CHOLHDL 3.9 08/17/2019   VLDL 19 02/04/2012   LDLCALC 145 (H) 08/17/2019   LDLCALC 107 (H) 08/17/2018   No results found for: TSH  Therapeutic Level Labs: No results found for: LITHIUM No results found for: VALPROATE No components found for:  CBMZ  Current Medications: Current  Outpatient Medications  Medication Sig Dispense Refill   furosemide (LASIX) 40 MG tablet TAKE 1 TABLET BY MOUTH EVERY DAY 90 tablet 0   loperamide (IMODIUM A-D) 2 MG tablet Take 1 tablet (2 mg total) by mouth 4 (four) times daily as needed for diarrhea or loose stools. 30 tablet 1   losartan (COZAAR) 25 MG tablet Take 1 tablet (25 mg total) by mouth daily. 90 tablet 1   Multiple Vitamin (MULTI-VITAMIN) tablet Take by mouth.     omeprazole (PRILOSEC) 20 MG capsule Take 1 capsule (20 mg total) by mouth daily. 30 capsule 3   potassium chloride SA (KLOR-CON) 20 MEQ tablet Take 1 tablet (20 mEq total) by mouth daily. Take with lasix 90 tablet 3   propranolol (  INDERAL) 40 MG tablet Take 1 tablet (40 mg total) by mouth 2 (two) times daily. 180 tablet 1   sertraline (ZOLOFT) 50 MG tablet Take 1 tablet (50 mg total) by mouth daily. 90 tablet 1   No current facility-administered medications for this visit.     Musculoskeletal: Strength & Muscle Tone:  UTA Gait & Station: normal Patient leans: N/A  Psychiatric Specialty Exam: Review of Systems  Psychiatric/Behavioral:  The patient is nervous/anxious.   All other systems reviewed and are negative.  There were no vitals taken for this visit.There is no height or weight on file to calculate BMI.  General Appearance: Casual  Eye Contact:  Good  Speech:  Clear and Coherent  Volume:  Normal  Mood:  Anxious  Affect:  Congruent  Thought Process:  Goal Directed and Descriptions of Associations: Intact  Orientation:  Full (Time, Place, and Person)  Thought Content: Logical   Suicidal Thoughts:  No  Homicidal Thoughts:  No  Memory:  Immediate;   Fair Recent;   Fair Remote;   Fair  Judgement:  Fair  Insight:  Fair  Psychomotor Activity:  Normal  Concentration:  Concentration: Fair and Attention Span: Fair  Recall:  [employer] of Knowledge: Fair  Language: Fair  Akathisia:  No  Handed:  Right  AIMS (if indicated): not done  Assets:   Communication Skills Desire for Improvement Social Support Transportation  ADL's:  Intact  Cognition: WNL  Sleep:  Fair   Screenings: GAD-7    Flowsheet Row Office Visit from 08/11/2018 in [facility]  Total GAD-7 Score 4      PHQ2-9    Flowsheet Row Video Visit from 09/25/2020 in [facility] Office Visit from 08/04/2020 in [facility] Video Visit from 07/15/2020 in [facility] Video Visit from 04/21/2020 in [facility] Clinical Support from 01/31/2020 in [facility]  PHQ-2 Total Score 0 0 0 0 0  PHQ-9 Total Score 4 -- 0 -- --      Flowsheet Row Video Visit from 09/25/2020 in [facility]  C-SSRS RISK CATEGORY Low Risk        Assessment and Plan: Emily Butler is a 65 year old female, lives in [place], has a history of depression, alcohol use disorder in remission, atrial fibrillation, hepatitis C, was evaluated by telemedicine today.  Patient is currently stable.  Plan as noted below.  Plan MDD in full remission Zoloft 50 mg p.o. daily  Alcohol use disorder in remission Patient has been sober since the past 13 years.  Follow-up in clinic in office in 3 months.  This note was generated in part or whole with voice recognition software. Voice recognition is usually quite accurate but there are transcription errors that can and very often do occur. I apologize for any typographical errors that were not detected and corrected.       Jomarie Longs, MD 09/26/2020, 5:59 PM

## 2020-12-02 ENCOUNTER — Other Ambulatory Visit: Payer: Self-pay | Admitting: Family Medicine

## 2020-12-02 DIAGNOSIS — Z760 Encounter for issue of repeat prescription: Secondary | ICD-10-CM

## 2020-12-02 DIAGNOSIS — I10 Essential (primary) hypertension: Secondary | ICD-10-CM

## 2020-12-02 DIAGNOSIS — I4891 Unspecified atrial fibrillation: Secondary | ICD-10-CM

## 2020-12-17 ENCOUNTER — Telehealth: Payer: Self-pay | Admitting: Emergency Medicine

## 2020-12-17 DIAGNOSIS — Z760 Encounter for issue of repeat prescription: Secondary | ICD-10-CM

## 2020-12-17 DIAGNOSIS — K219 Gastro-esophageal reflux disease without esophagitis: Secondary | ICD-10-CM

## 2020-12-17 NOTE — Telephone Encounter (Signed)
1.Medication Requested: omeprazole (PRILOSEC) 20 MG capsule (Expired)  2. Pharmacy (Name, Street, [place]): [facility] [phone] - [place], [place] - [address]  3. On Med List: yes  4. Last Visit with PCP: 08-04-2020   5. Next visit date with PCP: 02-03-2021  Patient requesting refill advised patient rx was expired   Agent: Please be advised that RX refills may take up to 3 business days. We ask that you follow-up with your pharmacy.

## 2020-12-18 MED ORDER — OMEPRAZOLE 20 MG PO CPDR: 20.0000 mg | DELAYED_RELEASE_CAPSULE | Freq: Every day | ORAL | 3 refills | Status: DC

## 2020-12-18 NOTE — Telephone Encounter (Signed)
Refilled medication

## 2020-12-31 ENCOUNTER — Other Ambulatory Visit: Payer: Self-pay | Admitting: Family Medicine

## 2020-12-31 DIAGNOSIS — Z760 Encounter for issue of repeat prescription: Secondary | ICD-10-CM

## 2020-12-31 DIAGNOSIS — I4891 Unspecified atrial fibrillation: Secondary | ICD-10-CM

## 2020-12-31 DIAGNOSIS — I10 Essential (primary) hypertension: Secondary | ICD-10-CM

## 2021-01-13 ENCOUNTER — Encounter: Payer: Self-pay | Admitting: Psychiatry

## 2021-01-13 ENCOUNTER — Other Ambulatory Visit: Payer: Self-pay

## 2021-01-13 ENCOUNTER — Telehealth (INDEPENDENT_AMBULATORY_CARE_PROVIDER_SITE_OTHER): Payer: Medicare Other | Admitting: Psychiatry

## 2021-01-13 DIAGNOSIS — F1021 Alcohol dependence, in remission: Secondary | ICD-10-CM | POA: Diagnosis not present

## 2021-01-13 DIAGNOSIS — F3342 Major depressive disorder, recurrent, in full remission: Secondary | ICD-10-CM

## 2021-01-13 MED ORDER — SERTRALINE HCL 50 MG PO TABS: 50.0000 mg | ORAL_TABLET | Freq: Every day | ORAL | 0 refills | Status: DC

## 2021-01-13 NOTE — Progress Notes (Signed)
Virtual Visit via Video Note  I connected with Emily Butler on 01/13/21 at  2:00 PM EST by a video enabled telemedicine application and verified that I am speaking with the correct person using two identifiers.  Location Provider Location : [facility] Patient Location : Home  Participants: Patient , Provider   I discussed the limitations of evaluation and management by telemedicine and the availability of in person appointments. The patient expressed understanding and agreed to proceed. I discussed the assessment and treatment plan with the patient. The patient was provided an opportunity to ask questions and all were answered. The patient agreed with the plan and demonstrated an understanding of the instructions.   The patient was advised to call back or seek an in-person evaluation if the symptoms worsen or if the condition fails to improve as anticipated.  Video connection was lost at less than 50% of the duration of the visit, at which time the remainder of the visit was completed through audio only   [facility] MD OP Progress Note  01/13/2021 2:28 PM Emily Butler  MRN:  [id number]  Chief Complaint:  Chief Complaint   Follow-up; Depression    HPI: Emily Butler is a 65 year old female, retired, disabled, lives in [place], has a history of MDD, alcohol use disorder in remission, history of cirrhosis of liver without ascites, essential hypertension, gastroesophageal reflux disease without esophagitis, atrial fibrillation was evaluated by telemedicine today.  Patient today reports overall she is doing well with regards to her mood.  She does have a lot of situational stressors, interpersonal relationship problems at home however so far she has been handling okay.  She reports she is able to take a step back and separate herself from the situation and that helps.  She continues to be compliant on the sertraline.  Denies side effects.  She reports sleep is overall okay.  She does have  difficulty falling asleep however she sleeps through the night and reports she feels rested when she wakes up.  She continues to stay away from alcohol.  Denies suicidality, homicidality or perceptual disturbances.  Patient denies any other concerns today.  Visit Diagnosis:    ICD-10-CM   1. MDD (major depressive disorder), recurrent, in full remission (HCC)  F33.42 sertraline (ZOLOFT) 50 MG tablet    TSH    2. Alcohol use disorder, moderate, in sustained remission (HCC)  F10.21       Past Psychiatric History: Reviewed past psychiatric history from progress note on 12/11/2018.  Past Medical History:  Past Medical History:  Diagnosis Date   Alcoholic (HCC)    Cirrhosis of liver (HCC)    Depression    EXCESSIVE MENSTRUATION 11/08/2006   Qualifier: Diagnosis of  By: Jillyn Hidden FNP, Mcarthur Rossetti    GERD (gastroesophageal reflux disease)    Hepatitis C 2011   Hypertension     Past Surgical History:  Procedure Laterality Date   CESAREAN SECTION      Family Psychiatric History: Reviewed family psychiatric history from progress note on 12/11/2018.  Family History:  Family History  Problem Relation Age of Onset   Alcohol abuse Mother    Drug abuse Mother    Heart disease Father     Social History: Reviewed social history from progress note on 12/11/2018. Social History   Socioeconomic History   Marital status: Divorced    Spouse name: Not on file   Number of children: 4   Years of education: Not on file   Highest education  level: Not on file  Occupational History   Occupation: disability  Tobacco Use   Smoking status: Former   Smokeless tobacco: Never  Building services engineer Use: Never used  Substance and Sexual Activity   Alcohol use: Not Currently    Comment: Alcoholic quit 10 years ago   Drug use: Never   Sexual activity: Not Currently  Other Topics Concern   Not on file  Social History Narrative   Not on file   Social Determinants of Health   Financial  Resource Strain: Low Risk    Difficulty of Paying Living Expenses: Not hard at all  Food Insecurity: No Food Insecurity   Worried About Programme researcher, broadcasting/film/video in the Last Year: Never true   Barista in the Last Year: Never true  Transportation Needs: Unmet Transportation Needs   Lack of Transportation (Medical): Yes   Lack of Transportation (Non-Medical): Yes  Physical Activity: Inactive   Days of Exercise per Week: 0 days   Minutes of Exercise per Session: 0 min  Stress: No Stress Concern Present   Feeling of Stress : Only a little  Social Connections: Socially Isolated   Frequency of Communication with Friends and Family: More than three times a week   Frequency of Social Gatherings with Friends and Family: More than three times a week   Attends Religious Services: Never   Database administrator or Organizations: No   Attends Banker Meetings: Never   Marital Status: Divorced    Allergies:  Allergies  Allergen Reactions   Duloxetine Other (See Comments) and Nausea Only   Codeine     REACTION: GI upset    Metabolic Disorder Labs: Lab Results  Component Value Date   HGBA1C 5.3 08/17/2018   MPG 105 08/17/2018   No results found for: PROLACTIN Lab Results  Component Value Date   CHOL 228 (H) 08/17/2019   TRIG 121 08/17/2019   HDL 59 08/17/2019   CHOLHDL 3.9 08/17/2019   VLDL 19 02/04/2012   LDLCALC 145 (H) 08/17/2019   LDLCALC 107 (H) 08/17/2018   No results found for: TSH  Therapeutic Level Labs: No results found for: LITHIUM No results found for: VALPROATE No components found for:  CBMZ  Current Medications: Current Outpatient Medications  Medication Sig Dispense Refill   loperamide (IMODIUM A-D) 2 MG tablet Take 1 tablet (2 mg total) by mouth 4 (four) times daily as needed for diarrhea or loose stools. 30 tablet 1   losartan (COZAAR) 25 MG tablet Take 1 tablet (25 mg total) by mouth daily. 90 tablet 1   omeprazole (PRILOSEC) 20 MG capsule  Take 1 capsule (20 mg total) by mouth daily. 30 capsule 3   propranolol (INDERAL) 40 MG tablet Take 1 tablet (40 mg total) by mouth 2 (two) times daily. 180 tablet 1   furosemide (LASIX) 40 MG tablet TAKE 1 TABLET BY MOUTH EVERY DAY (Patient not taking: Reported on 01/13/2021) 90 tablet 0   Multiple Vitamin (MULTI-VITAMIN) tablet Take by mouth. (Patient not taking: Reported on 01/13/2021)     potassium chloride SA (KLOR-CON) 20 MEQ tablet Take 1 tablet (20 mEq total) by mouth daily. Take with lasix (Patient not taking: Reported on 01/13/2021) 90 tablet 3   sertraline (ZOLOFT) 50 MG tablet Take 1 tablet (50 mg total) by mouth daily. 90 tablet 0   No current facility-administered medications for this visit.     Musculoskeletal: Strength & Muscle Tone:  UTA  Gait & Station:  Seated Patient leans: N/A  Psychiatric Specialty Exam: Review of Systems  Psychiatric/Behavioral:  Negative for agitation, behavioral problems, confusion, decreased concentration, dysphoric mood, hallucinations, self-injury, sleep disturbance and suicidal ideas. The patient is not nervous/anxious and is not hyperactive.   All other systems reviewed and are negative.  There were no vitals taken for this visit.There is no height or weight on file to calculate BMI.  General Appearance: Casual  Eye Contact:  Good  Speech:  Clear and Coherent  Volume:  Normal  Mood:  Euthymic  Affect:  Congruent  Thought Process:  Goal Directed and Descriptions of Associations: Intact  Orientation:  Full (Time, Place, and Person)  Thought Content: Logical   Suicidal Thoughts:  No  Homicidal Thoughts:  No  Memory:  Immediate;   Fair Recent;   Fair Remote;   Fair  Judgement:  Fair  Insight:  Fair  Psychomotor Activity:  Normal  Concentration:  Concentration: Fair and Attention Span: Fair  Recall:  [employer] of Knowledge: Fair  Language: Fair  Akathisia:  No  Handed:  Right  AIMS (if indicated): done, 0  Assets:  Communication  Skills Desire for Improvement Housing Social Support  ADL's:  Intact  Cognition: WNL  Sleep:  Fair   Screenings: GAD-7    Flowsheet Row Video Visit from 01/13/2021 in [facility] Office Visit from 08/11/2018 in [facility]  Total GAD-7 Score 2 4      PHQ2-9    Flowsheet Row Video Visit from 01/13/2021 in [facility] Video Visit from 09/25/2020 in [facility] Office Visit from 08/04/2020 in [facility] Video Visit from 07/15/2020 in [facility] Video Visit from 04/21/2020 in [facility]  PHQ-2 Total Score 0 0 0 0 0  PHQ-9 Total Score 1 4 -- 0 --      Flowsheet Row Video Visit from 09/25/2020 in [facility]  C-SSRS RISK CATEGORY Low Risk        Assessment and Plan: KHUSHBOO CHUCK is a 65 year old female, lives in [place], has a history of depression, alcohol use disorder in remission, atrial fibrillation, hepatitis C was evaluated by telemedicine today.  Patient is currently stable.  Plan as noted below.  Plan MDD in full remission Zoloft 50 mg p.o. daily  Alcohol use disorder in remission She has been sober since the past 13 years.  Discussed getting labs done, will order TSH.  Patient to go to [facility] or talk to her primary care provider.  Follow-up in clinic in 3 months or sooner in person.  Patient agrees to come into the office for next visit.    I have spent at least 20 minutes non face to face with patient today .  This note was generated in part or whole with voice recognition software. Voice recognition is usually quite accurate but there are transcription errors that can and very often do occur. I apologize for any typographical errors that were not detected and corrected.       Jomarie Longs, MD 01/14/2021, 8:18 AM

## 2021-02-03 ENCOUNTER — Ambulatory Visit (INDEPENDENT_AMBULATORY_CARE_PROVIDER_SITE_OTHER): Payer: Medicare Other | Admitting: Emergency Medicine

## 2021-02-03 ENCOUNTER — Other Ambulatory Visit: Payer: Self-pay

## 2021-02-03 ENCOUNTER — Encounter: Payer: Self-pay | Admitting: Emergency Medicine

## 2021-02-03 VITALS — BP 122/78 | HR 73 | Ht 63.0 in | Wt 189.0 lb

## 2021-02-03 DIAGNOSIS — Z23 Encounter for immunization: Secondary | ICD-10-CM | POA: Diagnosis not present

## 2021-02-03 DIAGNOSIS — Z760 Encounter for issue of repeat prescription: Secondary | ICD-10-CM

## 2021-02-03 DIAGNOSIS — I10 Essential (primary) hypertension: Secondary | ICD-10-CM | POA: Diagnosis not present

## 2021-02-03 DIAGNOSIS — I4891 Unspecified atrial fibrillation: Secondary | ICD-10-CM | POA: Diagnosis not present

## 2021-02-03 DIAGNOSIS — M2550 Pain in unspecified joint: Secondary | ICD-10-CM | POA: Insufficient documentation

## 2021-02-03 DIAGNOSIS — K7460 Unspecified cirrhosis of liver: Secondary | ICD-10-CM | POA: Diagnosis not present

## 2021-02-03 DIAGNOSIS — F3342 Major depressive disorder, recurrent, in full remission: Secondary | ICD-10-CM

## 2021-02-03 DIAGNOSIS — R197 Diarrhea, unspecified: Secondary | ICD-10-CM

## 2021-02-03 MED ORDER — LOPERAMIDE HCL 2 MG PO TABS: 2.0000 mg | ORAL_TABLET | Freq: Four times a day (QID) | ORAL | 1 refills | Status: DC | PRN

## 2021-02-03 MED ORDER — POTASSIUM CHLORIDE CRYS ER 20 MEQ PO TBCR
20.0000 meq | EXTENDED_RELEASE_TABLET | Freq: Every day | ORAL | 3 refills | Status: DC
Start: 2021-02-03 — End: 2023-01-06

## 2021-02-03 MED ORDER — DICLOFENAC SODIUM 75 MG PO TBEC: 75.0000 mg | DELAYED_RELEASE_TABLET | Freq: Two times a day (BID) | ORAL | 1 refills | Status: DC

## 2021-02-03 MED ORDER — FUROSEMIDE 40 MG PO TABS: 40.0000 mg | ORAL_TABLET | Freq: Every day | ORAL | 3 refills | Status: DC

## 2021-02-03 NOTE — Assessment & Plan Note (Signed)
Well-controlled on Lasix and propranolol. BP Readings from Last 3 Encounters:  02/03/21 122/78  08/04/20 116/78  08/17/19 140/82

## 2021-02-03 NOTE — Assessment & Plan Note (Signed)
Normal renal function.  Able to take ibuprofen.  Meloxicam has given her upper GI problems in the past.  Still wants to try diclofenac as needed.

## 2021-02-03 NOTE — Assessment & Plan Note (Signed)
Stable.  Continue propranolol 40 mg twice a day.

## 2021-02-03 NOTE — Progress Notes (Signed)
Emily Butler 65 y.o.   Chief Complaint  Patient presents with   Follow-up    6 month f/u. Med refill of the lasix,loperamide, and potassium. Pt would like medication for joint pain.    HISTORY OF PRESENT ILLNESS: This is a 65 y.o. female here for 44-month follow-up on chronic medical problems. Has history of liver cirrhosis.  GI referral was placed on 08/04/2020 but has not been scheduled yet.  Patient states that she has not been contacted yet.  Stable.  Needs some medication refills. Also complaining of multiple joint occasional pains.  Requesting medication. Patient has the following chronic medical problems: 1.  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  Has not seen a GI doctor in several years. 2.  Hypertension: On losartan 25 mg daily Lasix 40 mg daily. 3.  History of atrial fibrillation: On propranolol 40 mg twice a day. 4.  History of chronic depression: Sees psychiatrist on a regular basis.  On Zoloft 50 mg daily Last psychiatrist's office visit assessment and plan as follows: Assessment and Plan: Emily Butler is a 65 year old female, lives in [place], has a history of depression, alcohol use disorder in remission, atrial fibrillation, hepatitis C, was evaluated by telemedicine today.  Patient is currently stable.  Plan as noted below.  Plan MDD in full remission Zoloft 50 mg p.o. daily  Alcohol use disorder in remission Patient has been sober since the past 13 years.  Follow-up in clinic in office in 3 months.   This note was generated in part or whole with voice recognition software. Voice recognition is usually quite accurate but there are transcription errors that can and very often do occur. I apologize for any typographical errors that were not detected and corrected.   Jomarie Longs, MD 09/26/2020, 5:59 PM     HPI   Prior to Admission medications   Medication Sig Start Date End Date Taking? Authorizing Provider  furosemide (LASIX) 40 MG  tablet TAKE 1 TABLET BY MOUTH EVERY DAY 04/30/20  Yes Sowles, Danna Hefty, MD  loperamide (IMODIUM A-D) 2 MG tablet Take 1 tablet (2 mg total) by mouth 4 (four) times daily as needed for diarrhea or loose stools. 08/27/19  Yes Danelle Berry, PA-C  losartan (COZAAR) 25 MG tablet Take 1 tablet (25 mg total) by mouth daily. 08/04/20 02/03/21 Yes Richell Corker, Eilleen Kempf, MD  omeprazole (PRILOSEC) 20 MG capsule Take 1 capsule (20 mg total) by mouth daily. 12/18/20 02/03/21 Yes Unika Nazareno, Eilleen Kempf, MD  potassium chloride SA (KLOR-CON) 20 MEQ tablet Take 1 tablet (20 mEq total) by mouth daily. Take with lasix 08/17/19  Yes Danelle Berry, PA-C  propranolol (INDERAL) 40 MG tablet Take 1 tablet (40 mg total) by mouth 2 (two) times daily. 08/04/20 02/03/21 Yes Fabian Walder, Eilleen Kempf, MD  sertraline (ZOLOFT) 50 MG tablet Take 1 tablet (50 mg total) by mouth daily. 01/13/21  Yes Jomarie Longs, MD  Multiple Vitamin (MULTI-VITAMIN) tablet Take by mouth. Patient not taking: Reported on 01/13/2021    [provider]    Allergies  Allergen Reactions   Duloxetine Other (See Comments) and Nausea Only   Codeine     REACTION: GI upset    Patient Active Problem List   Diagnosis Date Noted   MDD (major depressive disorder), recurrent, in full remission (HCC) 09/26/2019   Hyperlipidemia 08/21/2019   MDD (major depressive disorder), recurrent, in partial remission (HCC) 06/25/2019   MDD (major depressive disorder), recurrent episode, mild (HCC) 12/11/2018  Alcohol use disorder, moderate, in sustained remission (HCC) 12/11/2018   Diarrhea 12/07/2018   IFG (impaired fasting glucose) 12/07/2018   Current severe episode of major depressive disorder without psychotic features (HCC) 12/07/2018   Insomnia 12/07/2018   Anemia due to chronic kidney disease 11/06/2014   Chronic kidney disease, unspecified 08/13/2013   Gastroesophageal reflux disease without esophagitis 08/13/2013   Restless legs syndrome (RLS) 05/23/2013    Umbilical hernia 10/31/2012   Atrial fibrillation (HCC) 07/07/2012   Hepatic cirrhosis (HCC) 07/07/2012   Former smoker 07/07/2012   Hypertension 07/07/2012   Chronic hepatitis C (HCC) 07/07/2012   THROMBOCYTOPENIA 12/20/2006    Past Medical History:  Diagnosis Date   Alcoholic (HCC)    Cirrhosis of liver (HCC)    Depression    EXCESSIVE MENSTRUATION 11/08/2006   Qualifier: Diagnosis of  By: Jillyn Hidden FNP, Mcarthur Rossetti    GERD (gastroesophageal reflux disease)    Hepatitis C 2011   Hypertension     Past Surgical History:  Procedure Laterality Date   CESAREAN SECTION      Social History   Socioeconomic History   Marital status: Divorced    Spouse name: Not on file   Number of children: 4   Years of education: Not on file   Highest education level: Not on file  Occupational History   Occupation: disability  Tobacco Use   Smoking status: Former   Smokeless tobacco: Never  Building services engineer Use: Never used  Substance and Sexual Activity   Alcohol use: Not Currently    Comment: Alcoholic quit 10 years ago   Drug use: Never   Sexual activity: Not Currently  Other Topics Concern   Not on file  Social History Narrative   Not on file   Social Determinants of Health   Financial Resource Strain: Not on file  Food Insecurity: Not on file  Transportation Needs: Not on file  Physical Activity: Not on file  Stress: Not on file  Social Connections: Not on file  Intimate Partner Violence: Not on file    Family History  Problem Relation Age of Onset   Alcohol abuse Mother    Drug abuse Mother    Heart disease Father      Review of Systems  Constitutional: Negative.  Negative for chills and fever.  HENT: Negative.  Negative for congestion and sore throat.   Respiratory: Negative.  Negative for cough and shortness of breath.   Cardiovascular: Negative.  Negative for chest pain and palpitations.  Gastrointestinal: Negative.  Negative for abdominal pain,  diarrhea, nausea and vomiting.  Genitourinary: Negative.   Skin: Negative.  Negative for rash.  Neurological: Negative.  Negative for dizziness and headaches.  All other systems reviewed and are negative. Today's Vitals   02/03/21 1447  BP: 122/78  Pulse: 73  SpO2: 98%  Weight: 189 lb (85.7 kg)  Height: 5\' 3"  (1.6 m)   Body mass index is 33.48 kg/m.   Physical Exam Vitals reviewed.  Constitutional:      Appearance: Normal appearance.  HENT:     Head: Normocephalic.  Eyes:     Extraocular Movements: Extraocular movements intact.     Pupils: Pupils are equal, round, and reactive to light.  Cardiovascular:     Rate and Rhythm: Normal rate. Rhythm irregular.     Pulses: Normal pulses.     Heart sounds: Normal heart sounds.  Pulmonary:     Effort: Pulmonary effort is normal.     Breath  sounds: Normal breath sounds.  Abdominal:     Palpations: Abdomen is soft.     Tenderness: There is no abdominal tenderness.  Musculoskeletal:     Cervical back: No tenderness.  Lymphadenopathy:     Cervical: No cervical adenopathy.  Skin:    General: Skin is warm and dry.     Capillary Refill: Capillary refill takes less than 2 seconds.  Neurological:     General: No focal deficit present.     Mental Status: She is alert and oriented to person, place, and time.  Psychiatric:        Mood and Affect: Mood normal.        Behavior: Behavior normal.     ASSESSMENT & PLAN: Problem List Items Addressed This Visit       Cardiovascular and Mediastinum   Atrial fibrillation (HCC)    Stable.  Continue propranolol 40 mg twice a day.      Relevant Medications   furosemide (LASIX) 40 MG tablet   Hypertension    Well-controlled on Lasix and propranolol. BP Readings from Last 3 Encounters:  02/03/21 122/78  08/04/20 116/78  08/17/19 140/82         Relevant Medications   furosemide (LASIX) 40 MG tablet     Digestive   Hepatic cirrhosis (HCC) - Primary    Stable.  Continue Lasix  40 mg daily.  Patient not on lactulose, has never been. Will place a second referral for GI evaluation.      Relevant Medications   loperamide (IMODIUM A-D) 2 MG tablet   Other Relevant Orders   Ambulatory referral to Gastroenterology     Other   Diarrhea   Relevant Medications   loperamide (IMODIUM A-D) 2 MG tablet   MDD (major depressive disorder), recurrent, in full remission (HCC)    Stable.  Continue Zoloft 50 mg daily.  Sees psychiatrist on a regular basis.      Arthralgia of multiple sites    Normal renal function.  Able to take ibuprofen.  Meloxicam has given her upper GI problems in the past.  Still wants to try diclofenac as needed.      Relevant Medications   diclofenac (VOLTAREN) 75 MG EC tablet   Other Visit Diagnoses     Essential hypertension       Relevant Medications   furosemide (LASIX) 40 MG tablet   Medication refill       Relevant Medications   furosemide (LASIX) 40 MG tablet   potassium chloride SA (KLOR-CON M) 20 MEQ tablet   Need for influenza vaccination       Relevant Orders   Flu Vaccine QUAD 2mo+IM (Fluarix, Fluzone & Alfiuria Quad PF) (Completed)      Patient Instructions  Cirrhosis Cirrhosis is long-term (chronic) liver injury. The liver is the body's largest internal organ, and it performs many functions. It converts food into energy, removes toxic material from the blood, makes important proteins, and absorbs necessary vitamins from food. In cirrhosis, healthy liver cells are replaced by scar tissue. This prevents blood from flowing through the liver and makes it difficult for the liver to complete its functions. What are the causes? Common causes of this condition are hepatitis C and long-term alcohol abuse. Other causes include: Nonalcoholic fatty liver disease (NAFLD). This happens when fat is deposited in the liver by causes other than alcohol. Hepatitis B infection. Autoimmune hepatitis. In this condition, the body's defense system  (immune system) mistakenly attacks the liver cells, causing  inflammation. Diseases that cause blockage of ducts inside the liver. Inherited liver diseases, such as hemochromatosis. This is one of the most common inherited liver diseases. In this disease, deposits of iron collect in the liver and other organs. Reactions to certain long-term medicines, such as amiodarone, a heart medicine. Parasitic infections. These include schistosomiasis, which is caused by a flatworm. Long-term contact to certain toxins. These toxins include certain organic solvents, such as toluene and chloroform. What increases the risk? You are more likely to develop this condition if: You have certain types of viral hepatitis. You abuse alcohol, especially if you are female. You are overweight. You use IV drugs and share needles. You have unprotected sex with someone who has viral hepatitis. What are the signs or symptoms? You may not have any signs and symptoms at first. Symptoms may not develop until the damage to your liver starts to get worse. Early symptoms may include: Weakness and tiredness (fatigue). Changes in sleep patterns or having trouble sleeping. Itchiness. Tenderness in the right-upper part of your abdomen. Weight loss and muscle loss. Nausea. Loss of appetite. Later symptoms may include: Fatigue or weakness that is getting worse. Yellow skin and eyes (jaundice). Buildup of fluid in the abdomen (ascites). You may notice that your clothes are tight around your waist. Weight gain and swelling of the feet and ankles (edema). Trouble breathing. Easy bruising and bleeding. Vomiting blood, or black or bloody stool. Mental confusion. How is this diagnosed? Your health care provider may suspect cirrhosis based on your symptoms and medical history, especially if you have other medical conditions or a history of alcohol abuse. Your health care provider will do a physical exam to feel your liver and to  check for signs of cirrhosis. Tests may include: Blood tests to check: For hepatitis B or C. Kidney function. Liver function. Imaging tests such as: MRI or CT scan to look for changes seen in advanced cirrhosis. Ultrasound to see if normal liver tissue is being replaced by scar tissue. A procedure in which a long needle is used to take a sample of liver tissue to be checked in a lab (biopsy). Liver biopsy can confirm the diagnosis of cirrhosis. How is this treated? Treatment for this condition depends on how damaged your liver is and what caused the damage. It may include treating the symptoms of cirrhosis, or treating the underlying causes to slow the damage. Treatment may include: Making lifestyle changes, such as: Eating a healthy diet. You may need to work with your health care provider or a dietitian to develop an eating plan. Restricting salt intake. Maintaining a healthy weight. Not abusing drugs or alcohol. Taking medicines to: Treat liver infections or other infections. Control itching. Reduce fluid buildup. Reduce certain blood toxins. Reduce risk of bleeding from enlarged blood vessels in the stomach or esophagus (varices). Liver transplant. In this procedure, a liver from a donor is used to replace your diseased liver. This is done if cirrhosis has caused liver failure. Other treatments and procedures may be done depending on the problems that you get from cirrhosis. Common problems include liver-related kidney failure (hepatorenal syndrome). Follow these instructions at home:  Take medicines only as told by your health care provider. Do not use medicines that are toxic to your liver. Ask your health care provider before taking any new medicines, including over-the-counter medicines such as NSAIDs. Rest as needed. Eat a well-balanced diet. Limit your salt or water intake, if your health care provider asks you to  do this. Do not drink alcohol. This is especially important if  you routinely take acetaminophen. Keep all follow-up visits. This is important. Contact a health care provider if you: Have fatigue or weakness that is getting worse. Develop swelling of the hands, feet, or legs, or a buildup of fluid in the abdomen (ascites). Have a fever or chills. Develop loss of appetite. Have nausea or vomiting. Develop jaundice. Develop easy bruising or bleeding. Get help right away if you: Vomit bright red blood or a material that looks like coffee grounds. Have blood in your stools. Notice that your stools appear black and tarry. Become confused. Have chest pain or trouble breathing. These symptoms may represent a serious problem that is an emergency. Do not wait to see if the symptoms will go away. Get medical help right away. Call your local emergency services (911 in the [place] Do not drive yourself to the hospital. Summary Cirrhosis is chronic liver injury. Common causes are hepatitis C and long-term alcohol abuse. Tests used to diagnose cirrhosis include blood tests, imaging tests, and liver biopsy. Treatment for this condition involves treating the underlying cause. Avoid alcohol, drugs, salt, and medicines that may damage your liver. Get help right away if you vomit bright red blood or a material that looks like coffee grounds. This information is not intended to replace advice given to you by your health care provider. Make sure you discuss any questions you have with your health care provider. Document Revised: 11/29/2019 Document Reviewed: 11/29/2019 Elsevier Patient Education  2022 Elsevier Inc.    Edwina Barth, MD [facility]

## 2021-02-03 NOTE — Assessment & Plan Note (Addendum)
Stable.  Continue Zoloft 50 mg daily.  Sees psychiatrist on a regular basis.

## 2021-02-03 NOTE — Patient Instructions (Signed)
Cirrhosis Cirrhosis is long-term (chronic) liver injury. The liver is the body's largest internal organ, and it performs many functions. It converts food into energy, removes toxic material from the blood, makes important proteins, and absorbs necessary vitamins from food. In cirrhosis, healthy liver cells are replaced by scar tissue. This prevents blood from flowing through the liver and makes it difficult for the liver to complete its functions. What are the causes? Common causes of this condition are hepatitis C and long-term alcohol abuse. Other causes include: Nonalcoholic fatty liver disease (NAFLD). This happens when fat is deposited in the liver by causes other than alcohol. Hepatitis B infection. Autoimmune hepatitis. In this condition, the body's defense system (immune system) mistakenly attacks the liver cells, causing inflammation. Diseases that cause blockage of ducts inside the liver. Inherited liver diseases, such as hemochromatosis. This is one of the most common inherited liver diseases. In this disease, deposits of iron collect in the liver and other organs. Reactions to certain long-term medicines, such as amiodarone, a heart medicine. Parasitic infections. These include schistosomiasis, which is caused by a flatworm. Long-term contact to certain toxins. These toxins include certain organic solvents, such as toluene and chloroform. What increases the risk? You are more likely to develop this condition if: You have certain types of viral hepatitis. You abuse alcohol, especially if you are female. You are overweight. You use IV drugs and share needles. You have unprotected sex with someone who has viral hepatitis. What are the signs or symptoms? You may not have any signs and symptoms at first. Symptoms may not develop until the damage to your liver starts to get worse. Early symptoms may include: Weakness and tiredness (fatigue). Changes in sleep patterns or having trouble  sleeping. Itchiness. Tenderness in the right-upper part of your abdomen. Weight loss and muscle loss. Nausea. Loss of appetite. Later symptoms may include: Fatigue or weakness that is getting worse. Yellow skin and eyes (jaundice). Buildup of fluid in the abdomen (ascites). You may notice that your clothes are tight around your waist. Weight gain and swelling of the feet and ankles (edema). Trouble breathing. Easy bruising and bleeding. Vomiting blood, or black or bloody stool. Mental confusion. How is this diagnosed? Your health care provider may suspect cirrhosis based on your symptoms and medical history, especially if you have other medical conditions or a history of alcohol abuse. Your health care provider will do a physical exam to feel your liver and to check for signs of cirrhosis. Tests may include: Blood tests to check: For hepatitis B or C. Kidney function. Liver function. Imaging tests such as: MRI or CT scan to look for changes seen in advanced cirrhosis. Ultrasound to see if normal liver tissue is being replaced by scar tissue. A procedure in which a long needle is used to take a sample of liver tissue to be checked in a lab (biopsy). Liver biopsy can confirm the diagnosis of cirrhosis. How is this treated? Treatment for this condition depends on how damaged your liver is and what caused the damage. It may include treating the symptoms of cirrhosis, or treating the underlying causes to slow the damage. Treatment may include: Making lifestyle changes, such as: Eating a healthy diet. You may need to work with your health care provider or a dietitian to develop an eating plan. Restricting salt intake. Maintaining a healthy weight. Not abusing drugs or alcohol. Taking medicines to: Treat liver infections or other infections. Control itching. Reduce fluid buildup. Reduce certain blood toxins.   Reduce risk of bleeding from enlarged blood vessels in the stomach or esophagus  (varices). Liver transplant. In this procedure, a liver from a donor is used to replace your diseased liver. This is done if cirrhosis has caused liver failure. Other treatments and procedures may be done depending on the problems that you get from cirrhosis. Common problems include liver-related kidney failure (hepatorenal syndrome). Follow these instructions at home:  Take medicines only as told by your health care provider. Do not use medicines that are toxic to your liver. Ask your health care provider before taking any new medicines, including over-the-counter medicines such as NSAIDs. Rest as needed. Eat a well-balanced diet. Limit your salt or water intake, if your health care provider asks you to do this. Do not drink alcohol. This is especially important if you routinely take acetaminophen. Keep all follow-up visits. This is important. Contact a health care provider if you: Have fatigue or weakness that is getting worse. Develop swelling of the hands, feet, or legs, or a buildup of fluid in the abdomen (ascites). Have a fever or chills. Develop loss of appetite. Have nausea or vomiting. Develop jaundice. Develop easy bruising or bleeding. Get help right away if you: Vomit bright red blood or a material that looks like coffee grounds. Have blood in your stools. Notice that your stools appear black and tarry. Become confused. Have chest pain or trouble breathing. These symptoms may represent a serious problem that is an emergency. Do not wait to see if the symptoms will go away. Get medical help right away. Call your local emergency services (911 in the [place] Do not drive yourself to the hospital. Summary Cirrhosis is chronic liver injury. Common causes are hepatitis C and long-term alcohol abuse. Tests used to diagnose cirrhosis include blood tests, imaging tests, and liver biopsy. Treatment for this condition involves treating the underlying cause. Avoid alcohol, drugs, salt,  and medicines that may damage your liver. Get help right away if you vomit bright red blood or a material that looks like coffee grounds. This information is not intended to replace advice given to you by your health care provider. Make sure you discuss any questions you have with your health care provider. Document Revised: 11/29/2019 Document Reviewed: 11/29/2019 Elsevier Patient Education  2022 Elsevier Inc.  

## 2021-02-03 NOTE — Assessment & Plan Note (Signed)
Stable.  Continue Lasix 40 mg daily.  Patient not on lactulose, has never been. Will place a second referral for GI evaluation.

## 2021-02-10 ENCOUNTER — Encounter: Payer: Self-pay | Admitting: Emergency Medicine

## 2021-02-10 NOTE — Telephone Encounter (Signed)
Thank you.  Just made my day.

## 2021-02-22 ENCOUNTER — Other Ambulatory Visit: Payer: Self-pay | Admitting: Psychiatry

## 2021-02-22 ENCOUNTER — Other Ambulatory Visit: Payer: Self-pay | Admitting: Emergency Medicine

## 2021-02-22 DIAGNOSIS — K219 Gastro-esophageal reflux disease without esophagitis: Secondary | ICD-10-CM

## 2021-02-22 DIAGNOSIS — F3342 Major depressive disorder, recurrent, in full remission: Secondary | ICD-10-CM

## 2021-02-22 DIAGNOSIS — Z760 Encounter for issue of repeat prescription: Secondary | ICD-10-CM

## 2021-02-28 ENCOUNTER — Other Ambulatory Visit: Payer: Self-pay | Admitting: Emergency Medicine

## 2021-02-28 DIAGNOSIS — Z760 Encounter for issue of repeat prescription: Secondary | ICD-10-CM

## 2021-02-28 DIAGNOSIS — I10 Essential (primary) hypertension: Secondary | ICD-10-CM

## 2021-03-13 ENCOUNTER — Telehealth: Payer: Self-pay | Admitting: Emergency Medicine

## 2021-03-13 DIAGNOSIS — I10 Essential (primary) hypertension: Secondary | ICD-10-CM

## 2021-03-13 DIAGNOSIS — I4891 Unspecified atrial fibrillation: Secondary | ICD-10-CM

## 2021-03-13 DIAGNOSIS — Z760 Encounter for issue of repeat prescription: Secondary | ICD-10-CM

## 2021-03-13 NOTE — Telephone Encounter (Signed)
1.Medication Requested: propranolol (INDERAL) 40 MG tablet (Expired)  2. Pharmacy (Name, Street, [place]): [facility] [phone] - [place], [place] - [address]  3. On Med List: yes   4. Last Visit with PCP: 02-03-2021  5. Next visit date with PCP: 03-17-2021   Patient states she is out of medication and she discussed refill w/ provider at 12-6 ov

## 2021-03-17 ENCOUNTER — Ambulatory Visit: Payer: Medicare Other | Admitting: Emergency Medicine

## 2021-03-17 MED ORDER — PROPRANOLOL HCL 40 MG PO TABS: 40.0000 mg | ORAL_TABLET | Freq: Two times a day (BID) | ORAL | 1 refills | Status: DC

## 2021-03-17 NOTE — Telephone Encounter (Signed)
Medication refilled

## 2021-04-03 ENCOUNTER — Other Ambulatory Visit: Payer: Self-pay | Admitting: Emergency Medicine

## 2021-04-03 DIAGNOSIS — M2550 Pain in unspecified joint: Secondary | ICD-10-CM

## 2021-04-23 ENCOUNTER — Ambulatory Visit (INDEPENDENT_AMBULATORY_CARE_PROVIDER_SITE_OTHER): Payer: Medicare Other | Admitting: Psychiatry

## 2021-04-23 ENCOUNTER — Other Ambulatory Visit: Payer: Self-pay

## 2021-04-23 ENCOUNTER — Encounter: Payer: Self-pay | Admitting: Psychiatry

## 2021-04-23 VITALS — BP 104/69 | HR 75 | Temp 98.3°F | Wt 186.4 lb

## 2021-04-23 DIAGNOSIS — F3342 Major depressive disorder, recurrent, in full remission: Secondary | ICD-10-CM | POA: Diagnosis not present

## 2021-04-23 DIAGNOSIS — F1021 Alcohol dependence, in remission: Secondary | ICD-10-CM | POA: Diagnosis not present

## 2021-04-23 MED ORDER — SERTRALINE HCL 50 MG PO TABS: 50.0000 mg | ORAL_TABLET | Freq: Every day | ORAL | 1 refills | Status: DC

## 2021-04-23 NOTE — Progress Notes (Signed)
[facility] OP Progress Note  04/23/2021 6:05 PM Emily Butler  MRN:  [id number]  Chief Complaint:  Chief Complaint  Patient presents with   Follow-up: 66 year old female, who has a history of MDD, alcohol use disorder in remission, presented for medication management.   HPI: Emily Butler is a 66 year old female, retired, disabled, lives in [place], has a history of MDD, alcohol use disorder in remission, history of cirrhosis of liver without ascites, essential hypertension, gastroesophageal reflux disease without esophagitis, atrial fibrillation was evaluated in office today.  Patient reports overall she has been doing fairly well.  She has been spending a lot of time doing her painting projects.  Patient reports she enjoys doing that.    Denies any significant sadness or anxiety symptoms.  Reports sleep is overall good.  Reports she is compliant on the Zoloft.  Denies side effects.  Reports her older daughter is worried about her being socially isolated.  Patient however does not believe she is isolated since she does have contact with some friends, goes out for walks and does not feel lonely since she is preoccupied with her painting projects when she is at home.  Denies any suicidality, homicidality or perceptual disturbances.  Patient denies any other concerns today.  Visit Diagnosis:    ICD-10-CM   1. MDD (major depressive disorder), recurrent, in full remission (Emily Butler)  F33.42 sertraline (ZOLOFT) 50 MG tablet    2. Alcohol use disorder, moderate, in sustained remission (Emily Butler)  F10.21       Past Psychiatric History: Reviewed past psychiatric history from progress note on 12/11/2018.  Past Medical History:  Past Medical History:  Diagnosis Date   Alcoholic ([place])    Cirrhosis of liver ([place])    Depression    EXCESSIVE MENSTRUATION 11/08/2006   Qualifier: Diagnosis of  By: Maxie Better FNP, Rosalita Levan    GERD (gastroesophageal reflux disease)    Hepatitis C 2011    Hypertension     Past Surgical History:  Procedure Laterality Date   CESAREAN SECTION      Family Psychiatric History: Reviewed family psychiatric history from progress note on 12/11/2018.  Family History:  Family History  Problem Relation Age of Onset   Alcohol abuse Mother    Drug abuse Mother    Heart disease Father     Social History: Reviewed social history from progress note on 12/11/2018. Social History   Socioeconomic History   Marital status: Divorced    Spouse name: Not on file   Number of children: 4   Years of education: Not on file   Highest education level: Not on file  Occupational History   Occupation: disability  Tobacco Use   Smoking status: Former   Smokeless tobacco: Never  Scientific laboratory technician Use: Never used  Substance and Sexual Activity   Alcohol use: Not Currently    Comment: Alcoholic quit 10 years ago   Drug use: Never   Sexual activity: Not Currently  Other Topics Concern   Not on file  Social History Narrative   Not on file   Social Determinants of Health   Financial Resource Strain: Not on file  Food Insecurity: Not on file  Transportation Needs: Not on file  Physical Activity: Not on file  Stress: Not on file  Social Connections: Not on file    Allergies:  Allergies  Allergen Reactions   Duloxetine Other (See Comments) and Nausea Only   Codeine     REACTION: GI upset  Metabolic Disorder Labs: Lab Results  Component Value Date   HGBA1C 5.3 08/17/2018   MPG 105 08/17/2018   No results found for: PROLACTIN Lab Results  Component Value Date   CHOL 228 (H) 08/17/2019   TRIG 121 08/17/2019   HDL 59 08/17/2019   CHOLHDL 3.9 08/17/2019   VLDL 19 02/04/2012   LDLCALC 145 (H) 08/17/2019   LDLCALC 107 (H) 08/17/2018   No results found for: TSH  Therapeutic Level Labs: No results found for: LITHIUM No results found for: VALPROATE No components found for:  CBMZ  Current Medications: Current Outpatient Medications   Medication Sig Dispense Refill   diclofenac (VOLTAREN) 75 MG EC tablet Take 1 tablet (75 mg total) by mouth 2 (two) times daily as needed for moderate pain (Recommend not to take every day). 30 tablet 1   furosemide (LASIX) 40 MG tablet Take 1 tablet (40 mg total) by mouth daily. 90 tablet 3   loperamide (IMODIUM A-D) 2 MG tablet Take 1 tablet (2 mg total) by mouth 4 (four) times daily as needed for diarrhea or loose stools. 30 tablet 1   losartan (COZAAR) 25 MG tablet TAKE 1 TABLET (25 MG TOTAL) BY MOUTH DAILY. 90 tablet 1   Multiple Vitamin (MULTI-VITAMIN) tablet Take by mouth.     omeprazole (PRILOSEC) 20 MG capsule TAKE 1 CAPSULE BY MOUTH EVERY DAY 90 capsule 1   potassium chloride SA (KLOR-CON M) 20 MEQ tablet Take 1 tablet (20 mEq total) by mouth daily. Take with lasix 90 tablet 3   propranolol (INDERAL) 40 MG tablet Take 1 tablet (40 mg total) by mouth 2 (two) times daily. 180 tablet 1   sertraline (ZOLOFT) 50 MG tablet Take 1 tablet (50 mg total) by mouth daily. 90 tablet 1   No current facility-administered medications for this visit.     Musculoskeletal: Strength & Muscle Tone: within normal limits Gait & Station: normal Patient leans: N/A  Psychiatric Specialty Exam: Review of Systems  Psychiatric/Behavioral:  Negative for agitation, behavioral problems, confusion, decreased concentration, dysphoric mood, hallucinations, self-injury, sleep disturbance and suicidal ideas. The patient is not nervous/anxious and is not hyperactive.   All other systems reviewed and are negative.  Blood pressure 104/69, pulse 75, temperature 98.3 F (36.8 C), temperature source Temporal, weight 186 lb 6.4 oz (84.6 kg).Body mass index is 33.02 kg/m.  General Appearance: Casual  Eye Contact:  Fair  Speech:  Clear and Coherent  Volume:  Normal  Mood:  Euthymic  Affect:  Congruent  Thought Process:  Goal Directed and Descriptions of Associations: Intact  Orientation:  Full (Time, Place, and  Person)  Thought Content: Logical   Suicidal Thoughts:  No  Homicidal Thoughts:  No  Memory:  Immediate;   Fair Recent;   Fair Remote;   Fair  Judgement:  Fair  Insight:  Fair  Psychomotor Activity:  Normal  Concentration:  Concentration: Fair and Attention Span: Fair  Recall:  [employer] of Knowledge: Fair  Language: Fair  Akathisia:  No  Handed:  Right  AIMS (if indicated):  done, 0  Assets:  Communication Skills Desire for Improvement Housing Social Support  ADL's:  Intact  Cognition: WNL  Sleep:  Fair   Screenings: GAD-7    Flowsheet Row Video Visit from 01/13/2021 in [place] Office Visit from 08/11/2018 in [facility]  Total GAD-7 Score 2 4      PHQ2-9    [place] Office Visit from 04/23/2021  in [place] Video Visit from 01/13/2021 in [place] Video Visit from 09/25/2020 in [place] Office Visit from 08/04/2020 in [place] at [facility] Video Visit from 07/15/2020 in [place]  PHQ-2 Total Score 0 0 0 0 0  PHQ-9 Total Score -- 1 4 -- 0      Flowsheet Row Video Visit from 09/25/2020 in [place] [facility]        Assessment and Plan: Emily Butler is a 66 year old female, lives in [place], has a history of depression, alcohol use disorder in remission, atrial fibrillation, hepatitis C was evaluated in office today.  Patient is currently stable.  Plan as noted below.  Plan MDD in full remission Zoloft 50 mg p.o. daily.  Alcohol use disorder in remission Has been sober since the past 13 years.  Pending labs-TSH-patient to get it done.  Follow-up in clinic in 3 months or sooner if needed.  Consent: Patient/Guardian gives verbal consent for treatment and assignment of benefits for services provided during this visit.  Patient/Guardian expressed understanding and agreed to proceed.   This note was generated in part or whole with voice recognition software. Voice recognition is usually quite accurate but there are transcription errors that can and very often do occur. I apologize for any typographical errors that were not detected and corrected.      Ursula Alert, MD 04/24/2021, 8:25 AM

## 2021-05-08 ENCOUNTER — Other Ambulatory Visit: Payer: Self-pay | Admitting: Emergency Medicine

## 2021-05-08 DIAGNOSIS — M2550 Pain in unspecified joint: Secondary | ICD-10-CM

## 2021-05-17 ENCOUNTER — Emergency Department: Payer: Medicare Other

## 2021-05-17 ENCOUNTER — Other Ambulatory Visit: Payer: Self-pay

## 2021-05-17 ENCOUNTER — Emergency Department
Admission: EM | Admit: 2021-05-17 | Discharge: 2021-05-17 | Disposition: A | Discharge status: Home / Self Care | Payer: Medicare Other | Attending: Emergency Medicine | Admitting: Emergency Medicine

## 2021-05-17 DIAGNOSIS — M5432 Sciatica, left side: Secondary | ICD-10-CM | POA: Diagnosis not present

## 2021-05-17 DIAGNOSIS — Z87891 Personal history of nicotine dependence: Secondary | ICD-10-CM | POA: Insufficient documentation

## 2021-05-17 DIAGNOSIS — N189 Chronic kidney disease, unspecified: Secondary | ICD-10-CM | POA: Diagnosis not present

## 2021-05-17 DIAGNOSIS — M25471 Effusion, right ankle: Secondary | ICD-10-CM | POA: Diagnosis not present

## 2021-05-17 DIAGNOSIS — G8929 Other chronic pain: Secondary | ICD-10-CM | POA: Diagnosis not present

## 2021-05-17 DIAGNOSIS — M25571 Pain in right ankle and joints of right foot: Secondary | ICD-10-CM | POA: Diagnosis present

## 2021-05-17 MED ORDER — KETOROLAC TROMETHAMINE 30 MG/ML IJ SOLN
30.0000 mg | Freq: Once | INTRAMUSCULAR | Status: AC
  Administered 2021-05-17: 30 mg via INTRAMUSCULAR
  Filled 2021-05-17: qty 1

## 2021-05-17 MED ORDER — CYCLOBENZAPRINE HCL 10 MG PO TABS: 10.0000 mg | ORAL_TABLET | Freq: Three times a day (TID) | ORAL | 0 refills | Status: AC | PRN

## 2021-05-17 NOTE — ED Notes (Signed)
First nurse note: pt comes ems with right ankle pain and left hip pain after slipping/hyperextending her legs on some water. Hurts with pressure. No relief with ice or rest. 170/70 with hx of HTN.  ?

## 2021-05-17 NOTE — ED Notes (Signed)
See triage note  presents with right ankle pain  states she slipped on Friday   states she thinks she may have hyper extended min swelling noted  good pulses   states having increased pain with standing or walking ?

## 2021-05-17 NOTE — ED Provider Notes (Signed)
? ?[facility] ?Provider Note ? ? ? Event Date/Time  ? First MD Initiated Contact with Patient 05/17/21 1339   ?  (approximate) ? ? ?History  ? ?Chief Complaint ?Ankle Pain ? ? ?HPI ?Emily Butler is a 66 y.o. female, history of atrial fibrillation, CKD, thrombocytopenia, restless leg syndrome, alcohol use disorder, depression, hyperlipidemia, GERD, former smoker, presents to the emergency department for evaluation of ankle pain.  Patient states that her ankle pain began approximately 35 years ago following a bad sprain.  She states that she has been treating with Voltaren given to her by orthopedics, however she believes that her body is getting used to the Voltaren and is no longer effective.  She states that she is still been walking on her ankle, but she states that the pain has been getting worse.  Additionally, she states that she believes she is having a sciatic flare on her left side.  Reports significant pain when straining her leg.  Denies fever/chills, numbness/tingling in upper or lower extremities, bowel/bladder dysfunction, chest pain, shortness of breath, nausea/vomiting, abdominal pain, or lightheadedness/dizziness. ? ?History Limitations: No limitations. ? ?  ? ? ?Physical Exam  ?Triage Vital Signs: ?ED Triage Vitals [05/17/21 1331]  ?Enc Vitals Group  ?   BP   ?   Pulse   ?   Resp   ?   Temp   ?   Temp src   ?   SpO2   ?   Weight 185 lb (83.9 kg)  ?   Height 5\' 3"  (1.6 m)  ?   Head Circumference   ?   Peak Flow   ?   Pain Score 6  ?   Pain Loc   ?   Pain Edu?   ?   Excl. in [place]?   ? ? ?Most recent vital signs: ?Vitals:  ? 05/17/21 1341 05/17/21 1342  ?BP: (!) 170/100   ?Pulse:  88  ?Resp: 18 18  ?Temp:  98 ?F (36.7 ?C)  ?SpO2: 98%   ? ? ?General: Awake, NAD.  ?Skin: Warm, dry.  ?CV: Good peripheral perfusion.  ?Resp: Normal effort.  ?Abd: Soft, non-tender. No distention.  ?Neuro: At baseline. No gross neurological deficits.  ?Other: No gross deformities along the right ankle.   Mild swelling appreciated along lateral malleolus.  Bony tenderness appreciated along the tarsals.  Pulse, motor, sensation intact distally.  Range of motion intact.  Patient is able to ambulate across the room without assistance.  No gross deformities or tenderness along the left lower extremity, though patient does endorse significant pain with straight leg test. ? ?Physical Exam ? ? ? ?ED Results / Procedures / Treatments  ?Labs ?(all labs ordered are listed, but only abnormal results are displayed) ?Labs Reviewed - No data to display ? ? ?EKG ?Not applicable. ? ? ?RADIOLOGY ? ?ED Provider Interpretation: I personally reviewed and interpreted this x-ray, no evidence of acute fracture ? ?DG Ankle Complete Right ? ?Result Date: 05/17/2021 ?CLINICAL DATA:  Pain after injury. EXAM: RIGHT ANKLE - COMPLETE 3+ VIEW COMPARISON:  None. FINDINGS: Lateral soft tissue swelling. Degenerative changes in the hindfoot. Calcaneal plantar spur. No acute fractures are identified. Degenerative changes are seen in ankle. Sequela of previous medial malleolar avulsion injury. No other acute abnormalities. IMPRESSION: Lateral soft tissue swelling.  No other acute abnormality. Electronically Signed   By: Dorise Bullion III M.D.   On: 05/17/2021 14:36   ? ?PROCEDURES: ? ?Critical Care performed:  None. ? ?Procedures ? ? ? ?MEDICATIONS ORDERED IN ED: ?Medications  ?ketorolac (TORADOL) 30 MG/ML injection 30 mg (30 mg Intramuscular Given 05/17/21 1407)  ? ? ? ?IMPRESSION / MDM / ASSESSMENT AND PLAN / ED COURSE  ?I reviewed the triage vital signs and the nursing notes. ?             ?               ? ? ?Differential diagnosis includes, but is not limited to, ankle sprain, medial/lateral malleolus fracture, tarsal fracture, sciatica. ? ?ED Course ?Patient appears well.  Vital signs within normal limits for the patient.  NAD. ? ?X-ray shows swelling along the lateral malleolus, otherwise no evidence of fracture or  dislocation. ? ?Assessment/Plan ?Patient presents with right-sided ankle pain.  X-ray shows mild swelling along the lateral malleolus, otherwise no evidence of fracture or dislocation.  Patient still able to ambulate without difficulty.  Likely mild soft tissue injury.  Physical exam not suggestive of any significant rupture of the ligaments.  Will provide patient with a ankle brace and a referral to orthopedics to be utilized if her pain continues to bother after a few weeks of conservative management.  Encouraged her to take Tylenol/ibuprofen as needed patient additionally endorsed pain related to her known sciatica.  Treated the patient here with ketorolac.  We will provide a prescription for cyclobenzaprine as well.  Patient expressed understanding and agreed with the plan. ? ?Patient was provided with anticipatory guidance, return precautions, and educational material. Encouraged the patient to return to the emergency department at any time if they begin to experience any new or worsening symptoms.  ? ?  ? ? ?FINAL CLINICAL IMPRESSION(S) / ED DIAGNOSES  ? ?Final diagnoses:  ?Chronic pain of right ankle  ?Sciatica of left side  ? ? ? ?Rx / DC Orders  ? ?ED Discharge Orders   ? ?      Ordered  ?  cyclobenzaprine (FLEXERIL) 10 MG tablet  3 times daily PRN       ? 05/17/21 1509  ? ?  ?  ? ?  ? ? ? ?Note:  This document was prepared using Dragon voice recognition software and may include unintentional dictation errors. ?  ?Teodoro Spray, [place] ?05/17/21 1814 ? ?  ?Rada Hay, MD ?05/17/21 1945 ? ?

## 2021-05-17 NOTE — Discharge Instructions (Addendum)
-  Follow-up with the orthopedist listed above. ?-Take Tylenol/ibuprofen as needed for pain.  Use cyclobenzaprine as needed for sciatica. ?-Return to the emergency department anytime if you begin to experience any new or worsening symptoms. ?

## 2021-06-04 ENCOUNTER — Telehealth: Payer: Self-pay | Admitting: Emergency Medicine

## 2021-06-04 NOTE — Telephone Encounter (Signed)
LVM for pt to rtn my call to [facility] with [facility]. Please schedule this appt if pt calls the office.  ?

## 2021-06-22 ENCOUNTER — Other Ambulatory Visit: Payer: Self-pay | Admitting: Emergency Medicine

## 2021-06-22 DIAGNOSIS — M2550 Pain in unspecified joint: Secondary | ICD-10-CM

## 2021-07-28 ENCOUNTER — Other Ambulatory Visit: Payer: Self-pay | Admitting: Emergency Medicine

## 2021-07-28 DIAGNOSIS — M2550 Pain in unspecified joint: Secondary | ICD-10-CM

## 2021-07-28 MED ORDER — DICLOFENAC SODIUM 75 MG PO TBEC: 75.0000 mg | DELAYED_RELEASE_TABLET | Freq: Two times a day (BID) | ORAL | 0 refills | Status: DC | PRN

## 2021-08-06 ENCOUNTER — Ambulatory Visit (INDEPENDENT_AMBULATORY_CARE_PROVIDER_SITE_OTHER): Payer: Medicare Other | Admitting: Emergency Medicine

## 2021-08-06 ENCOUNTER — Encounter: Payer: Self-pay | Admitting: Emergency Medicine

## 2021-08-06 VITALS — BP 120/64 | HR 66 | Temp 98.6°F | Ht 63.0 in | Wt 185.0 lb

## 2021-08-06 DIAGNOSIS — K219 Gastro-esophageal reflux disease without esophagitis: Secondary | ICD-10-CM

## 2021-08-06 DIAGNOSIS — I4891 Unspecified atrial fibrillation: Secondary | ICD-10-CM | POA: Diagnosis not present

## 2021-08-06 DIAGNOSIS — K7460 Unspecified cirrhosis of liver: Secondary | ICD-10-CM

## 2021-08-06 DIAGNOSIS — H43391 Other vitreous opacities, right eye: Secondary | ICD-10-CM | POA: Diagnosis not present

## 2021-08-06 DIAGNOSIS — M2550 Pain in unspecified joint: Secondary | ICD-10-CM

## 2021-08-06 DIAGNOSIS — I10 Essential (primary) hypertension: Secondary | ICD-10-CM

## 2021-08-06 LAB — COMPREHENSIVE METABOLIC PANEL
ALT: 15 U/L (ref 0–35)
AST: 24 U/L (ref 0–37)
Albumin: 4.8 g/dL (ref 3.5–5.2)
Alkaline Phosphatase: 107 U/L (ref 39–117)
BUN: 22 mg/dL (ref 6–23)
CO2: 29 mEq/L (ref 19–32)
Calcium: 10.2 mg/dL (ref 8.4–10.5)
Chloride: 99 mEq/L (ref 96–112)
Creatinine, Ser: 1.07 mg/dL (ref 0.40–1.20)
GFR: 54.98 mL/min — ABNORMAL LOW (ref >60.00)
Glucose, Bld: 108 mg/dL — ABNORMAL HIGH (ref 70–99)
Potassium: 4.5 mEq/L (ref 3.5–5.1)
Sodium: 136 mEq/L (ref 135–145)
Total Bilirubin: 1 mg/dL (ref 0.2–1.2)
Total Protein: 7 g/dL (ref 6.0–8.3)

## 2021-08-06 LAB — CBC WITH DIFFERENTIAL/PLATELET
Basophils Absolute: 0.1 10*3/uL (ref 0.0–0.1)
Basophils Relative: 1 % (ref 0.0–3.0)
Eosinophils Absolute: 0.1 10*3/uL (ref 0.0–0.7)
Eosinophils Relative: 1.5 % (ref 0.0–5.0)
HCT: 39.5 % (ref 36.0–46.0)
Hemoglobin: 13.7 g/dL (ref 12.0–15.0)
Lymphocytes Relative: 16.9 % (ref 12.0–46.0)
Lymphs Abs: 0.9 10*3/uL (ref 0.7–4.0)
MCHC: 34.8 g/dL (ref 30.0–36.0)
MCV: 96 fl (ref 78.0–100.0)
Monocytes Absolute: 0.3 10*3/uL (ref 0.1–1.0)
Monocytes Relative: 5.9 % (ref 3.0–12.0)
Neutro Abs: 4.2 10*3/uL (ref 1.4–7.7)
Neutrophils Relative %: 74.7 % (ref 43.0–77.0)
Platelets: 87 10*3/uL — ABNORMAL LOW (ref 150.0–400.0)
RBC: 4.11 Mil/uL (ref 3.87–5.11)
RDW: 13.4 % (ref 11.5–15.5)
WBC: 5.6 10*3/uL (ref 4.0–10.5)

## 2021-08-06 LAB — LIPID PANEL
Cholesterol: 221 mg/dL — ABNORMAL HIGH (ref 0–200)
HDL: 56.5 mg/dL (ref >39.00)
LDL Cholesterol: 138 mg/dL — ABNORMAL HIGH (ref 0–99)
NonHDL: 164.9
Total CHOL/HDL Ratio: 4
Triglycerides: 134 mg/dL (ref 0.0–149.0)
VLDL: 26.8 mg/dL (ref 0.0–40.0)

## 2021-08-06 LAB — TSH: TSH: 2.12 u[IU]/mL (ref 0.35–5.50)

## 2021-08-06 NOTE — Assessment & Plan Note (Signed)
Asymptomatic.  Takes omeprazole 20 mg as needed.

## 2021-08-06 NOTE — Progress Notes (Signed)
Emily Butler 66 y.o.   Chief Complaint  Patient presents with   59mo follow up    Would like labs and thyroid checked. Also HepC   Eye concerns    Floater in right eye    HISTORY OF PRESENT ILLNESS: This is a 66 y.o. female here for 87-month follow-up. Has history of liver cirrhosis.  GI referral was placed on 08/04/2020 but not interested in scheduling. Patient has the following chronic medical problems: 1.  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  Has not seen a GI doctor in several years. 2.  Hypertension: On losartan 25 mg daily Lasix 40 mg daily. 3.  History of atrial fibrillation: On propranolol 40 mg twice a day. 4.  History of chronic depression: Sees psychiatrist on a regular basis.  On Zoloft 50 mg daily 5.  History of chronic pain to multiple joints.  Diclofenac working very well for her.  HPI   Prior to Admission medications   Medication Sig Start Date End Date Taking? Authorizing Provider  diclofenac (VOLTAREN) 75 MG EC tablet Take 1 tablet (75 mg total) by mouth 2 (two) times daily as needed. Do not take every day. 07/28/21  Yes Emberlynn Riggan, Eilleen Kempf, MD  loperamide (IMODIUM A-D) 2 MG tablet Take 1 tablet (2 mg total) by mouth 4 (four) times daily as needed for diarrhea or loose stools. 02/03/21  Yes SagardiaEilleen Kempf, MD  Multiple Vitamin (MULTI-VITAMIN) tablet Take by mouth.   Yes [provider]  omeprazole (PRILOSEC) 20 MG capsule TAKE 1 CAPSULE BY MOUTH EVERY DAY 02/25/21  Yes Leeandra Ellerson, Eilleen Kempf, MD  potassium chloride SA (KLOR-CON M) 20 MEQ tablet Take 1 tablet (20 mEq total) by mouth daily. Take with lasix 02/03/21  Yes Shamonique Battiste, Eilleen Kempf, MD  sertraline (ZOLOFT) 50 MG tablet Take 1 tablet (50 mg total) by mouth daily. 04/23/21  Yes Jomarie Longs, MD  furosemide (LASIX) 40 MG tablet Take 1 tablet (40 mg total) by mouth daily. 02/03/21 05/04/21  Georgina Quint, MD  losartan (COZAAR) 25 MG tablet TAKE 1 TABLET (25 MG TOTAL) BY  MOUTH DAILY. 02/28/21 05/29/21  Georgina Quint, MD  propranolol (INDERAL) 40 MG tablet Take 1 tablet (40 mg total) by mouth 2 (two) times daily. 03/17/21 06/15/21  Georgina Quint, MD    Allergies  Allergen Reactions   Duloxetine Other (See Comments) and Nausea Only   Codeine     REACTION: GI upset    Patient Active Problem List   Diagnosis Date Noted   Arthralgia of multiple sites 02/03/2021   MDD (major depressive disorder), recurrent, in full remission (HCC) 09/26/2019   Hyperlipidemia 08/21/2019   MDD (major depressive disorder), recurrent, in partial remission (HCC) 06/25/2019   MDD (major depressive disorder), recurrent episode, mild (HCC) 12/11/2018   Alcohol use disorder, moderate, in sustained remission (HCC) 12/11/2018   Diarrhea 12/07/2018   IFG (impaired fasting glucose) 12/07/2018   Current severe episode of major depressive disorder without psychotic features (HCC) 12/07/2018   Insomnia 12/07/2018   Anemia due to chronic kidney disease 11/06/2014   Chronic kidney disease, unspecified 08/13/2013   Gastroesophageal reflux disease without esophagitis 08/13/2013   Restless legs syndrome (RLS) 05/23/2013   Umbilical hernia 10/31/2012   Atrial fibrillation (HCC) 07/07/2012   Hepatic cirrhosis (HCC) 07/07/2012   Former smoker 07/07/2012   Hypertension 07/07/2012   Chronic hepatitis C (HCC) 07/07/2012   THROMBOCYTOPENIA 12/20/2006    Past Medical History:  Diagnosis Date  Alcoholic (HCC)    Cirrhosis of liver (HCC)    Depression    EXCESSIVE MENSTRUATION 11/08/2006   Qualifier: Diagnosis of  By: Jillyn Hidden FNP, Mcarthur Rossetti    GERD (gastroesophageal reflux disease)    Hepatitis C 2011   Hypertension     Past Surgical History:  Procedure Laterality Date   CESAREAN SECTION      Social History   Socioeconomic History   Marital status: Divorced    Spouse name: Not on file   Number of children: 4   Years of education: Not on file   Highest  education level: Not on file  Occupational History   Occupation: disability  Tobacco Use   Smoking status: Former   Smokeless tobacco: Never  Building services engineer Use: Never used  Substance and Sexual Activity   Alcohol use: Not Currently    Comment: Alcoholic quit 10 years ago   Drug use: Never   Sexual activity: Not Currently  Other Topics Concern   Not on file  Social History Narrative   Not on file   Social Determinants of Health   Financial Resource Strain: Not on file  Food Insecurity: Not on file  Transportation Needs: Unmet Transportation Needs (01/31/2020)   PRAPARE - Transportation    Lack of Transportation (Medical): Yes    Lack of Transportation (Non-Medical): Yes  Physical Activity: Inactive (01/31/2020)   Exercise Vital Sign    Days of Exercise per Week: 0 days    Minutes of Exercise per Session: 0 min  Stress: Stress Concern Present (08/11/2018)   [employer] of Occupational Health - Occupational Stress Questionnaire    Feeling of Stress : To some extent  Social Connections: Socially Isolated (01/31/2020)   Social Connection and Isolation Panel [NHANES]    Frequency of Communication with Friends and Family: More than three times a week    Frequency of Social Gatherings with Friends and Family: More than three times a week    Attends Religious Services: Never    Database administrator or Organizations: No    Attends Banker Meetings: Never    Marital Status: Divorced  Catering manager Violence: Not on file    Family History  Problem Relation Age of Onset   Alcohol abuse Mother    Drug abuse Mother    Heart disease Father      Review of Systems  Constitutional: Negative.  Negative for fever.  HENT: Negative.  Negative for congestion and sore throat.   Eyes:        Floaters in right eye  Respiratory: Negative.  Negative for cough and shortness of breath.   Cardiovascular: Negative.  Negative for chest pain and palpitations.   Gastrointestinal: Negative.  Negative for abdominal pain, nausea and vomiting.  Genitourinary: Negative.  Negative for dysuria and hematuria.  Musculoskeletal:  Positive for joint pain.  Skin: Negative.  Negative for rash.  Neurological: Negative.  Negative for dizziness and headaches.  All other systems reviewed and are negative.  Today's Vitals   08/06/21 1456  BP: 120/64  Pulse: 66  Temp: 98.6 F (37 C)  SpO2: 92%  Weight: 185 lb (83.9 kg)  Height: 5\' 3"  (1.6 m)   Body mass index is 32.77 kg/m. Wt Readings from Last 3 Encounters:  08/06/21 185 lb (83.9 kg)  05/17/21 185 lb (83.9 kg)  02/03/21 189 lb (85.7 kg)     Physical Exam Vitals reviewed.  Constitutional:      Appearance:  Normal appearance.  HENT:     Head: Normocephalic.     Mouth/Throat:     Mouth: Mucous membranes are moist.     Pharynx: Oropharynx is clear.  Eyes:     Extraocular Movements: Extraocular movements intact.     Conjunctiva/sclera: Conjunctivae normal.     Pupils: Pupils are equal, round, and reactive to light.  Cardiovascular:     Rate and Rhythm: Normal rate and regular rhythm.     Pulses: Normal pulses.     Heart sounds: Normal heart sounds.  Pulmonary:     Effort: Pulmonary effort is normal.     Breath sounds: Normal breath sounds.  Abdominal:     Palpations: Abdomen is soft.     Tenderness: There is no abdominal tenderness.  Musculoskeletal:     Cervical back: No tenderness.     Right lower leg: No edema.     Left lower leg: No edema.  Lymphadenopathy:     Cervical: No cervical adenopathy.  Skin:    General: Skin is warm and dry.     Capillary Refill: Capillary refill takes less than 2 seconds.  Neurological:     General: No focal deficit present.     Mental Status: She is alert and oriented to person, place, and time.  Psychiatric:        Mood and Affect: Mood normal.        Behavior: Behavior normal.      ASSESSMENT & PLAN: A total of 47 minutes was spent with the  patient and counseling/coordination of care regarding preparing for this visit, review of most recent office visit notes, review of multiple chronic medical problems and their management, review of all medications, prognosis, need for blood work today, education on nutrition, documentation and need for follow-up.  Problem List Items Addressed This Visit       Cardiovascular and Mediastinum   Atrial fibrillation (HCC)    Well-controlled.  Sinus rhythm today.  Continue propranolol 40 mg twice a day.      Relevant Orders   TSH   Hypertension    Well-controlled hypertension.  Continue losartan 25 mg daily.        Digestive   Hepatic cirrhosis (HCC) - Primary    Stable.  Hepatitis C titers done today.      Relevant Orders   HCV Ab w Reflex to Quant PCR   Comprehensive metabolic panel   CBC with Differential/Platelet   TSH   Gastroesophageal reflux disease without esophagitis    Asymptomatic.  Takes omeprazole 20 mg as needed.        Other   Arthralgia of multiple sites    Diclofenac working very well.  Advised about risks of chronic NSAID use.  Advised to stay well-hydrated.  Blood work done today.      Other Visit Diagnoses     Vitreous floaters of right eye       Relevant Orders   Ambulatory referral to Ophthalmology   Essential hypertension       Relevant Orders   Comprehensive metabolic panel   CBC with Differential/Platelet   Lipid panel   TSH      Patient Instructions  Cirrhosis  Cirrhosis is long-term (chronic) liver injury. The liver is the body's largest internal organ, and it performs many functions. It converts food into energy, removes toxic material from the blood, makes important proteins, and absorbs necessary vitamins from food. In cirrhosis, healthy liver cells are replaced by scar tissue. This  prevents blood from flowing through the liver and makes it difficult for the liver to complete its functions. What are the causes? Common causes of this  condition are hepatitis C and long-term alcohol abuse. Other causes include: Nonalcoholic fatty liver disease (NAFLD). This happens when fat is deposited in the liver by causes other than alcohol. Hepatitis B infection. Autoimmune hepatitis. In this condition, the body's defense system (immune system) mistakenly attacks the liver cells, causing inflammation. Diseases that cause blockage of ducts inside the liver. Inherited liver diseases, such as hemochromatosis. This is one of the most common inherited liver diseases. In this disease, deposits of iron collect in the liver and other organs. Reactions to certain long-term medicines, such as amiodarone, a heart medicine. Parasitic infections. These include schistosomiasis, which is caused by a flatworm. Long-term contact to certain toxins. These toxins include certain organic solvents, such as toluene and chloroform. What increases the risk? You are more likely to develop this condition if: You have certain types of viral hepatitis. You abuse alcohol, especially if you are female. You are overweight. You use IV drugs and share needles. You have unprotected sex with someone who has viral hepatitis. What are the signs or symptoms? You may not have any signs and symptoms at first. Symptoms may not develop until the damage to your liver starts to get worse. Early symptoms may include: Weakness and tiredness (fatigue). Changes in sleep patterns or having trouble sleeping. Itchiness. Tenderness in the right-upper part of your abdomen. Weight loss and muscle loss. Nausea. Loss of appetite. Later symptoms may include: Fatigue or weakness that is getting worse. Yellow skin and eyes (jaundice). Buildup of fluid in the abdomen (ascites). You may notice that your clothes are tight around your waist. Weight gain and swelling of the feet and ankles (edema). Trouble breathing. Easy bruising and bleeding. Vomiting blood, or black or bloody  stool. Mental confusion. How is this diagnosed? Your health care provider may suspect cirrhosis based on your symptoms and medical history, especially if you have other medical conditions or a history of alcohol abuse. Your health care provider will do a physical exam to feel your liver and to check for signs of cirrhosis. Tests may include: Blood tests to check: For hepatitis B or C. Kidney function. Liver function. Imaging tests such as: MRI or CT scan to look for changes seen in advanced cirrhosis. Ultrasound to see if normal liver tissue is being replaced by scar tissue. A procedure in which a long needle is used to take a sample of liver tissue to be checked in a lab (biopsy). Liver biopsy can confirm the diagnosis of cirrhosis. How is this treated? Treatment for this condition depends on how damaged your liver is and what caused the damage. It may include treating the symptoms of cirrhosis, or treating the underlying causes to slow the damage. Treatment may include: Making lifestyle changes, such as: Eating a healthy diet. You may need to work with your health care provider or a dietitian to develop an eating plan. Restricting salt intake. Maintaining a healthy weight. Not abusing drugs or alcohol. Taking medicines to: Treat liver infections or other infections. Control itching. Reduce fluid buildup. Reduce certain blood toxins. Reduce risk of bleeding from enlarged blood vessels in the stomach or esophagus (varices). Liver transplant. In this procedure, a liver from a donor is used to replace your diseased liver. This is done if cirrhosis has caused liver failure. Other treatments and procedures may be done depending on the  problems that you get from cirrhosis. Common problems include liver-related kidney failure (hepatorenal syndrome). Follow these instructions at home:  Take medicines only as told by your health care provider. Do not use medicines that are toxic to your liver.  Ask your health care provider before taking any new medicines, including over-the-counter medicines such as NSAIDs. Rest as needed. Eat a well-balanced diet. Limit your salt or water intake, if your health care provider asks you to do this. Do not drink alcohol. This is especially important if you routinely take acetaminophen. Keep all follow-up visits. This is important. Contact a health care provider if you: Have fatigue or weakness that is getting worse. Develop swelling of the hands, feet, or legs, or a buildup of fluid in the abdomen (ascites). Have a fever or chills. Develop loss of appetite. Have nausea or vomiting. Develop jaundice. Develop easy bruising or bleeding. Get help right away if you: Vomit bright red blood or a material that looks like coffee grounds. Have blood in your stools. Notice that your stools appear black and tarry. Become confused. Have chest pain or trouble breathing. These symptoms may represent a serious problem that is an emergency. Do not wait to see if the symptoms will go away. Get medical help right away. Call your local emergency services (911 in the [place] Do not drive yourself to the hospital. Summary Cirrhosis is chronic liver injury. Common causes are hepatitis C and long-term alcohol abuse. Tests used to diagnose cirrhosis include blood tests, imaging tests, and liver biopsy. Treatment for this condition involves treating the underlying cause. Avoid alcohol, drugs, salt, and medicines that may damage your liver. Get help right away if you vomit bright red blood or a material that looks like coffee grounds. This information is not intended to replace advice given to you by your health care provider. Make sure you discuss any questions you have with your health care provider. Document Revised: 11/29/2019 Document Reviewed: 11/29/2019 Elsevier Patient Education  2023 Elsevier Inc.    Edwina Barth, MD  Primary [facility] at [facility]

## 2021-08-06 NOTE — Patient Instructions (Signed)
Cirrhosis  Cirrhosis is long-term (chronic) liver injury. The liver is the body's largest internal organ, and it performs many functions. It converts food into energy, removes toxic material from the blood, makes important proteins, and absorbs necessary vitamins from food. In cirrhosis, healthy liver cells are replaced by scar tissue. This prevents blood from flowing through the liver and makes it difficult for the liver to complete its functions. What are the causes? Common causes of this condition are hepatitis C and long-term alcohol abuse. Other causes include: Nonalcoholic fatty liver disease (NAFLD). This happens when fat is deposited in the liver by causes other than alcohol. Hepatitis B infection. Autoimmune hepatitis. In this condition, the body's defense system (immune system) mistakenly attacks the liver cells, causing inflammation. Diseases that cause blockage of ducts inside the liver. Inherited liver diseases, such as hemochromatosis. This is one of the most common inherited liver diseases. In this disease, deposits of iron collect in the liver and other organs. Reactions to certain long-term medicines, such as amiodarone, a heart medicine. Parasitic infections. These include schistosomiasis, which is caused by a flatworm. Long-term contact to certain toxins. These toxins include certain organic solvents, such as toluene and chloroform. What increases the risk? You are more likely to develop this condition if: You have certain types of viral hepatitis. You abuse alcohol, especially if you are female. You are overweight. You use IV drugs and share needles. You have unprotected sex with someone who has viral hepatitis. What are the signs or symptoms? You may not have any signs and symptoms at first. Symptoms may not develop until the damage to your liver starts to get worse. Early symptoms may include: Weakness and tiredness (fatigue). Changes in sleep patterns or having trouble  sleeping. Itchiness. Tenderness in the right-upper part of your abdomen. Weight loss and muscle loss. Nausea. Loss of appetite. Later symptoms may include: Fatigue or weakness that is getting worse. Yellow skin and eyes (jaundice). Buildup of fluid in the abdomen (ascites). You may notice that your clothes are tight around your waist. Weight gain and swelling of the feet and ankles (edema). Trouble breathing. Easy bruising and bleeding. Vomiting blood, or black or bloody stool. Mental confusion. How is this diagnosed? Your health care provider may suspect cirrhosis based on your symptoms and medical history, especially if you have other medical conditions or a history of alcohol abuse. Your health care provider will do a physical exam to feel your liver and to check for signs of cirrhosis. Tests may include: Blood tests to check: For hepatitis B or C. Kidney function. Liver function. Imaging tests such as: MRI or CT scan to look for changes seen in advanced cirrhosis. Ultrasound to see if normal liver tissue is being replaced by scar tissue. A procedure in which a long needle is used to take a sample of liver tissue to be checked in a lab (biopsy). Liver biopsy can confirm the diagnosis of cirrhosis. How is this treated? Treatment for this condition depends on how damaged your liver is and what caused the damage. It may include treating the symptoms of cirrhosis, or treating the underlying causes to slow the damage. Treatment may include: Making lifestyle changes, such as: Eating a healthy diet. You may need to work with your health care provider or a dietitian to develop an eating plan. Restricting salt intake. Maintaining a healthy weight. Not abusing drugs or alcohol. Taking medicines to: Treat liver infections or other infections. Control itching. Reduce fluid buildup. Reduce certain blood   toxins. Reduce risk of bleeding from enlarged blood vessels in the stomach or esophagus  (varices). Liver transplant. In this procedure, a liver from a donor is used to replace your diseased liver. This is done if cirrhosis has caused liver failure. Other treatments and procedures may be done depending on the problems that you get from cirrhosis. Common problems include liver-related kidney failure (hepatorenal syndrome). Follow these instructions at home:  Take medicines only as told by your health care provider. Do not use medicines that are toxic to your liver. Ask your health care provider before taking any new medicines, including over-the-counter medicines such as NSAIDs. Rest as needed. Eat a well-balanced diet. Limit your salt or water intake, if your health care provider asks you to do this. Do not drink alcohol. This is especially important if you routinely take acetaminophen. Keep all follow-up visits. This is important. Contact a health care provider if you: Have fatigue or weakness that is getting worse. Develop swelling of the hands, feet, or legs, or a buildup of fluid in the abdomen (ascites). Have a fever or chills. Develop loss of appetite. Have nausea or vomiting. Develop jaundice. Develop easy bruising or bleeding. Get help right away if you: Vomit bright red blood or a material that looks like coffee grounds. Have blood in your stools. Notice that your stools appear black and tarry. Become confused. Have chest pain or trouble breathing. These symptoms may represent a serious problem that is an emergency. Do not wait to see if the symptoms will go away. Get medical help right away. Call your local emergency services (911 in the [place] Do not drive yourself to the hospital. Summary Cirrhosis is chronic liver injury. Common causes are hepatitis C and long-term alcohol abuse. Tests used to diagnose cirrhosis include blood tests, imaging tests, and liver biopsy. Treatment for this condition involves treating the underlying cause. Avoid alcohol, drugs, salt,  and medicines that may damage your liver. Get help right away if you vomit bright red blood or a material that looks like coffee grounds. This information is not intended to replace advice given to you by your health care provider. Make sure you discuss any questions you have with your health care provider. Document Revised: 11/29/2019 Document Reviewed: 11/29/2019 Elsevier Patient Education  2023 Elsevier Inc.  

## 2021-08-06 NOTE — Assessment & Plan Note (Signed)
Well-controlled hypertension.  Continue losartan 25 mg daily. °

## 2021-08-06 NOTE — Assessment & Plan Note (Signed)
Diclofenac working very well.  Advised about risks of chronic NSAID use.  Advised to stay well-hydrated.  Blood work done today.

## 2021-08-06 NOTE — Assessment & Plan Note (Signed)
Well-controlled.  Sinus rhythm today.  Continue propranolol 40 mg twice a day.

## 2021-08-06 NOTE — Assessment & Plan Note (Signed)
Stable.  Hepatitis C titers done today.

## 2021-08-07 ENCOUNTER — Encounter: Payer: Self-pay | Admitting: Emergency Medicine

## 2021-08-11 ENCOUNTER — Encounter: Payer: Self-pay | Admitting: Emergency Medicine

## 2021-08-11 LAB — HCV AB W REFLEX TO QUANT PCR: HCV Ab: REACTIVE — AB

## 2021-08-11 LAB — HCV RT-PCR, QUANT (NON-GRAPH): Hepatitis C Quantitation: NOT DETECTED IU/mL

## 2021-08-25 ENCOUNTER — Ambulatory Visit (INDEPENDENT_AMBULATORY_CARE_PROVIDER_SITE_OTHER): Payer: Medicare Other

## 2021-08-25 DIAGNOSIS — Z0000 Encounter for general adult medical examination without abnormal findings: Secondary | ICD-10-CM | POA: Diagnosis not present

## 2021-08-25 NOTE — Progress Notes (Signed)
I connected with Kinjal Sonner today by telephone and verified that I am speaking with the correct person using two identifiers. Location patient: home Location provider: work Persons participating in the virtual visit: patient, provider.   I discussed the limitations, risks, security and privacy concerns of performing an evaluation and management service by telephone and the availability of in person appointments. I also discussed with the patient that there may be a patient responsible charge related to this service. The patient expressed understanding and verbally consented to this telephonic visit.    Interactive audio and video telecommunications were attempted between this provider and patient, however failed, due to patient having technical difficulties OR patient did not have access to video capability.  We continued and completed visit with audio only.  Some vital signs may be absent or patient reported.   Time Spent with patient on telephone encounter: 30 minutes  Subjective:   Emily Butler is a 66 y.o. female who presents for Medicare Annual (Subsequent) preventive examination.  Review of Systems     Cardiac Risk Factors include: advanced age (>11men, >28 women);dyslipidemia;family history of premature cardiovascular disease;obesity (BMI >30kg/m2)     Objective:    There were no vitals filed for this visit. There is no height or weight on file to calculate BMI.     08/25/2021    3:06 PM 05/17/2021    1:31 PM 01/31/2020    3:42 PM 12/21/2018    2:30 PM 06/28/2018    5:51 PM  Advanced Directives  Does Patient Have a Medical Advance Directive? No No No No No  Would patient like information on creating a medical advance directive? No - Patient declined  Yes (MAU/Ambulatory/Procedural Areas - Information given) Yes (MAU/Ambulatory/Procedural Areas - Information given)     Current Medications (verified) Outpatient Encounter Medications as of 08/25/2021  Medication Sig    diclofenac (VOLTAREN) 75 MG EC tablet Take 1 tablet (75 mg total) by mouth 2 (two) times daily as needed. Do not take every day.   furosemide (LASIX) 40 MG tablet Take 1 tablet (40 mg total) by mouth daily.   loperamide (IMODIUM A-D) 2 MG tablet Take 1 tablet (2 mg total) by mouth 4 (four) times daily as needed for diarrhea or loose stools.   losartan (COZAAR) 25 MG tablet TAKE 1 TABLET (25 MG TOTAL) BY MOUTH DAILY.   Multiple Vitamin (MULTI-VITAMIN) tablet Take by mouth.   omeprazole (PRILOSEC) 20 MG capsule TAKE 1 CAPSULE BY MOUTH EVERY DAY   potassium chloride SA (KLOR-CON M) 20 MEQ tablet Take 1 tablet (20 mEq total) by mouth daily. Take with lasix   propranolol (INDERAL) 40 MG tablet Take 1 tablet (40 mg total) by mouth 2 (two) times daily.   sertraline (ZOLOFT) 50 MG tablet Take 1 tablet (50 mg total) by mouth daily.   No facility-administered encounter medications on file as of 08/25/2021.    Allergies (verified) Duloxetine and Codeine   History: Past Medical History:  Diagnosis Date   Alcoholic ([place])    Cirrhosis of liver ([place])    Depression    EXCESSIVE MENSTRUATION 11/08/2006   Qualifier: Diagnosis of  By: Maxie Better FNP, Rosalita Levan    GERD (gastroesophageal reflux disease)    Hepatitis C 2011   Hypertension    Past Surgical History:  Procedure Laterality Date   CESAREAN SECTION     Family History  Problem Relation Age of Onset   Alcohol abuse Mother    Drug abuse Mother  Heart disease Father    Social History   Socioeconomic History   Marital status: Divorced    Spouse name: Not on file   Number of children: 4   Years of education: Not on file   Highest education level: Not on file  Occupational History   Occupation: disability  Tobacco Use   Smoking status: Former   Smokeless tobacco: Never  Building services engineer Use: Never used  Substance and Sexual Activity   Alcohol use: Not Currently    Comment: Alcoholic quit 10 years ago   Drug use: Never    Sexual activity: Not Currently  Other Topics Concern   Not on file  Social History Narrative   Not on file   Social Determinants of Health   Financial Resource Strain: Low Risk  (08/25/2021)   Overall Financial Resource Strain (CARDIA)    Difficulty of Paying Living Expenses: Not hard at all  Food Insecurity: No Food Insecurity (08/25/2021)   Hunger Vital Sign    Worried About Running Out of Food in the Last Year: Never true    Ran Out of Food in the Last Year: Never true  Transportation Needs: No Transportation Needs (08/25/2021)   PRAPARE - Administrator, Civil Service (Medical): No    Lack of Transportation (Non-Medical): No  Physical Activity: Sufficiently Active (08/25/2021)   Exercise Vital Sign    Days of Exercise per Week: 7 days    Minutes of Exercise per Session: 30 min  Stress: No Stress Concern Present (08/25/2021)   [employer] of Occupational Health - Occupational Stress Questionnaire    Feeling of Stress : Not at all  Social Connections: Socially Isolated (08/25/2021)   Social Connection and Isolation Panel [NHANES]    Frequency of Communication with Friends and Family: More than three times a week    Frequency of Social Gatherings with Friends and Family: More than three times a week    Attends Religious Services: Never    Database administrator or Organizations: No    Attends Engineer, structural: Never    Marital Status: Divorced    Tobacco Counseling Counseling given: Not Answered   Clinical Intake:  Pre-visit preparation completed: Yes  Pain : No/denies pain     BMI - recorded: 32.78 Nutritional Status: BMI > 30  Obese Nutritional Risks: None Diabetes: No  How often do you need to have someone help you when you read instructions, pamphlets, or other written materials from your doctor or pharmacy?: 1 - Never What is the last grade level you completed in school?: 11th grade  Diabetic? no  Interpreter Needed?:  No  Information entered by :: Susie Cassette, LPN.   Activities of Daily Living    08/25/2021    3:12 PM  In your present state of health, do you have any difficulty performing the following activities:  Hearing? 0  Vision? 0  Difficulty concentrating or making decisions? 0  Walking or climbing stairs? 0  Dressing or bathing? 0  Doing errands, shopping? 0  Preparing Food and eating ? N  Using the Toilet? N  In the past six months, have you accidently leaked urine? N  Do you have problems with loss of bowel control? N  Managing your Medications? N  Managing your Finances? N  Housekeeping or managing your Housekeeping? N    Patient Care Team: Georgina Quint, MD as PCP - General (Internal Medicine) Diona Foley, MD as Consulting Physician (  Ophthalmology)  Indicate any recent Medical Services you may have received from other than [facility] providers in the past year (date may be approximate).     Assessment:   This is a routine wellness examination for [place].  Hearing/Vision screen No results found.  Dietary issues and exercise activities discussed: Current Exercise Habits: Home exercise routine, Type of exercise: walking, Time (Minutes): 30, Frequency (Times/Week): 7, Weekly Exercise (Minutes/Week): 210, Intensity: Moderate, Exercise limited by: cardiac condition(s)   Goals Addressed   None   Depression Screen    08/25/2021    3:28 PM 08/06/2021    2:55 PM 04/23/2021    3:01 PM 01/13/2021    2:14 PM 09/25/2020    3:47 PM 08/04/2020    3:10 PM 07/15/2020   10:19 AM  PHQ 2/9 Scores  PHQ - 2 Score 1 1    0   PHQ- 9 Score 3 3          Information is confidential and restricted. Go to Review Flowsheets to unlock data.    Fall Risk    08/25/2021    3:07 PM 08/06/2021    2:55 PM 08/04/2020    3:09 PM 01/31/2020    3:52 PM 08/27/2019   10:10 AM  Fall Risk   Falls in the past year? 1 1 1  0 1  Number falls in past yr: 1 1 0 0 0  Injury with Fall? 1 1 0 0 0  Risk for  fall due to : History of fall(s);Orthopedic patient   No Fall Risks   Follow up Falls evaluation completed  Falls evaluation completed Falls prevention discussed     FALL RISK PREVENTION PERTAINING TO THE HOME:  Any stairs in or around the home? No  If so, are there any without handrails? No  Home free of loose throw rugs in walkways, pet beds, electrical cords, etc? Yes  Adequate lighting in your home to reduce risk of falls? Yes   ASSISTIVE DEVICES UTILIZED TO PREVENT FALLS:  Life alert? No  Use of a cane, walker or w/c? No  Grab bars in the bathroom? No  Shower chair or bench in shower? No  Elevated toilet seat or a handicapped toilet? No   TIMED UP AND GO:  Was the test performed? No .  Length of time to ambulate 10 feet: n/a sec.   Appearance of gait: Patient not evaluated for gait during this visit.  Cognitive Function:        08/25/2021    3:21 PM  6CIT Screen  What Year? 0 points  What month? 0 points  What time? 0 points  Count back from 20 0 points  Months in reverse 0 points  Repeat phrase 0 points  Total Score 0 points    Immunizations Immunization History  Administered Date(s) Administered   Hep A / Hep B 07/16/2011, 08/16/2011, 01/17/2012   Hepatitis B 08/10/2012, 10/04/2012   Hepatitis B, adult 02/14/2013   Influenza Inj Mdck Quad Pf 12/02/2016, 11/24/2017   Influenza, Seasonal, Injecte, Preservative Fre 11/14/2013   Influenza,inj,Quad PF,6+ Mos 12/24/2015, 02/03/2021   Influenza-Unspecified 11/24/2012   Pneumococcal Conjugate-13 11/06/2014   Pneumococcal Polysaccharide-23 08/10/2012, 08/04/2020   Tdap 08/10/2012   Zoster Recombinat (Shingrix) 02/01/2019    TDAP status: Up to date  Flu Vaccine status: Up to date  Pneumococcal vaccine status: Up to date  Covid-19 vaccine status: Declined, Education has been provided regarding the importance of this vaccine but patient still declined. Advised may receive this  vaccine at local pharmacy or  Health Dept.or vaccine clinic. Aware to provide a copy of the vaccination record if obtained from local pharmacy or Health Dept. Verbalized acceptance and understanding.  Qualifies for Shingles Vaccine? Yes   Zostavax completed No   Shingrix Completed?: Yes  Screening Tests Health Maintenance  Topic Date Due   COVID-19 Vaccine (1) Never done   PAP SMEAR-Modifier  Never done   Zoster Vaccines- Shingrix (2 of 2) 11/06/2021 (Originally 03/29/2019)   MAMMOGRAM  08/07/2022 (Originally 11/20/2008)   INFLUENZA VACCINE  09/29/2021   TETANUS/TDAP  08/11/2022   COLONOSCOPY (Pts 45-94yrs Insurance coverage will need to be confirmed)  12/04/2023   Hepatitis C Screening  Completed   HIV Screening  Completed   HPV VACCINES  Aged Out    Health Maintenance  Health Maintenance Due  Topic Date Due   COVID-19 Vaccine (1) Never done   PAP SMEAR-Modifier  Never done    Colorectal cancer screening: Type of screening: Colonoscopy. Completed 12/03/2013. Repeat every 10 years  Mammogram status: Completed 11/21/2006. Repeat every year  Bone Density status: never done  Lung Cancer Screening: (Low Dose CT Chest recommended if Age 74-80 years, 30 pack-year currently smoking OR have quit w/in 15years.) does not qualify.   Lung Cancer Screening Referral: no  Additional Screening:  Hepatitis C Screening: does qualify; Completed 08/06/2021  Vision Screening: Recommended annual ophthalmology exams for early detection of glaucoma and other disorders of the eye. Is the patient up to date with their annual eye exam?  Yes  Who is the provider or what is the name of the office in which the patient attends annual eye exams? Sharyn Dross, MD If pt is not established with a provider, would they like to be referred to a provider to establish care? No .   Dental Screening: Recommended annual dental exams for proper oral hygiene  Community Resource Referral / Chronic Care Management: CRR required this visit?  No    CCM required this visit?  No      Plan:     I have personally reviewed and noted the following in the patient's chart:   Medical and social history Use of alcohol, tobacco or illicit drugs  Current medications and supplements including opioid prescriptions.  Functional ability and status Nutritional status Physical activity Advanced directives List of other physicians Hospitalizations, surgeries, and ER visits in previous 12 months Vitals Screenings to include cognitive, depression, and falls Referrals and appointments  In addition, I have reviewed and discussed with patient certain preventive protocols, quality metrics, and best practice recommendations. A written personalized care plan for preventive services as well as general preventive health recommendations were provided to patient.     Mickeal Needy, LPN   [id number]/44/0347   Nurse Notes:  Patient is cogitatively intact. There were no vitals filed for this visit. There is no height or weight on file to calculate BMI.

## 2021-08-28 ENCOUNTER — Other Ambulatory Visit: Payer: Self-pay | Admitting: Emergency Medicine

## 2021-08-28 DIAGNOSIS — I10 Essential (primary) hypertension: Secondary | ICD-10-CM

## 2021-08-28 DIAGNOSIS — Z760 Encounter for issue of repeat prescription: Secondary | ICD-10-CM

## 2021-09-02 ENCOUNTER — Other Ambulatory Visit: Payer: Self-pay | Admitting: Emergency Medicine

## 2021-09-02 DIAGNOSIS — K219 Gastro-esophageal reflux disease without esophagitis: Secondary | ICD-10-CM

## 2021-09-02 DIAGNOSIS — Z760 Encounter for issue of repeat prescription: Secondary | ICD-10-CM

## 2021-09-02 DIAGNOSIS — M2550 Pain in unspecified joint: Secondary | ICD-10-CM

## 2021-09-10 ENCOUNTER — Other Ambulatory Visit: Payer: Self-pay | Admitting: Emergency Medicine

## 2021-09-10 DIAGNOSIS — I4891 Unspecified atrial fibrillation: Secondary | ICD-10-CM

## 2021-09-10 DIAGNOSIS — I10 Essential (primary) hypertension: Secondary | ICD-10-CM

## 2021-09-10 DIAGNOSIS — Z760 Encounter for issue of repeat prescription: Secondary | ICD-10-CM

## 2021-09-16 ENCOUNTER — Encounter: Payer: Self-pay | Admitting: Psychiatry

## 2021-09-16 ENCOUNTER — Telehealth (INDEPENDENT_AMBULATORY_CARE_PROVIDER_SITE_OTHER): Payer: Medicare Other | Admitting: Psychiatry

## 2021-09-16 ENCOUNTER — Other Ambulatory Visit: Payer: Self-pay | Admitting: Psychiatry

## 2021-09-16 DIAGNOSIS — F3342 Major depressive disorder, recurrent, in full remission: Secondary | ICD-10-CM

## 2021-09-16 DIAGNOSIS — G4700 Insomnia, unspecified: Secondary | ICD-10-CM | POA: Diagnosis not present

## 2021-09-16 DIAGNOSIS — F1021 Alcohol dependence, in remission: Secondary | ICD-10-CM | POA: Diagnosis not present

## 2021-09-16 MED ORDER — SERTRALINE HCL 50 MG PO TABS: 50.0000 mg | ORAL_TABLET | Freq: Every day | ORAL | 1 refills | Status: DC

## 2021-09-16 MED ORDER — RAMELTEON 8 MG PO TABS: 8.0000 mg | ORAL_TABLET | Freq: Every day | ORAL | 1 refills | Status: DC

## 2021-09-16 NOTE — Patient Instructions (Signed)
Ramelteon Tablets What is this medication? RAMELTEON (ram EL tee on) treats insomnia. It helps you go to sleep faster. This medicine may be used for other purposes; ask your health care provider or pharmacist if you have questions. COMMON BRAND NAME(S): Rozerem What should I tell my care team before I take this medication? They need to know if you have any of these conditions: Liver disease Lung or breathing disease, such as asthma or COPD Mental health condition Substance use disorder Sleep apnea Suicidal thoughts, plans, or attempt by you or a family member An unusual or allergic reaction to ramelteon, other medications, foods, dyes, or preservatives Pregnant or trying to get pregnant Breast-feeding How should I use this medication? Take this medication by mouth with water. Take it as directed on the prescription label and only when you are ready for bed. Do not cut or break this medication. Swallow the tablets whole. Do not take it with or right after a meal. A special MedGuide will be given to you by the pharmacist with each prescription and refill. Be sure to read this information carefully each time. Talk to your care team about the use of this medication in children. It is not approved for use in children. Overdosage: If you think you have taken too much of this medicine contact a poison control center or emergency room at once. NOTE: This medicine is only for you. Do not share this medicine with others. What if I miss a dose? This does not apply. This medication should only be taken immediately before going to sleep. Do not take double or extra doses. What may interact with this medication? Do not take this medication with any of the following: Fluvoxamine Melatonin Tasimelteon Viloxazine This medication may also interact with the following: Alcohol Certain medications for fungal infections like ketoconazole, fluconazole, or  itraconazole Ciprofloxacin Donepezil Doxepin Other medications for sleep Rifampin This list may not describe all possible interactions. Give your health care provider a list of all the medicines, herbs, non-prescription drugs, or dietary supplements you use. Also tell them if you smoke, drink alcohol, or use illegal drugs. Some items may interact with your medicine. What should I watch for while using this medication? Visit your care team for regular checks on your progress. Tell your care team if your symptoms do not start to get better or if they get worse. Plan to go to bed and stay in bed for a full night (7 to 8 hours) after you take this medication. You may still be drowsy the morning after taking this medication. This medication may affect your coordination, reaction time, or judgment. Do not drive or operate machinery until you know how this medication affects you. Sit up or stand slowly to reduce the risk of dizzy or fainting spells. You may do unusual sleep behaviors or activities you do not remember the day after taking this medication. Activities include driving, making or eating food, talking on the phone, sexual activity, or sleep walking. Stop taking this medication and call your care team right away if you find out you have done activities like this. If you or your family notice any changes in your behavior, such as new or worsening depression, thoughts of harming yourself, anxiety, other unusual or disturbing thoughts, or memory loss, call your care team right away. After you stop taking this medication, you may have trouble falling asleep. This is called rebound insomnia. This problem usually goes away on its own after 1 or 2 nights. What   side effects may I notice from receiving this medication? Side effects that you should report to your care team as soon as possible: Allergic reactions or angioedema--skin rash, itching, hives, swelling of the face, lips, tongue, arms, or legs,  trouble swallowing or breathing High prolactin level--unexpected breast tissue growth, discharge from the nipple, change in sex drive or performance, irregular menstrual cycle Mood and behavior changes--anxiety, nervousness, confusion, hallucinations, irritability, hostility, thoughts of suicide or self-harm, worsening mood, feelings of depression Unusual sleep behaviors or activities you do not remember such as driving, eating, or sexual activity Side effects that usually do not require medical attention (report to your care team if they continue or are bothersome): Dizziness Drowsiness the day after use Fatigue This list may not describe all possible side effects. Call your doctor for medical advice about side effects. You may report side effects to FDA at 1-800-FDA-1088. Where should I keep my medication? Keep out of the reach of children and pets. Store at room temperature between 15 and 30 degrees C (59 and 86 degrees F). Protect from light and moisture. Keep the container tightly closed. Get rid of any unused medication after the expiration date. To get rid of medications that are no longer needed or have expired: Take the medication to a medication take-back program. Check with your pharmacy or law enforcement to find a location. If you cannot return the medication, check the label or package insert to see if the medication should be thrown out in the garbage or flushed down the toilet. If you are not sure, ask your care team. If it is safe to put it in the trash, take the medication out of the container. Mix the medication with cat litter, dirt, coffee grounds, or other unwanted substance. Seal the mixture in a bag or container. Put it in the trash. NOTE: This sheet is a summary. It may not cover all possible information. If you have questions about this medicine, talk to your doctor, pharmacist, or health care provider.  2023 Elsevier/Gold Standard (2020-09-22 00:00:00)  

## 2021-09-16 NOTE — Progress Notes (Signed)
Virtual Visit via Telephone Note  I connected with Emily Butler on 09/16/21 at  2:00 PM EDT by telephone and verified that I am speaking with the correct person using two identifiers.  Location Provider Location : [facility] Patient Location : Home  Participants: Patient , Provider   I discussed the limitations, risks, security and privacy concerns of performing an evaluation and management service by telephone and the availability of in person appointments. I also discussed with the patient that there may be a patient responsible charge related to this service. The patient expressed understanding and agreed to proceed.    I discussed the assessment and treatment plan with the patient. The patient was provided an opportunity to ask questions and all were answered. The patient agreed with the plan and demonstrated an understanding of the instructions.   The patient was advised to call back or seek an in-person evaluation if the symptoms worsen or if the condition fails to improve as anticipated.   [facility] MD OP Progress Note  09/16/2021 2:55 PM Emily Butler  MRN:  [id number]  Chief Complaint:  Chief Complaint  Patient presents with   Follow-up: 67 year old female, has a history of depression, alcoholism, currently struggling with sleep issues presented for medication management.   HPI: Emily Butler is a 66 year old female, retired, disabled, lives in [place], has a history of MDD, alcohol use disorder in remission, history of cirrhosis of liver without ascites, essential hypertension, gastroesophageal reflux disease without esophagitis, atrial fibrillation was evaluated by phone today.  Patient today reports she is currently struggling with sleep issues.  She reports she does not have a good sleep hygiene, can fall asleep at around 7 AM and then wake up at around 11 AM.  Takes naps during the day as needed.  Patient reports since she is not sleeping well she has noticed she has had  irritability on and off especially when her dogs does not behave.  She has tried and failed medications like trazodone, Ambien.  Ambien caused her to have side effects, dissociation.  Denies suicidality, homicidality or perceptual disturbances.  Currently compliant on the sertraline.  Denies side effects.  Patient denies any other concerns today.  Visit Diagnosis:    ICD-10-CM   1. MDD (major depressive disorder), recurrent, in full remission (HCC)  F33.42 sertraline (ZOLOFT) 50 MG tablet    2. Alcohol use disorder, moderate, in sustained remission (HCC)  F10.21     3. Insomnia, unspecified type  G47.00 ramelteon (ROZEREM) 8 MG tablet      Past Psychiatric History: Reviewed past psychiatric history from progress note on 12/11/2018.  Past Medical History:  Past Medical History:  Diagnosis Date   Alcoholic (HCC)    Cirrhosis of liver (HCC)    Depression    EXCESSIVE MENSTRUATION 11/08/2006   Qualifier: Diagnosis of  By: Jillyn Hidden FNP, Mcarthur Rossetti    GERD (gastroesophageal reflux disease)    Hepatitis C 2011   Hypertension     Past Surgical History:  Procedure Laterality Date   CESAREAN SECTION      Family Psychiatric History: Reviewed family psychiatric history from progress note on 12/11/2018.  Family History:  Family History  Problem Relation Age of Onset   Alcohol abuse Mother    Drug abuse Mother    Heart disease Father     Social History: Reviewed social history from progress note on 12/11/2018. Social History   Socioeconomic History   Marital status: Divorced    Spouse name: Not  on file   Number of children: 4   Years of education: Not on file   Highest education level: Not on file  Occupational History   Occupation: disability  Tobacco Use   Smoking status: Former   Smokeless tobacco: Never  Vaping Use   Vaping Use: Never used  Substance and Sexual Activity   Alcohol use: Not Currently    Comment: Alcoholic quit 10 years ago   Drug use: Never    Sexual activity: Not Currently  Other Topics Concern   Not on file  Social History Narrative   Not on file   Social Determinants of Health   Financial Resource Strain: Low Risk  (08/25/2021)   Overall Financial Resource Strain (CARDIA)    Difficulty of Paying Living Expenses: Not hard at all  Food Insecurity: No Food Insecurity (08/25/2021)   Hunger Vital Sign    Worried About Running Out of Food in the Last Year: Never true    Ran Out of Food in the Last Year: Never true  Transportation Needs: No Transportation Needs (08/25/2021)   PRAPARE - Administrator, Civil Service (Medical): No    Lack of Transportation (Non-Medical): No  Physical Activity: Sufficiently Active (08/25/2021)   Exercise Vital Sign    Days of Exercise per Week: 7 days    Minutes of Exercise per Session: 30 min  Stress: No Stress Concern Present (08/25/2021)   [employer] of Occupational Health - Occupational Stress Questionnaire    Feeling of Stress : Not at all  Social Connections: Socially Isolated (08/25/2021)   Social Connection and Isolation Panel [NHANES]    Frequency of Communication with Friends and Family: More than three times a week    Frequency of Social Gatherings with Friends and Family: More than three times a week    Attends Religious Services: Never    Database administrator or Organizations: No    Attends Banker Meetings: Never    Marital Status: Divorced    Allergies:  Allergies  Allergen Reactions   Duloxetine Other (See Comments) and Nausea Only   Codeine     REACTION: GI upset    Metabolic Disorder Labs: Lab Results  Component Value Date   HGBA1C 5.3 08/17/2018   MPG 105 08/17/2018   No results found for: "PROLACTIN" Lab Results  Component Value Date   CHOL 221 (H) 08/06/2021   TRIG 134.0 08/06/2021   HDL 56.50 08/06/2021   CHOLHDL 4 08/06/2021   VLDL 26.8 08/06/2021   LDLCALC 138 (H) 08/06/2021   LDLCALC 145 (H) 08/17/2019   Lab  Results  Component Value Date   TSH 2.12 08/06/2021    Therapeutic Level Labs: No results found for: "LITHIUM" No results found for: "VALPROATE" No results found for: "CBMZ"  Current Medications: Current Outpatient Medications  Medication Sig Dispense Refill   diclofenac (VOLTAREN) 75 MG EC tablet TAKE 1 TABLET (75 MG TOTAL) BY MOUTH 2 (TWO) TIMES DAILY AS NEEDED. DO NOT TAKE EVERY DAY. 30 tablet 0   furosemide (LASIX) 40 MG tablet Take 1 tablet (40 mg total) by mouth daily. 90 tablet 3   loperamide (IMODIUM A-D) 2 MG tablet Take 1 tablet (2 mg total) by mouth 4 (four) times daily as needed for diarrhea or loose stools. 30 tablet 1   losartan (COZAAR) 25 MG tablet TAKE 1 TABLET (25 MG TOTAL) BY MOUTH DAILY. 90 tablet 1   omeprazole (PRILOSEC) 20 MG capsule TAKE 1 CAPSULE BY MOUTH  EVERY DAY 90 capsule 1   potassium chloride SA (KLOR-CON M) 20 MEQ tablet Take 1 tablet (20 mEq total) by mouth daily. Take with lasix 90 tablet 3   propranolol (INDERAL) 40 MG tablet TAKE 1 TABLET BY MOUTH TWICE A DAY 180 tablet 1   ramelteon (ROZEREM) 8 MG tablet Take 1 tablet (8 mg total) by mouth at bedtime. 30 tablet 1   Multiple Vitamin (MULTI-VITAMIN) tablet Take by mouth. (Patient not taking: Reported on 09/16/2021)     sertraline (ZOLOFT) 50 MG tablet Take 1 tablet (50 mg total) by mouth daily. 90 tablet 1   No current facility-administered medications for this visit.     Musculoskeletal: Strength & Muscle Tone:  UTA Gait & Station:  UTA Patient leans: N/A  Psychiatric Specialty Exam: Review of Systems  Psychiatric/Behavioral:  Positive for sleep disturbance.        Mood swings  All other systems reviewed and are negative.   There were no vitals taken for this visit.There is no height or weight on file to calculate BMI.  General Appearance:  UTA  Eye Contact:   UTA  Speech:  Clear and Coherent  Volume:  Normal  Mood:   Mood swings , when she does not sleep well  Affect:   UTA  Thought  Process:  Goal Directed and Descriptions of Associations: Intact  Orientation:  Full (Time, Place, and Person)  Thought Content: Logical   Suicidal Thoughts:  No  Homicidal Thoughts:  No  Memory:  Immediate;   Fair Recent;   Fair Remote;   Fair  Judgement:  Fair  Insight:  Fair  Psychomotor Activity:   UTA  Concentration:  Concentration: Fair and Attention Span: Fair  Recall:  [employer] of Knowledge: Fair  Language: Fair  Akathisia:  No  Handed:  Right  AIMS (if indicated): not done  Assets:  Communication Skills Desire for Improvement Housing Transportation  ADL's:  Intact  Cognition: WNL  Sleep:   Restless   Screenings: GAD-7    Flowsheet Row Video Visit from 01/13/2021 in [place] [facility] Visit from 08/11/2018 in [facility]  Total GAD-7 Score 2 4      PHQ2-9    Flowsheet Row Video Visit from 09/16/2021 in [place] from 08/25/2021 in [place] at [facility] Visit from 08/06/2021 in [place] at [facility] Visit from 04/23/2021 in [place] Video Visit from 01/13/2021 in [place]  PHQ-2 Total Score 0 1 1 0 0  PHQ-9 Total Score -- 3 3 -- 1      Flowsheet Row Video Visit from 09/16/2021 in [place] ED from 05/17/2021 in [place] Video Visit from 09/25/2020 in [place] No Risk No Risk Low Risk        Assessment and Plan: Emily Butler is a 66 year old female, lives in bedside, has a history of depression, alcohol use disorder in remission, atrial fibrillation, hepatitis C was evaluated by telemedicine today.  Patient is currently struggling with sleep.  Plan as noted below.  Plan MDD in full remission Zoloft 50 mg p.o.  daily  Insomnia-unstable Start Rozerem 8 mg p.o. nightly Discussed sleep hygiene techniques.  She currently does not have a good sleep hygiene likely contributing to sleep problems.  Alcohol use disorder in remission Has been sober since the past 13  years.  Follow-up in clinic in 4 to 5 weeks or sooner if needed.   I have spent at least 15 minutes non face to face with patient today.     Consent: Patient/Guardian gives verbal consent for treatment and assignment of benefits for services provided during this visit. Patient/Guardian expressed understanding and agreed to proceed.   This note was generated in part or whole with voice recognition software. Voice recognition is usually quite accurate but there are transcription errors that can and very often do occur. I apologize for any typographical errors that were not detected and corrected.      Ursula Alert, MD 09/16/2021, 2:55 PM

## 2021-09-16 NOTE — Telephone Encounter (Signed)
Received request from pharmacy that Rozerem will not be covered by her health insurance plan.  Alternative medications -Belsomra, Ambien.  She has already tried and failed Ambien.  Will approve Belsomra 5 mg daily at bedtime.  Discussed this with patient.  Patient agreeable.

## 2021-10-20 ENCOUNTER — Other Ambulatory Visit: Payer: Self-pay | Admitting: Emergency Medicine

## 2021-10-20 DIAGNOSIS — M2550 Pain in unspecified joint: Secondary | ICD-10-CM

## 2021-10-26 ENCOUNTER — Telehealth: Payer: Medicare Other | Admitting: Psychiatry

## 2021-12-04 ENCOUNTER — Other Ambulatory Visit: Payer: Self-pay | Admitting: Emergency Medicine

## 2021-12-04 DIAGNOSIS — M2550 Pain in unspecified joint: Secondary | ICD-10-CM

## 2021-12-04 MED ORDER — DICLOFENAC SODIUM 75 MG PO TBEC: 75.0000 mg | DELAYED_RELEASE_TABLET | Freq: Two times a day (BID) | ORAL | 0 refills | Status: DC | PRN

## 2021-12-29 ENCOUNTER — Telehealth (INDEPENDENT_AMBULATORY_CARE_PROVIDER_SITE_OTHER): Payer: Medicare Other | Admitting: Psychiatry

## 2021-12-29 ENCOUNTER — Encounter: Payer: Self-pay | Admitting: Psychiatry

## 2021-12-29 DIAGNOSIS — G4701 Insomnia due to medical condition: Secondary | ICD-10-CM | POA: Diagnosis not present

## 2021-12-29 DIAGNOSIS — G4700 Insomnia, unspecified: Secondary | ICD-10-CM

## 2021-12-29 DIAGNOSIS — F1021 Alcohol dependence, in remission: Secondary | ICD-10-CM | POA: Diagnosis not present

## 2021-12-29 DIAGNOSIS — F3342 Major depressive disorder, recurrent, in full remission: Secondary | ICD-10-CM

## 2021-12-29 MED ORDER — BELSOMRA 15 MG PO TABS: 15.0000 mg | ORAL_TABLET | Freq: Every day | ORAL | 1 refills | Status: DC

## 2021-12-29 MED ORDER — SERTRALINE HCL 50 MG PO TABS: 75.0000 mg | ORAL_TABLET | Freq: Every day | ORAL | 0 refills | Status: DC

## 2021-12-29 NOTE — Progress Notes (Unsigned)
Virtual Visit via Telephone Note  I connected with Emily Butler on 12/29/21 at 10:00 AM EDT by telephone and verified that I am speaking with the correct person using two identifiers.  Location Provider Location : [facility] Patient Location : Home  Participants: Patient , Provider    I discussed the limitations, risks, security and privacy concerns of performing an evaluation and management service by telephone and the availability of in person appointments. I also discussed with the patient that there may be a patient responsible charge related to this service. The patient expressed understanding and agreed to proceed.    I discussed the assessment and treatment plan with the patient. The patient was provided an opportunity to ask questions and all were answered. The patient agreed with the plan and demonstrated an understanding of the instructions.   The patient was advised to call back or seek an in-person evaluation if the symptoms worsen or if the condition fails to improve as anticipated.     [place] MD OP Progress Note  12/29/2021 11:08 AM SIDONIE ROSANDER  MRN:  [id number]  Chief Complaint:  Chief Complaint  Patient presents with   Follow-up   Anxiety   Medication Refill   Insomnia   HPI: [name] is a 66 year old female, retired, disabled, lives in [place], has a history of MDD, alcohol use disorder in remission, history of cirrhosis of liver without ascites, essential hypertension, gastroesophageal reflux disease without esophagitis, atrial fibrillation was evaluated by phone today.  Patient was unable to connect by video.  Patient today reports she had several psychosocial stressors including her daughter who recently lost her job , as does her daughter talked about some of the past trauma including how she abandoned her daughter as a child.  Patient reports this triggered a lot of intrusive memories from her past and triggered her depression symptoms.  She hence  decided to increase the dosage of sertraline herself to a 75 mg a month ago.  Since doing so her depression has improved.  Currently denies any significant sadness or low motivation.  Has been trying to spend her time focusing on activities, doing crafts and so on.  Patient continues to struggle with sleep.  Reports she tried the Belsomra for a week or so.  When it did not help she decided to just stop it.  Patient reports Belsomra is affordable for her with her insurance plan, only had to pay $4.  She is interested in giving it another chance and would like to increase the dosage.  Patient denies any suicidality, homicidality or perceptual disturbances.  Not interested in psychotherapy sessions at this time.  Reports she does not want to talk about her trauma.  Denies any other concerns today.  Visit Diagnosis:    ICD-10-CM   1. MDD (major depressive disorder), recurrent, in full remission ([place])  F33.42 sertraline (ZOLOFT) 50 MG tablet    2. Alcohol use disorder, moderate, in sustained remission (HCC)  F10.21     3. Insomnia, unspecified type  G47.00 sertraline (ZOLOFT) 50 MG tablet    Suvorexant (BELSOMRA) 15 MG TABS      Past Psychiatric History: Reviewed past psychiatric history from progress note on 12/11/2018.  Past Medical History:  Past Medical History:  Diagnosis Date   Alcoholic ([place])    Cirrhosis of liver ([place])    Depression    EXCESSIVE MENSTRUATION 11/08/2006   Qualifier: Diagnosis of  By: Maxie Better FNP, Rosalita Levan    GERD (gastroesophageal reflux  disease)    Hepatitis C 2011   Hypertension     Past Surgical History:  Procedure Laterality Date   CESAREAN SECTION      Family Psychiatric History: Reviewed family psychiatric history from progress note on 12/11/2018.  Family History:  Family History  Problem Relation Age of Onset   Alcohol abuse Mother    Drug abuse Mother    Heart disease Father     Social History: Reviewed social history from progress  note on 12/11/2018. Social History   Socioeconomic History   Marital status: Divorced    Spouse name: Not on file   Number of children: 4   Years of education: Not on file   Highest education level: Not on file  Occupational History   Occupation: disability  Tobacco Use   Smoking status: Former   Smokeless tobacco: Never  Scientific laboratory technician Use: Never used  Substance and Sexual Activity   Alcohol use: Not Currently    Comment: Alcoholic quit 10 years ago   Drug use: Never   Sexual activity: Not Currently  Other Topics Concern   Not on file  Social History Narrative   Not on file   Social Determinants of Health   Financial Resource Strain: Low Risk  (08/25/2021)   Overall Financial Resource Strain (CARDIA)    Difficulty of Paying Living Expenses: Not hard at all  Food Insecurity: No Food Insecurity (08/25/2021)   Hunger Vital Sign    Worried About Running Out of Food in the Last Year: Never true    [place] in the Last Year: Never true  Transportation Needs: No Transportation Needs (08/25/2021)   PRAPARE - Hydrologist (Medical): No    Lack of Transportation (Non-Medical): No  Physical Activity: Sufficiently Active (08/25/2021)   Exercise Vital Sign    Days of Exercise per Week: 7 days    Minutes of Exercise per Session: 30 min  Stress: No Stress Concern Present (08/25/2021)   [place]    Feeling of Stress : Not at all  Social Connections: Socially Isolated (08/25/2021)   Social Connection and Isolation Panel [NHANES]    Frequency of Communication with Friends and Family: More than three times a week    Frequency of Social Gatherings with Friends and Family: More than three times a week    Attends Religious Services: Never    Marine scientist or Organizations: No    Attends Archivist Meetings: Never    Marital Status: Divorced    Allergies:   Allergies  Allergen Reactions   Duloxetine Other (See Comments) and Nausea Only   Codeine     REACTION: GI upset    Metabolic Disorder Labs: Lab Results  Component Value Date   HGBA1C 5.3 08/17/2018   MPG 105 08/17/2018   No results found for: "PROLACTIN" Lab Results  Component Value Date   CHOL 221 (H) 08/06/2021   TRIG 134.0 08/06/2021   HDL 56.50 08/06/2021   CHOLHDL 4 08/06/2021   VLDL 26.8 08/06/2021   LDLCALC 138 (H) 08/06/2021   LDLCALC 145 (H) 08/17/2019   Lab Results  Component Value Date   TSH 2.12 08/06/2021    Therapeutic Level Labs: No results found for: "LITHIUM" No results found for: "VALPROATE" No results found for: "CBMZ"  Current Medications: Current Outpatient Medications  Medication Sig Dispense Refill   diclofenac (VOLTAREN) 75 MG EC tablet  Take 1 tablet (75 mg total) by mouth 2 (two) times daily as needed. Do not take every day. 30 tablet 0   loperamide (IMODIUM A-D) 2 MG tablet Take 1 tablet (2 mg total) by mouth 4 (four) times daily as needed for diarrhea or loose stools. 30 tablet 1   losartan (COZAAR) 25 MG tablet TAKE 1 TABLET (25 MG TOTAL) BY MOUTH DAILY. 90 tablet 1   omeprazole (PRILOSEC) 20 MG capsule TAKE 1 CAPSULE BY MOUTH EVERY DAY 90 capsule 1   potassium chloride SA (KLOR-CON M) 20 MEQ tablet Take 1 tablet (20 mEq total) by mouth daily. Take with lasix 90 tablet 3   propranolol (INDERAL) 40 MG tablet TAKE 1 TABLET BY MOUTH TWICE A DAY 180 tablet 1   sertraline (ZOLOFT) 50 MG tablet Take 1 tablet (50 mg total) by mouth daily. (Patient taking differently: Take 75 mg by mouth daily.) 90 tablet 1   sertraline (ZOLOFT) 50 MG tablet Take 1.5 tablets (75 mg total) by mouth daily. DOSE INCREASE 135 tablet 0   Suvorexant (BELSOMRA) 15 MG TABS Take 15 mg by mouth at bedtime. 30 tablet 1   furosemide (LASIX) 40 MG tablet Take 1 tablet (40 mg total) by mouth daily. (Patient not taking: Reported on 12/29/2021) 90 tablet 3   Multiple Vitamin  (MULTI-VITAMIN) tablet Take by mouth. (Patient not taking: Reported on 09/16/2021)     No current facility-administered medications for this visit.     Musculoskeletal: Strength & Muscle Tone:  UTA Gait & Station:  UTA Patient leans: N/A  Psychiatric Specialty Exam: Review of Systems  Psychiatric/Behavioral:  Positive for dysphoric mood and sleep disturbance.   All other systems reviewed and are negative.   There were no vitals taken for this visit.There is no height or weight on file to calculate BMI.  General Appearance:  UTA  Eye Contact:   UTA  Speech:  Clear and Coherent  Volume:  Normal  Mood:  Depressed  Affect:   UTA  Thought Process:  Goal Directed and Descriptions of Associations: Intact  Orientation:  Full (Time, Place, and Person)  Thought Content: Logical   Suicidal Thoughts:  No  Homicidal Thoughts:  No  Memory:  Immediate;   Fair Recent;   Fair Remote;   Fair  Judgement:  Fair  Insight:  Fair  Psychomotor Activity:   UTA  Concentration:  Concentration: Fair and Attention Span: Fair  Recall:  [employer] of Knowledge: Fair  Language: Fair  Akathisia:  No  Handed:  Right  AIMS (if indicated): not done  Assets:  Communication Skills Desire for Improvement Housing Transportation  ADL's:  Intact  Cognition: WNL  Sleep:  Poor   Screenings: GAD-7    Flowsheet Row Video Visit from 01/13/2021 in [place] Office Visit from 08/11/2018 in [facility]  Total GAD-7 Score 2 4      PHQ2-9    Flowsheet Row Video Visit from 12/29/2021 in [place] Video Visit from 09/16/2021 in [place] from 08/25/2021 in [place] at [facility] Visit from 08/06/2021 in [place] at [facility] Visit from 04/23/2021 in [place]  PHQ-2 Total Score 0 0 1 1 0  PHQ-9 Total Score 4 -- 3 3 --       Flowsheet Row Video Visit from 12/29/2021 in [place] Video Visit from 09/16/2021 in [place] [facility] from  05/17/2021 in [place] No Risk No Risk No Risk        Assessment and Plan: Emily Butler is a 66 year old female, lives in bedside, has a history of depression, alcohol use disorder in remission, atrial fibrillation, hepatitis C was evaluated by phone today.  Patient noncompliant with medication recommendation, recently increased her dosage of sertraline without consulting, although she reports she has noticed good benefits from it.  Continues to struggle with sleep, will benefit from the following plan.  Plan MDD-improving Increase Zoloft 75 mg p.o. daily Discussed referral for CBT-patient declines  Insomnia-unstable Noncompliant on Belsomra 5 mg, agreeable to retrial of Belsomra.  Will increase Belsomra to 50 mg p.o. nightly Reviewed [place] PMP AWARxE Discussed sleep hygiene techniques.  Alcohol use disorder in remission Has been sober since the past 13 years.  Follow-up in clinic in 6 to 8 weeks or sooner in person.   I have spent at least 18 minutes non face to face with patient today .  Consent: Patient/Guardian gives verbal consent for treatment and assignment of benefits for services provided during this visit. Patient/Guardian expressed understanding and agreed to proceed.    This note was generated in part or whole with voice recognition software. Voice recognition is usually quite accurate but there are transcription errors that can and very often do occur. I apologize for any typographical errors that were not detected and corrected.     Ursula Alert, MD 12/29/2021, 11:08 AM

## 2021-12-30 ENCOUNTER — Other Ambulatory Visit: Payer: Self-pay | Admitting: Emergency Medicine

## 2021-12-30 DIAGNOSIS — M2550 Pain in unspecified joint: Secondary | ICD-10-CM

## 2022-01-03 ENCOUNTER — Other Ambulatory Visit: Payer: Self-pay | Admitting: Emergency Medicine

## 2022-01-03 DIAGNOSIS — M2550 Pain in unspecified joint: Secondary | ICD-10-CM

## 2022-01-04 ENCOUNTER — Encounter: Payer: Self-pay | Admitting: Emergency Medicine

## 2022-01-04 ENCOUNTER — Other Ambulatory Visit: Payer: Self-pay | Admitting: *Deleted

## 2022-01-04 DIAGNOSIS — M2550 Pain in unspecified joint: Secondary | ICD-10-CM

## 2022-01-04 MED ORDER — DICLOFENAC SODIUM 75 MG PO TBEC: 75.0000 mg | DELAYED_RELEASE_TABLET | Freq: Two times a day (BID) | ORAL | 0 refills | Status: DC | PRN

## 2022-01-27 ENCOUNTER — Other Ambulatory Visit: Payer: Self-pay | Admitting: Emergency Medicine

## 2022-01-27 DIAGNOSIS — K219 Gastro-esophageal reflux disease without esophagitis: Secondary | ICD-10-CM

## 2022-01-27 DIAGNOSIS — M2550 Pain in unspecified joint: Secondary | ICD-10-CM

## 2022-01-27 DIAGNOSIS — Z760 Encounter for issue of repeat prescription: Secondary | ICD-10-CM

## 2022-02-08 ENCOUNTER — Ambulatory Visit: Payer: Medicare Other | Admitting: Emergency Medicine

## 2022-02-26 ENCOUNTER — Other Ambulatory Visit: Payer: Self-pay | Admitting: Emergency Medicine

## 2022-02-26 DIAGNOSIS — M2550 Pain in unspecified joint: Secondary | ICD-10-CM

## 2022-02-26 DIAGNOSIS — Z760 Encounter for issue of repeat prescription: Secondary | ICD-10-CM

## 2022-02-26 DIAGNOSIS — I10 Essential (primary) hypertension: Secondary | ICD-10-CM

## 2022-02-28 ENCOUNTER — Other Ambulatory Visit: Payer: Self-pay | Admitting: Emergency Medicine

## 2022-02-28 DIAGNOSIS — M2550 Pain in unspecified joint: Secondary | ICD-10-CM

## 2022-02-28 DIAGNOSIS — Z760 Encounter for issue of repeat prescription: Secondary | ICD-10-CM

## 2022-02-28 DIAGNOSIS — I10 Essential (primary) hypertension: Secondary | ICD-10-CM

## 2022-03-08 ENCOUNTER — Telehealth: Payer: Medicare Other | Admitting: Psychiatry

## 2022-03-08 NOTE — Progress Notes (Signed)
No response to call or text or video invite  

## 2022-03-18 ENCOUNTER — Other Ambulatory Visit: Payer: Self-pay | Admitting: Emergency Medicine

## 2022-03-18 DIAGNOSIS — I4891 Unspecified atrial fibrillation: Secondary | ICD-10-CM

## 2022-03-18 DIAGNOSIS — I10 Essential (primary) hypertension: Secondary | ICD-10-CM

## 2022-03-18 DIAGNOSIS — Z760 Encounter for issue of repeat prescription: Secondary | ICD-10-CM

## 2022-03-29 ENCOUNTER — Other Ambulatory Visit: Payer: Self-pay | Admitting: Emergency Medicine

## 2022-03-29 DIAGNOSIS — M2550 Pain in unspecified joint: Secondary | ICD-10-CM

## 2022-03-29 MED ORDER — DICLOFENAC SODIUM 75 MG PO TBEC: 75.0000 mg | DELAYED_RELEASE_TABLET | Freq: Two times a day (BID) | ORAL | 0 refills | Status: DC | PRN

## 2022-04-05 ENCOUNTER — Encounter: Payer: Self-pay | Admitting: Emergency Medicine

## 2022-04-05 ENCOUNTER — Ambulatory Visit (INDEPENDENT_AMBULATORY_CARE_PROVIDER_SITE_OTHER): Payer: Medicare Other | Admitting: Emergency Medicine

## 2022-04-05 VITALS — BP 126/70 | HR 67 | Temp 98.6°F | Ht 63.0 in | Wt 186.0 lb

## 2022-04-05 DIAGNOSIS — B182 Chronic viral hepatitis C: Secondary | ICD-10-CM

## 2022-04-05 DIAGNOSIS — E785 Hyperlipidemia, unspecified: Secondary | ICD-10-CM | POA: Diagnosis not present

## 2022-04-05 DIAGNOSIS — Z1211 Encounter for screening for malignant neoplasm of colon: Secondary | ICD-10-CM

## 2022-04-05 DIAGNOSIS — Z23 Encounter for immunization: Secondary | ICD-10-CM

## 2022-04-05 DIAGNOSIS — M2550 Pain in unspecified joint: Secondary | ICD-10-CM

## 2022-04-05 DIAGNOSIS — M25512 Pain in left shoulder: Secondary | ICD-10-CM

## 2022-04-05 DIAGNOSIS — K7460 Unspecified cirrhosis of liver: Secondary | ICD-10-CM

## 2022-04-05 DIAGNOSIS — N1831 Chronic kidney disease, stage 3a: Secondary | ICD-10-CM

## 2022-04-05 DIAGNOSIS — M5442 Lumbago with sciatica, left side: Secondary | ICD-10-CM

## 2022-04-05 DIAGNOSIS — I10 Essential (primary) hypertension: Secondary | ICD-10-CM | POA: Diagnosis not present

## 2022-04-05 DIAGNOSIS — F3342 Major depressive disorder, recurrent, in full remission: Secondary | ICD-10-CM

## 2022-04-05 DIAGNOSIS — K219 Gastro-esophageal reflux disease without esophagitis: Secondary | ICD-10-CM | POA: Diagnosis not present

## 2022-04-05 DIAGNOSIS — G8929 Other chronic pain: Secondary | ICD-10-CM

## 2022-04-05 LAB — COMPREHENSIVE METABOLIC PANEL
ALT: 21 U/L (ref 0–35)
AST: 28 U/L (ref 0–37)
Albumin: 4.8 g/dL (ref 3.5–5.2)
Alkaline Phosphatase: 92 U/L (ref 39–117)
BUN: 35 mg/dL — ABNORMAL HIGH (ref 6–23)
CO2: 25 mEq/L (ref 19–32)
Calcium: 9.7 mg/dL (ref 8.4–10.5)
Chloride: 103 mEq/L (ref 96–112)
Creatinine, Ser: 1.08 mg/dL (ref 0.40–1.20)
GFR: 54.12 mL/min — ABNORMAL LOW (ref >60.00)
Glucose, Bld: 127 mg/dL — ABNORMAL HIGH (ref 70–99)
Potassium: 4.9 mEq/L (ref 3.5–5.1)
Sodium: 138 mEq/L (ref 135–145)
Total Bilirubin: 1.3 mg/dL — ABNORMAL HIGH (ref 0.2–1.2)
Total Protein: 7.3 g/dL (ref 6.0–8.3)

## 2022-04-05 LAB — LIPID PANEL
Cholesterol: 234 mg/dL — ABNORMAL HIGH (ref 0–200)
HDL: 56.1 mg/dL (ref >39.00)
LDL Cholesterol: 146 mg/dL — ABNORMAL HIGH (ref 0–99)
NonHDL: 177.46
Total CHOL/HDL Ratio: 4
Triglycerides: 158 mg/dL — ABNORMAL HIGH (ref 0.0–149.0)
VLDL: 31.6 mg/dL (ref 0.0–40.0)

## 2022-04-05 MED ORDER — PREDNISONE 20 MG PO TABS: 40.0000 mg | ORAL_TABLET | Freq: Every day | ORAL | 0 refills | Status: AC

## 2022-04-05 NOTE — Patient Instructions (Signed)

## 2022-04-05 NOTE — Assessment & Plan Note (Signed)
Stable.  Asymptomatic.  Takes omeprazole 20 mg daily.

## 2022-04-05 NOTE — Assessment & Plan Note (Signed)
Stable and well-controlled.  Without complications.

## 2022-04-05 NOTE — Assessment & Plan Note (Signed)
Well-controlled hypertension BP Readings from Last 3 Encounters:  04/05/22 126/70  08/06/21 120/64  05/17/21 (!) 170/100  Continue losartan 25 mg daily and propranolol 40 mg twice a day. Cardiovascular risks associated with hypertension discussed Diet and nutrition discussed.

## 2022-04-05 NOTE — Progress Notes (Signed)
Emily Butler 67 y.o.   Chief Complaint  Patient presents with   Follow-up    Having left side sciatica pain in hip     HISTORY OF PRESENT ILLNESS: This is a 67 y.o. female here for follow-up of chronic medical conditions. Has history of liver cirrhosis. History of chronic joint pains. Overall doing well. Has occasional left sciatica pain which is chronic No other complaints or medical concerns today.  HPI   Prior to Admission medications   Medication Sig Start Date End Date Taking? Authorizing Provider  diclofenac (VOLTAREN) 75 MG EC tablet Take 1 tablet (75 mg total) by mouth 2 (two) times daily as needed. Do not take every day. 03/29/22  Yes Martrell Eguia, Ines Bloomer, MD  loperamide (IMODIUM A-D) 2 MG tablet Take 1 tablet (2 mg total) by mouth 4 (four) times daily as needed for diarrhea or loose stools. 02/03/21  Yes Bhakti Labella, Ines Bloomer, MD  losartan (COZAAR) 25 MG tablet TAKE 1 TABLET (25 MG TOTAL) BY MOUTH DAILY. 02/28/22 05/29/22 Yes Joslin Doell, Ines Bloomer, MD  omeprazole (PRILOSEC) 20 MG capsule TAKE 1 CAPSULE BY MOUTH EVERY DAY 01/27/22  Yes Dema Timmons, Ines Bloomer, MD  potassium chloride SA (KLOR-CON M) 20 MEQ tablet Take 1 tablet (20 mEq total) by mouth daily. Take with lasix 02/03/21  Yes Antonella Upson, Ines Bloomer, MD  predniSONE (DELTASONE) 20 MG tablet Take 2 tablets (40 mg total) by mouth daily with breakfast for 5 days. 04/05/22 04/10/22 Yes Tenise Stetler, Ines Bloomer, MD  propranolol (INDERAL) 40 MG tablet TAKE 1 TABLET BY MOUTH TWICE A DAY 03/18/22  Yes Ysabella Babiarz, Ines Bloomer, MD  sertraline (ZOLOFT) 50 MG tablet Take 1.5 tablets (75 mg total) by mouth daily. DOSE INCREASE 12/29/21  Yes Eappen, Saramma, MD  Suvorexant (BELSOMRA) 15 MG TABS Take 15 mg by mouth at bedtime. 12/29/21  Yes Ursula Alert, MD  Multiple Vitamin (MULTI-VITAMIN) tablet Take by mouth. Patient not taking: Reported on 09/16/2021    [provider]    Allergies  Allergen Reactions   Duloxetine Other (See  Comments) and Nausea Only   Codeine     REACTION: GI upset    Patient Active Problem List   Diagnosis Date Noted   Arthralgia of multiple sites 02/03/2021   MDD (major depressive disorder), recurrent, in full remission ([place]) 09/26/2019   Hyperlipidemia 08/21/2019   MDD (major depressive disorder), recurrent, in partial remission ([place]) 06/25/2019   MDD (major depressive disorder), recurrent episode, mild ([place]) 12/11/2018   Alcohol use disorder, moderate, in sustained remission ([place]) 12/11/2018   IFG (impaired fasting glucose) 12/07/2018   Current severe episode of major depressive disorder without psychotic features ([place]) 12/07/2018   Insomnia 12/07/2018   Anemia due to chronic kidney disease 11/06/2014   Chronic kidney disease, unspecified 08/13/2013   Gastroesophageal reflux disease without esophagitis 08/13/2013   Restless legs syndrome (RLS) 95/10/3265   Umbilical hernia 12/45/8099   Atrial fibrillation ([place]) 07/07/2012   Hepatic cirrhosis ([place]) 07/07/2012   Former smoker 07/07/2012   Hypertension 07/07/2012   Chronic hepatitis C ([place]) 07/07/2012   THROMBOCYTOPENIA 12/20/2006    Past Medical History:  Diagnosis Date   Alcoholic ([place])    Cirrhosis of liver ([place])    Depression    EXCESSIVE MENSTRUATION 11/08/2006   Qualifier: Diagnosis of  By: Maxie Better FNP, Rosalita Levan    GERD (gastroesophageal reflux disease)    Hepatitis C 2011   Hypertension     Past Surgical History:  Procedure Laterality Date   CESAREAN  SECTION      Social History   Socioeconomic History   Marital status: Divorced    Spouse name: Not on file   Number of children: 4   Years of education: Not on file   Highest education level: Not on file  Occupational History   Occupation: disability  Tobacco Use   Smoking status: Former   Smokeless tobacco: Never  Scientific laboratory technician Use: Never used  Substance and Sexual Activity   Alcohol use: Not Currently    Comment: Alcoholic quit 10 years  ago   Drug use: Never   Sexual activity: Not Currently  Other Topics Concern   Not on file  Social History Narrative   Not on file   Social Determinants of Health   Financial Resource Strain: Low Risk  (08/25/2021)   Overall Financial Resource Strain (CARDIA)    Difficulty of Paying Living Expenses: Not hard at all  Food Insecurity: No Food Insecurity (08/25/2021)   Hunger Vital Sign    Worried About Running Out of Food in the Last Year: Never true    [place] in the Last Year: Never true  Transportation Needs: No Transportation Needs (08/25/2021)   PRAPARE - Hydrologist (Medical): No    Lack of Transportation (Non-Medical): No  Physical Activity: Sufficiently Active (08/25/2021)   Exercise Vital Sign    Days of Exercise per Week: 7 days    Minutes of Exercise per Session: 30 min  Stress: No Stress Concern Present (08/25/2021)   [place]    Feeling of Stress : Not at all  Social Connections: Socially Isolated (08/25/2021)   Social Connection and Isolation Panel [NHANES]    Frequency of Communication with Friends and Family: More than three times a week    Frequency of Social Gatherings with Friends and Family: More than three times a week    Attends Religious Services: Never    Marine scientist or Organizations: No    Attends Archivist Meetings: Never    Marital Status: Divorced  Human resources officer Violence: Not At Risk (08/25/2021)   Humiliation, Afraid, Rape, and Kick questionnaire    Fear of Current or Ex-Partner: No    Emotionally Abused: No    Physically Abused: No    Sexually Abused: No    Family History  Problem Relation Age of Onset   Alcohol abuse Mother    Drug abuse Mother    Heart disease Father      Review of Systems  Constitutional: Negative.  Negative for chills and fever.  HENT: Negative.  Negative for congestion, nosebleeds and sore  throat.   Respiratory: Negative.  Negative for cough, hemoptysis and shortness of breath.   Cardiovascular: Negative.  Negative for chest pain and palpitations.  Gastrointestinal: Negative.  Negative for abdominal pain, blood in stool, diarrhea, nausea and vomiting.  Genitourinary:  Negative for hematuria.  Musculoskeletal:  Positive for back pain and joint pain.  Skin: Negative.  Negative for rash.  Neurological: Negative.  Negative for dizziness and headaches.  All other systems reviewed and are negative.  Today's Vitals   04/05/22 1406  BP: 126/70  Pulse: 67  Temp: 98.6 F (37 C)  TempSrc: Oral  SpO2: 97%  Weight: 186 lb (84.4 kg)  Height: 5\' 3"  (1.6 m)   Body mass index is 32.95 kg/m.   Physical Exam Vitals reviewed.  Constitutional:  Appearance: Normal appearance.  HENT:     Head: Normocephalic.     Mouth/Throat:     Mouth: Mucous membranes are moist.     Pharynx: Oropharynx is clear.  Eyes:     Extraocular Movements: Extraocular movements intact.     Pupils: Pupils are equal, round, and reactive to light.  Cardiovascular:     Rate and Rhythm: Normal rate and regular rhythm.     Pulses: Normal pulses.     Heart sounds: Normal heart sounds.  Pulmonary:     Effort: Pulmonary effort is normal.     Breath sounds: Normal breath sounds.  Abdominal:     Palpations: Abdomen is soft.     Tenderness: There is no abdominal tenderness.  Musculoskeletal:     Cervical back: No tenderness.  Lymphadenopathy:     Cervical: No cervical adenopathy.  Skin:    General: Skin is warm and dry.  Neurological:     General: No focal deficit present.     Mental Status: She is alert and oriented to person, place, and time.  Psychiatric:        Mood and Affect: Mood normal.        Behavior: Behavior normal.      ASSESSMENT & PLAN: A total of 44 minutes was spent with the patient and counseling/coordination of care regarding preparing for this visit, review of most recent  office visit notes, review of multiple chronic medical conditions under management, review of all medications, review of most recent blood work results, education on nutrition, prognosis, documentation, and need for follow-up.  Problem List Items Addressed This Visit       Cardiovascular and Mediastinum   Hypertension - Primary    Well-controlled hypertension BP Readings from Last 3 Encounters:  04/05/22 126/70  08/06/21 120/64  05/17/21 (!) 170/100  Continue losartan 25 mg daily and propranolol 40 mg twice a day. Cardiovascular risks associated with hypertension discussed Diet and nutrition discussed.       Relevant Orders   Comprehensive metabolic panel     Digestive   Hepatic cirrhosis (HCC)    Stable and well-controlled.  Without complications.      Gastroesophageal reflux disease without esophagitis    Stable.  Asymptomatic.  Takes omeprazole 20 mg daily.      Chronic hepatitis C (HCC)    Stable.  Asymptomatic.  No concerns.        Genitourinary   Chronic kidney disease, unspecified    Advised to stay well-hydrated and limit use of NSAIDs.        Other   Hyperlipidemia   Relevant Orders   Lipid panel   MDD (major depressive disorder), recurrent, in full remission (HCC)    Stable and much improved.  Continue Zoloft 50 mg daily.      Arthralgia of multiple sites    Better controlled with limited use of diclofenac.      Relevant Medications   predniSONE (DELTASONE) 20 MG tablet   Other Relevant Orders   Ambulatory referral to Sports Medicine   Other Visit Diagnoses     Chronic bilateral low back pain with left-sided sciatica       Relevant Medications   predniSONE (DELTASONE) 20 MG tablet   Other Relevant Orders   Ambulatory referral to Sports Medicine   Chronic left shoulder pain       Relevant Medications   predniSONE (DELTASONE) 20 MG tablet   Other Relevant Orders   Ambulatory referral to Sports Medicine  Flu vaccine need       Relevant  Orders   Flu Vaccine QUAD 6+ mos PF IM (Fluarix Quad PF) (Completed)   Colon cancer screening       Relevant Orders   Cologuard        Patient Instructions  Health Maintenance, Female Adopting a healthy lifestyle and getting preventive care are important in promoting health and wellness. Ask your health care provider about: The right schedule for you to have regular tests and exams. Things you can do on your own to prevent diseases and keep yourself healthy. What should I know about diet, weight, and exercise? Eat a healthy diet  Eat a diet that includes plenty of vegetables, fruits, low-fat dairy products, and lean protein. Do not eat a lot of foods that are high in solid fats, added sugars, or sodium. Maintain a healthy weight Body mass index (BMI) is used to identify weight problems. It estimates body fat based on height and weight. Your health care provider can help determine your BMI and help you achieve or maintain a healthy weight. Get regular exercise Get regular exercise. This is one of the most important things you can do for your health. Most adults should: Exercise for at least 150 minutes each week. The exercise should increase your heart rate and make you sweat (moderate-intensity exercise). Do strengthening exercises at least twice a week. This is in addition to the moderate-intensity exercise. Spend less time sitting. Even light physical activity can be beneficial. Watch cholesterol and blood lipids Have your blood tested for lipids and cholesterol at 65 years of age, then have this test every 5 years. Have your cholesterol levels checked more often if: Your lipid or cholesterol levels are high. You are older than 65 years of age. You are at high risk for heart disease. What should I know about cancer screening? Depending on your health history and family history, you may need to have cancer screening at various ages. This may include screening for: Breast  cancer. Cervical cancer. Colorectal cancer. Skin cancer. Lung cancer. What should I know about heart disease, diabetes, and high blood pressure? Blood pressure and heart disease High blood pressure causes heart disease and increases the risk of stroke. This is more likely to develop in people who have high blood pressure readings or are overweight. Have your blood pressure checked: Every 3-5 years if you are 23-45 years of age. Every year if you are 52 years old or older. Diabetes Have regular diabetes screenings. This checks your fasting blood sugar level. Have the screening done: Once every three years after age 50 if you are at a normal weight and have a low risk for diabetes. More often and at a younger age if you are overweight or have a high risk for diabetes. What should I know about preventing infection? Hepatitis B If you have a higher risk for hepatitis B, you should be screened for this virus. Talk with your health care provider to find out if you are at risk for hepatitis B infection. Hepatitis C Testing is recommended for: Everyone born from [birth year] through 1963. Anyone with known risk factors for hepatitis C. Sexually transmitted infections (STIs) Get screened for STIs, including gonorrhea and chlamydia, if: You are sexually active and are younger than 65 years of age. You are older than 65 years of age and your health care provider tells you that you are at risk for this type of infection. Your sexual activity has changed since  you were last screened, and you are at increased risk for chlamydia or gonorrhea. Ask your health care provider if you are at risk. Ask your health care provider about whether you are at high risk for HIV. Your health care provider may recommend a prescription medicine to help prevent HIV infection. If you choose to take medicine to prevent HIV, you should first get tested for HIV. You should then be tested every 3 months for as long as you are taking  the medicine. Pregnancy If you are about to stop having your period (premenopausal) and you may become pregnant, seek counseling before you get pregnant. Take 400 to 800 micrograms (mcg) of folic acid every day if you become pregnant. Ask for birth control (contraception) if you want to prevent pregnancy. Osteoporosis and menopause Osteoporosis is a disease in which the bones lose minerals and strength with aging. This can result in bone fractures. If you are 3 years old or older, or if you are at risk for osteoporosis and fractures, ask your health care provider if you should: Be screened for bone loss. Take a calcium or vitamin D supplement to lower your risk of fractures. Be given hormone replacement therapy (HRT) to treat symptoms of menopause. Follow these instructions at home: Alcohol use Do not drink alcohol if: Your health care provider tells you not to drink. You are pregnant, may be pregnant, or are planning to become pregnant. If you drink alcohol: Limit how much you have to: 0-1 drink a day. Know how much alcohol is in your drink. In the [place] one drink equals one 12 oz bottle of beer (355 mL), one 5 oz glass of wine (148 mL), or one 1 oz glass of hard liquor (44 mL). Lifestyle Do not use any products that contain nicotine or tobacco. These products include cigarettes, chewing tobacco, and vaping devices, such as e-cigarettes. If you need help quitting, ask your health care provider. Do not use street drugs. Do not share needles. Ask your health care provider for help if you need support or information about quitting drugs. General instructions Schedule regular health, dental, and eye exams. Stay current with your vaccines. Tell your health care provider if: You often feel depressed. You have ever been abused or do not feel safe at home. Summary Adopting a healthy lifestyle and getting preventive care are important in promoting health and wellness. Follow your health  care provider's instructions about healthy diet, exercising, and getting tested or screened for diseases. Follow your health care provider's instructions on monitoring your cholesterol and blood pressure. This information is not intended to replace advice given to you by your health care provider. Make sure you discuss any questions you have with your health care provider. Document Revised: 07/07/2020 Document Reviewed: 07/07/2020 Elsevier Patient Education  [place], MD [facility]

## 2022-04-05 NOTE — Assessment & Plan Note (Signed)
Stable and much improved.  Continue Zoloft 50 mg daily.

## 2022-04-05 NOTE — Assessment & Plan Note (Signed)
Better controlled with limited use of diclofenac.

## 2022-04-05 NOTE — Assessment & Plan Note (Signed)
Advised to stay well-hydrated and limit use of NSAIDs. 

## 2022-04-05 NOTE — Assessment & Plan Note (Signed)
Stable.  Asymptomatic. No concerns. 

## 2022-04-16 ENCOUNTER — Encounter: Payer: Self-pay | Admitting: Psychiatry

## 2022-04-16 ENCOUNTER — Telehealth (INDEPENDENT_AMBULATORY_CARE_PROVIDER_SITE_OTHER): Payer: Medicare Other | Admitting: Psychiatry

## 2022-04-16 DIAGNOSIS — F1021 Alcohol dependence, in remission: Secondary | ICD-10-CM | POA: Diagnosis not present

## 2022-04-16 DIAGNOSIS — G4701 Insomnia due to medical condition: Secondary | ICD-10-CM | POA: Diagnosis not present

## 2022-04-16 DIAGNOSIS — F3342 Major depressive disorder, recurrent, in full remission: Secondary | ICD-10-CM | POA: Diagnosis not present

## 2022-04-16 MED ORDER — BELSOMRA 15 MG PO TABS: 15.0000 mg | ORAL_TABLET | Freq: Every day | ORAL | 1 refills | Status: DC

## 2022-04-16 MED ORDER — SERTRALINE HCL 100 MG PO TABS: 100.0000 mg | ORAL_TABLET | Freq: Every day | ORAL | 0 refills | Status: DC

## 2022-04-16 NOTE — Progress Notes (Signed)
Virtual Visit via Video Note  I connected with Emily Butler on 04/16/22 at 10:00 AM EST by a video enabled telemedicine application and verified that I am speaking with the correct person using two identifiers.  Location Provider Location : Remote office Patient Location : Home  Participants: Patient , Provider    I discussed the limitations of evaluation and management by telemedicine and the availability of in person appointments. The patient expressed understanding and agreed to proceed.   I discussed the assessment and treatment plan with the patient. The patient was provided an opportunity to ask questions and all were answered. The patient agreed with the plan and demonstrated an understanding of the instructions.   The patient was advised to call back or seek an in-person evaluation if the symptoms worsen or if the condition fails to improve as anticipated.  Video connection was lost at less than 50% of the duration of the visit, at which time the remainder of the visit was completed through audio only    [facility] MD OP Progress Note  04/16/2022 12:34 PM Emily Butler  MRN:  [id number]  Chief Complaint:  Chief Complaint  Patient presents with   Medication Refill   Follow-up   Depression   Anxiety   HPI: Emily Butler is a 67 year old female, retired, disabled, lives in [place], has a history of MDD, alcohol use disorder in remission, history of cirrhosis of liver, essential hypertension, gastroesophageal reflux disease without esophagitis, atrial fibrillation was evaluated by telemedicine today.  Patient today reports she is currently in a lot of pain.  Reports none of her medications are giving her enough pain control.  She reports she does have diclofenac available however she is not supposed to take it every day and hence her pain is out of control at this time.  She reports she has upcoming appointment with an orthopedic provider beginning of March and looks forward to  that.  Reports she has these episodes of being irritable, anxious, sad on and off.  She also reports sleep problems.  Mostly because of her pain.  She however believes her Belsomra helped her to maintain sleep when she took it.  Denied any side effects.  However she ran out.  Currently compliant on the sertraline.  Agreeable to dosage increase to address her mood symptoms.  Denies any suicidality, homicidality .  Reports however she may be having perceptual disturbances-unknown if true psychosis since patient reports if she looks at something for too long then it changes color or moves.  Patient agreeable to follow up with her providers, may need another eye exam.  Patient denies any other concerns today.   Visit Diagnosis:    ICD-10-CM   1. MDD (major depressive disorder), recurrent, in full remission ([place])  F33.42 sertraline (ZOLOFT) 100 MG tablet    2. Alcohol use disorder, moderate, in sustained remission (H[facility])  F10.21     3. Insomnia due to medical condition  G47.01 Suvorexant (BELSOMRA) 15 MG TABS   mood, pain      Past Psychiatric History: Reviewed past psychiatric history from progress note on 12/11/2018.  Past Medical History:  Past Medical History:  Diagnosis Date   Alcoholic ([place])    Cirrhosis of liver ([place])    Depression    EXCESSIVE MENSTRUATION 11/08/2006   Qualifier: Diagnosis of  By: Maxie Better FNP, Rosalita Levan    GERD (gastroesophageal reflux disease)    Hepatitis C 2011   Hypertension     Past Surgical  History:  Procedure Laterality Date   CESAREAN SECTION      Family Psychiatric History: Reviewed family psychiatric history from progress note on 12/11/2018.  Family History:  Family History  Problem Relation Age of Onset   Alcohol abuse Mother    Drug abuse Mother    Heart disease Father     Social History: Reviewed social history from progress note on 12/11/2018. Social History   Socioeconomic History   Marital status: Divorced    Spouse name:  Not on file   Number of children: 4   Years of education: Not on file   Highest education level: Not on file  Occupational History   Occupation: disability  Tobacco Use   Smoking status: Former   Smokeless tobacco: Never  Scientific laboratory technician Use: Never used  Substance and Sexual Activity   Alcohol use: Not Currently    Comment: Alcoholic quit 10 years ago   Drug use: Never   Sexual activity: Not Currently  Other Topics Concern   Not on file  Social History Narrative   Not on file   Social Determinants of Health   Financial Resource Strain: Low Risk  (08/25/2021)   Overall Financial Resource Strain (CARDIA)    Difficulty of Paying Living Expenses: Not hard at all  Food Insecurity: No Food Insecurity (08/25/2021)   Hunger Vital Sign    Worried About Running Out of Food in the Last Year: Never true    [place] in the Last Year: Never true  Transportation Needs: No Transportation Needs (08/25/2021)   PRAPARE - Hydrologist (Medical): No    Lack of Transportation (Non-Medical): No  Physical Activity: Sufficiently Active (08/25/2021)   Exercise Vital Sign    Days of Exercise per Week: 7 days    Minutes of Exercise per Session: 30 min  Stress: No Stress Concern Present (08/25/2021)   [place]    Feeling of Stress : Not at all  Social Connections: Socially Isolated (08/25/2021)   Social Connection and Isolation Panel [NHANES]    Frequency of Communication with Friends and Family: More than three times a week    Frequency of Social Gatherings with Friends and Family: More than three times a week    Attends Religious Services: Never    Marine scientist or Organizations: No    Attends Archivist Meetings: Never    Marital Status: Divorced    Allergies:  Allergies  Allergen Reactions   Duloxetine Other (See Comments) and Nausea Only   Codeine     REACTION: GI  upset    Metabolic Disorder Labs: Lab Results  Component Value Date   HGBA1C 5.3 08/17/2018   MPG 105 08/17/2018   No results found for: "PROLACTIN" Lab Results  Component Value Date   CHOL 234 (H) 04/05/2022   TRIG 158.0 (H) 04/05/2022   HDL 56.10 04/05/2022   CHOLHDL 4 04/05/2022   VLDL 31.6 04/05/2022   LDLCALC 146 (H) 04/05/2022   LDLCALC 138 (H) 08/06/2021   Lab Results  Component Value Date   TSH 2.12 08/06/2021    Therapeutic Level Labs: No results found for: "LITHIUM" No results found for: "VALPROATE" No results found for: "CBMZ"  Current Medications: Current Outpatient Medications  Medication Sig Dispense Refill   diclofenac (VOLTAREN) 75 MG EC tablet Take 1 tablet (75 mg total) by mouth 2 (two) times daily as needed. Do  not take every day. 30 tablet 0   furosemide (LASIX) 20 MG tablet Take 20 mg by mouth.     loperamide (IMODIUM A-D) 2 MG tablet Take 1 tablet (2 mg total) by mouth 4 (four) times daily as needed for diarrhea or loose stools. 30 tablet 1   losartan (COZAAR) 25 MG tablet TAKE 1 TABLET (25 MG TOTAL) BY MOUTH DAILY. 90 tablet 0   omeprazole (PRILOSEC) 20 MG capsule TAKE 1 CAPSULE BY MOUTH EVERY DAY 90 capsule 1   propranolol (INDERAL) 40 MG tablet TAKE 1 TABLET BY MOUTH TWICE A DAY 180 tablet 1   sertraline (ZOLOFT) 100 MG tablet Take 1 tablet (100 mg total) by mouth daily with breakfast. 90 tablet 0   Multiple Vitamin (MULTI-VITAMIN) tablet Take by mouth. (Patient not taking: Reported on 09/16/2021)     potassium chloride SA (KLOR-CON M) 20 MEQ tablet Take 1 tablet (20 mEq total) by mouth daily. Take with lasix (Patient not taking: Reported on 04/16/2022) 90 tablet 3   Suvorexant (BELSOMRA) 15 MG TABS Take 1 tablet (15 mg total) by mouth at bedtime. 30 tablet 1   No current facility-administered medications for this visit.     Musculoskeletal: Strength & Muscle Tone:  UTA Gait & Station:  UTA Patient leans:  UTA  Psychiatric Specialty  Exam: Review of Systems  Musculoskeletal:  Positive for arthralgias.       Hip and ankle pain  Psychiatric/Behavioral:  Positive for sleep disturbance. The patient is nervous/anxious.   All other systems reviewed and are negative.   There were no vitals taken for this visit.There is no height or weight on file to calculate BMI.  General Appearance:  UTA  Eye Contact:   UTA  Speech:  Normal Rate  Volume:  Normal  Mood:  Anxious  Affect:   UTA  Thought Process:  Goal Directed and Descriptions of Associations: Intact  Orientation:  Full (Time, Place, and Person)  Thought Content: Logical   Suicidal Thoughts:  No  Homicidal Thoughts:  No  Memory:  Immediate;   Fair Recent;   Fair Remote;   Fair  Judgement:  Fair  Insight:  Fair  Psychomotor Activity:   UTA  Concentration:  Concentration: Fair and Attention Span: Fair  Recall:  [employer] of Knowledge: Fair  Language: Fair  Akathisia:  No  Handed:  Right  AIMS (if indicated): not done  Assets:  Communication Skills Desire for Improvement Housing Social Support  ADL's:  Intact  Cognition: WNL  Sleep:   Restless   Screenings: GAD-7    Flowsheet Row Video Visit from 01/13/2021 in [place] Office Visit from 08/11/2018 in [facility]  Total GAD-7 Score 2 4      PHQ2-9    [place] Office Visit from 04/05/2022 in [place] [facility] Video Visit from 12/29/2021 in [place] Video Visit from 09/16/2021 in [place] from 08/25/2021 in [place] [facility] [place] Visit from 08/06/2021 in [place] [facility]  PHQ-2 Total Score 0 0 0 1 1  PHQ-9 Total Score 0 4 -- 3 3      Flowsheet Row Video Visit from 04/16/2022 in [place] Video Visit  from 12/29/2021 in [place] Video Visit from 09/16/2021 in [facility]  C-SSRS RISK CATEGORY Low Risk No Risk No Risk        Assessment and Plan: KELSEA BAUERS is a 67 year old female, lives in [place], has a history of depression, alcohol use disorder in remission, atrial fibrillation, hepatitis C was evaluated by telemedicine today.  Patient is currently struggling with mood symptoms, sleep problems as well as has comorbid pain which does have an effect on her mood and sleep, will benefit from the following plan.  Plan MDD-unstable Increase Zoloft to 100 mg p.o. daily Patient was advised to follow-up with therapist in the past however she declined.  Insomnia-unstable Restart Belsomra 15 mg p.o. nightly Reviewed [place] PMP AWARxE Discussed sleep hygiene techniques.  Patient will need sufficient pain management.  Alcohol use disorder in remission Has been sober since the past more than 13 years.  Follow-up in clinic in 6 to 8 weeks or sooner if needed.   I have spent at least 20 minutes non face to face with patient today.      Collaboration of Care: Collaboration of Care: Other encouraged to follow-up with orthopedic for pain management.  Patient/Guardian was advised Release of Information must be obtained prior to any record release in order to collaborate their care with an outside provider. Patient/Guardian was advised if they have not already done so to contact the registration department to sign all necessary forms in order for [place] to release information regarding their care.   Consent: Patient/Guardian gives verbal consent for treatment and assignment of benefits for services provided during this visit. Patient/Guardian expressed understanding and agreed to proceed.   This note was generated in part or whole with voice recognition software. Voice recognition is usually quite accurate but there  are transcription errors that can and very often do occur. I apologize for any typographical errors that were not detected and corrected.      Ursula Alert, MD 04/16/2022, 12:34 PM

## 2022-04-30 NOTE — Progress Notes (Unsigned)
    Emily Butler D.[facility] [address] [place] [address] Phone: [phone]   Assessment and Plan:     There are no diagnoses linked to this encounter.  ***   Pertinent previous records reviewed include ***   Follow Up: ***     Subjective:   I, Emily Butler, am serving as a Neurosurgeon for Emily Butler  Chief Complaint: low back and left shoulder pain   HPI:   05/03/2022 Patient is a 67 year old female complaining of low back and left shoulder pain. Patient states  Relevant Historical Information: ***  Additional pertinent review of systems negative.   Current Outpatient Medications:    diclofenac (VOLTAREN) 75 MG EC tablet, Take 1 tablet (75 mg total) by mouth 2 (two) times daily as needed. Do not take every day., Disp: 30 tablet, Rfl: 0   furosemide (LASIX) 20 MG tablet, Take 20 mg by mouth., Disp: , Rfl:    loperamide (IMODIUM A-D) 2 MG tablet, Take 1 tablet (2 mg total) by mouth 4 (four) times daily as needed for diarrhea or loose stools., Disp: 30 tablet, Rfl: 1   losartan (COZAAR) 25 MG tablet, TAKE 1 TABLET (25 MG TOTAL) BY MOUTH DAILY., Disp: 90 tablet, Rfl: 0   Multiple Vitamin (MULTI-VITAMIN) tablet, Take by mouth. (Patient not taking: Reported on 09/16/2021), Disp: , Rfl:    omeprazole (PRILOSEC) 20 MG capsule, TAKE 1 CAPSULE BY MOUTH EVERY DAY, Disp: 90 capsule, Rfl: 1   potassium chloride SA (KLOR-CON M) 20 MEQ tablet, Take 1 tablet (20 mEq total) by mouth daily. Take with lasix (Patient not taking: Reported on 04/16/2022), Disp: 90 tablet, Rfl: 3   propranolol (INDERAL) 40 MG tablet, TAKE 1 TABLET BY MOUTH TWICE A DAY, Disp: 180 tablet, Rfl: 1   sertraline (ZOLOFT) 100 MG tablet, Take 1 tablet (100 mg total) by mouth daily with breakfast., Disp: 90 tablet, Rfl: 0   Suvorexant (BELSOMRA) 15 MG TABS, Take 1 tablet (15 mg total) by mouth at bedtime., Disp: 30 tablet, Rfl: 1   Objective:     There were no vitals  filed for this visit.    There is no height or weight on file to calculate BMI.    Physical Exam:    ***   Electronically signed by:  Emily Butler D.[facility] 12:20 PM 04/30/22

## 2022-05-03 ENCOUNTER — Encounter: Payer: Medicare Other | Admitting: Sports Medicine

## 2022-05-04 NOTE — Progress Notes (Signed)
This encounter was created in error - please disregard.

## 2022-05-27 ENCOUNTER — Telehealth (INDEPENDENT_AMBULATORY_CARE_PROVIDER_SITE_OTHER): Payer: Medicare Other | Admitting: Psychiatry

## 2022-05-27 ENCOUNTER — Encounter: Payer: Self-pay | Admitting: Psychiatry

## 2022-05-27 DIAGNOSIS — G4709 Other insomnia: Secondary | ICD-10-CM | POA: Diagnosis not present

## 2022-05-27 DIAGNOSIS — G4710 Hypersomnia, unspecified: Secondary | ICD-10-CM | POA: Diagnosis not present

## 2022-05-27 DIAGNOSIS — F1021 Alcohol dependence, in remission: Secondary | ICD-10-CM | POA: Diagnosis not present

## 2022-05-27 DIAGNOSIS — F3342 Major depressive disorder, recurrent, in full remission: Secondary | ICD-10-CM

## 2022-05-27 DIAGNOSIS — Z79899 Other long term (current) drug therapy: Secondary | ICD-10-CM

## 2022-05-27 MED ORDER — BELSOMRA 15 MG PO TABS: 15.0000 mg | ORAL_TABLET | Freq: Every day | ORAL | 1 refills | Status: DC

## 2022-05-27 MED ORDER — ROPINIROLE HCL 0.25 MG PO TABS: 0.2500 mg | ORAL_TABLET | Freq: Every day | ORAL | 1 refills | Status: DC

## 2022-05-27 NOTE — Patient Instructions (Signed)
Ropinirole Tablets What is this medication? ROPINIROLE (roe PIN i role) treats the symptoms of Parkinson disease. It works by acting like dopamine, a substance in your body that helps manage movements and coordination. This reduces the symptoms of Parkinson, such as body stiffness and tremors. It may also be used to treat restless legs syndrome (RLS). This medicine may be used for other purposes; ask your health care provider or pharmacist if you have questions. COMMON BRAND NAME(S): Requip What should I tell my care team before I take this medication? They need to know if you have any of these conditions: Heart disease High blood pressure Kidney disease Liver disease Low blood pressure Narcolepsy Sleep apnea Tobacco use An unusual or allergic reaction to ropinirole, other medications, foods, dyes, or preservatives Pregnant or trying to get pregnant Breast-feeding How should I use this medication? Take this medication by mouth with water. Take it as directed on the prescription label. You can take it with or without food. If it upsets your stomach, take it with food. If it upsets your stomach, take it with food. Keep taking this medication unless your care team tells you to stop. Stopping it too quickly can cause serious side effects. It can also make your condition worse. Talk to your care team about the use of this medication in children. Special care may be needed. Overdosage: If you think you have taken too much of this medicine contact a poison control center or emergency room at once. NOTE: This medicine is only for you. Do not share this medicine with others. What if I miss a dose? If you miss a dose, take it as soon as you can. If it is almost time for your next dose, take only that dose. Do not take double or extra doses. What may interact with this medication? Alcohol Antihistamines for allergy, cough and cold Certain medications for depression, anxiety, or mental health  conditions Certain medications for seizures, such as phenobarbital, primidone Certain medications for sleep Ciprofloxacin Estrogen or progestin hormones Fluvoxamine General anesthetics, such as halothane, isoflurane, methoxyflurane, propofol Medications for blood pressure Medications that relax muscles for surgery Metoclopramide Opioid medications for pain Rifampin Tobacco This list may not describe all possible interactions. Give your health care provider a list of all the medicines, herbs, non-prescription drugs, or dietary supplements you use. Also tell them if you smoke, drink alcohol, or use illegal drugs. Some items may interact with your medicine. What should I watch for while using this medication? Visit your care team for regular checks on your progress. Tell your care team if your symptoms do not start to get better or if they get worse. Do not suddenly stop taking this medication. You may develop a severe reaction. Your care team will tell you how much medication to take. If your care team wants you to stop the medication, the dose may be slowly lowered over time to avoid any side effects. This medication may affect your coordination, reaction time, or judgement. Do not drive or operate machinery until you know how this medication affects you. Sit up or stand slowly to reduce the risk of dizzy or fainting spells. Drinking alcohol with this medication can increase the risk of these side effects. When taking this medication, you may fall asleep without notice. You may be doing activities, such as driving a car, talking, or eating. You may not feel drowsy before it happens. Contact your care team right away if this happens to you. There have been reports  of increased sexual urges or other strong urges, such as gambling while taking this medication. If you experience any of these while taking this medication, you should report this to your care team as soon as possible. Your mouth may get  dry. Chewing sugarless gum or sucking hard candy and drinking plenty of water may help. Contact your care team if the problem does not go away or is severe. What side effects may I notice from receiving this medication? Side effects that you should report to your care team as soon as possible: Allergic reactions--skin rash, itching, hives, swelling of the face, lips, tongue, or throat Falling asleep during daily activities Low blood pressure--dizziness, feeling faint or lightheaded, blurry vision Mood and behavior changes--anxiety, nervousness, irritability and restlessness, confusion, hallucinations, feeling distrust or suspicion of others New or worsening uncontrolled and repetitive movements of the face, mouth, or upper body Slow heartbeat--dizziness, feeling faint or lightheaded, confusion, trouble breathing, unusual weakness or fatigue Urges to engage in impulsive behaviors such as gambling, binge eating, sexual activity, or shopping in ways that are unusual for you Side effects that usually do not require medical attention (report to your care team if they continue or are bothersome): Dizziness Drowsiness Nausea Swelling of the ankles, hands, or feet Unusual weakness or fatigue Upset stomach Vomiting This list may not describe all possible side effects. Call your doctor for medical advice about side effects. You may report side effects to FDA at 1-800-FDA-1088. Where should I keep my medication? Keep out of the reach of children and pets. Store at room temperature between 20 and 25 degrees C (68 and 77 degrees F). Protect from light and moisture. Keep the container tightly closed. Get rid of any unused medication after the expiration date. To get rid of medications that are no longer needed or have expired: Take the medication to a take-back program. Check with your pharmacy or law enforcement to find a location. If you cannot return the medication, check the label or package insert to  see if the medication should be thrown out in the garbage or flushed down the toilet. If you are not sure, ask your care team. If it is safe to put it in the trash, empty the medication out of the container. Mix the medication with cat litter, dirt, coffee grounds, or other unwanted substance. Seal the mixture in a bag or container. Put it in the trash. NOTE: This sheet is a summary. It may not cover all possible information. If you have questions about this medicine, talk to your doctor, pharmacist, or health care provider.  2023 Elsevier/Gold Standard (2021-05-05 00:00:00)

## 2022-05-27 NOTE — Progress Notes (Signed)
Virtual Visit via Telephone Note  I connected with Emily Butler on 05/27/22 at  3:00 PM EDT by telephone and verified that I am speaking with the correct person using two identifiers.  Location Provider Location : Remote Office  Patient Location : Home  Participants: Patient , Provider   I discussed the limitations, risks, security and privacy concerns of performing an evaluation and management service by telephone and the availability of in person appointments. I also discussed with the patient that there may be a patient responsible charge related to this service. The patient expressed understanding and agreed to proceed.    I discussed the assessment and treatment plan with the patient. The patient was provided an opportunity to ask questions and all were answered. The patient agreed with the plan and demonstrated an understanding of the instructions.   The patient was advised to call back or seek an in-person evaluation if the symptoms worsen or if the condition fails to improve as anticipated.    [facility] MD OP Progress Note  05/28/2022 6:30 PM Emily Butler  MRN:  [id number]  Chief Complaint:  Chief Complaint  Patient presents with   Follow-up   Medication Refill   Insomnia   HPI: Emily Butler is a 67 year old female,, retired, disabled, lives in [place], has a history of MDD, alcohol use disorder in remission, history of cirrhosis of liver, essential hypertension, gastroesophageal reflux disease, with esophagitis, atrial fibrillation was evaluated by phone today.  Patient was unable to connect via video and hence phone call was utilized for this appointment.  Patient today reports she is doing fairly well with regards to her mood.  Does not feel depressed.  She has anxiety only when she has to leave her home.  She however has been going out to go to the grocery stores as well as for other needs.  She has a bike that she rides to get to the stores.  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  Patient hence believes she has been managing her anxiety fairly well.  Patient however reports she has been struggling with sleep a lot.  Patient reports she does not have a good sleep hygiene and she is aware of that.  She reports she takes naps during the day at least 4 times a week which last for 1-1/2 hours.  She reports she stays up late painting or binge watching TV shows at least 4 times a week.  Patient reports she does not do that since she is energetic or is manic or hypomanic.  She reports she does that only because she is aware she will not be able to sleep and does not want to toss and turn all night.  Patient does report fatigue, hypersomnia during the day.  She also reports snoring at night.  She does report having possible restless leg symptoms at night.  She reports she recently decided to get some CBD gummies to help her with her sleep.  She reports she has a history of substance abuse and does not want to go back there.  She however was able to do some research and hence heard a lot of good things about cannabis.  She reports the CBD gummies definitely helped her with her sleep and pain.  She however reports she does not want to make a habit of using it.  Patient today denies any suicidality, homicidality or perceptual disturbances.  Patient reports she likes to  be alone most of the time and is content with that.  She has no friends and that is by choice.  She however reports she does keep in touch with her daughter on snapchat.  She does have dogs and that is therapeutic for her.  Patient denies any other concerns today.  Visit Diagnosis:    ICD-10-CM   1. MDD (major depressive disorder), recurrent, in full remission ([place])  F33.42     2. Alcohol use disorder, moderate, in sustained remission ([facility])  F10.21     3. Other insomnia  G47.09 Suvorexant (BELSOMRA) 15 MG TABS   Lack of sleep hygiene, possible RLS, R/O OSA     4. Hypersomnia  G47.10 Urine drugs of abuse scrn w alc, routine (Ref Lab)    rOPINIRole (REQUIP) 0.25 MG tablet    Ambulatory referral to Pulmonology    5. High risk medication use  Z79.899 Urine drugs of abuse scrn w alc, routine (Ref Lab)      Past Psychiatric History: Reviewed past psychiatric history from progress note on 12/11/2018.  Past Medical History:  Past Medical History:  Diagnosis Date   Alcoholic ([place])    Cirrhosis of liver ([place])    Depression    EXCESSIVE MENSTRUATION 11/08/2006   Qualifier: Diagnosis of  By: Maxie Better FNP, Rosalita Levan    GERD (gastroesophageal reflux disease)    Hepatitis C 2011   Hypertension     Past Surgical History:  Procedure Laterality Date   CESAREAN SECTION      Family Psychiatric History: Reviewed family psychiatric history from progress note on 12/11/2018.  Family History:  Family History  Problem Relation Age of Onset   Alcohol abuse Mother    Drug abuse Mother    Heart disease Father     Social History: Reviewed social history from progress note on 12/11/2018. Social History   Socioeconomic History   Marital status: Divorced    Spouse name: Not on file   Number of children: 4   Years of education: Not on file   Highest education level: Not on file  Occupational History   Occupation: disability  Tobacco Use   Smoking status: Former   Smokeless tobacco: Never  Scientific laboratory technician Use: Never used  Substance and Sexual Activity   Alcohol use: Not Currently    Comment: Alcoholic quit 10 years ago   Drug use: Never   Sexual activity: Not Currently  Other Topics Concern   Not on file  Social History Narrative   Not on file   Social Determinants of Health   Financial Resource Strain: Low Risk  (08/25/2021)   Overall Financial Resource Strain (CARDIA)    Difficulty of Paying Living Expenses: Not hard at all  Food Insecurity: No Food Insecurity (08/25/2021)   Hunger Vital Sign    Worried About Running Out of  Food in the Last Year: Never true    [place] in the Last Year: Never true  Transportation Needs: No Transportation Needs (08/25/2021)   PRAPARE - Hydrologist (Medical): No    Lack of Transportation (Non-Medical): No  Physical Activity: Sufficiently Active (08/25/2021)   Exercise Vital Sign    Days of Exercise per Week: 7 days    Minutes of Exercise per Session: 30 min  Stress: No Stress Concern Present (08/25/2021)   [place]    Feeling of Stress : Not at all  Social  Connections: Socially Isolated (08/25/2021)   Social Connection and Isolation Panel [NHANES]    Frequency of Communication with Friends and Family: More than three times a week    Frequency of Social Gatherings with Friends and Family: More than three times a week    Attends Religious Services: Never    Marine scientist or Organizations: No    Attends Archivist Meetings: Never    Marital Status: Divorced    Allergies:  Allergies  Allergen Reactions   Duloxetine Other (See Comments) and Nausea Only   Codeine     REACTION: GI upset    Metabolic Disorder Labs: Lab Results  Component Value Date   HGBA1C 5.3 08/17/2018   MPG 105 08/17/2018   No results found for: "PROLACTIN" Lab Results  Component Value Date   CHOL 234 (H) 04/05/2022   TRIG 158.0 (H) 04/05/2022   HDL 56.10 04/05/2022   CHOLHDL 4 04/05/2022   VLDL 31.6 04/05/2022   LDLCALC 146 (H) 04/05/2022   LDLCALC 138 (H) 08/06/2021   Lab Results  Component Value Date   TSH 2.12 08/06/2021    Therapeutic Level Labs: No results found for: "LITHIUM" No results found for: "VALPROATE" No results found for: "CBMZ"  Current Medications: Current Outpatient Medications  Medication Sig Dispense Refill   rOPINIRole (REQUIP) 0.25 MG tablet Take 1 tablet (0.25 mg total) by mouth at bedtime. 30 tablet 1   diclofenac (VOLTAREN) 75 MG EC  tablet Take 1 tablet (75 mg total) by mouth 2 (two) times daily as needed. Do not take every day. 30 tablet 0   furosemide (LASIX) 20 MG tablet Take 20 mg by mouth.     loperamide (IMODIUM A-D) 2 MG tablet Take 1 tablet (2 mg total) by mouth 4 (four) times daily as needed for diarrhea or loose stools. 30 tablet 1   losartan (COZAAR) 25 MG tablet TAKE 1 TABLET (25 MG TOTAL) BY MOUTH DAILY. 90 tablet 0   Multiple Vitamin (MULTI-VITAMIN) tablet Take by mouth. (Patient not taking: Reported on 09/16/2021)     omeprazole (PRILOSEC) 20 MG capsule TAKE 1 CAPSULE BY MOUTH EVERY DAY 90 capsule 1   potassium chloride SA (KLOR-CON M) 20 MEQ tablet Take 1 tablet (20 mEq total) by mouth daily. Take with lasix (Patient not taking: Reported on 04/16/2022) 90 tablet 3   propranolol (INDERAL) 40 MG tablet TAKE 1 TABLET BY MOUTH TWICE A DAY 180 tablet 1   sertraline (ZOLOFT) 100 MG tablet Take 1 tablet (100 mg total) by mouth daily with breakfast. 90 tablet 0   Suvorexant (BELSOMRA) 15 MG TABS Take 1 tablet (15 mg total) by mouth at bedtime. WILL REVIEW UDS PRIOR TO REFILL 30 tablet 1   No current facility-administered medications for this visit.     Musculoskeletal: Strength & Muscle Tone:  UTA Gait & Station:  Seated Patient leans: N/A  Psychiatric Specialty Exam: Review of Systems  Unable to perform ROS: Psychiatric disorder    There were no vitals taken for this visit.There is no height or weight on file to calculate BMI.  General Appearance:  UTA  Eye Contact:   UTA  Speech:  Normal Rate  Volume:  Normal  Mood:  Anxious  Affect:   UTA  Thought Process:  Goal Directed and Descriptions of Associations: Intact  Orientation:  Full (Time, Place, and Person)  Thought Content: Logical   Suicidal Thoughts:  No  Homicidal Thoughts:  No  Memory:  Immediate;  Fair Recent;   Fair Remote;   Fair  Judgement:  Fair  Insight:  Fair  Psychomotor Activity:   UTA  Concentration:  Concentration: Fair and  Attention Span: Fair  Recall:  [employer] of Knowledge: Fair  Language: Fair  Akathisia:  No  Handed:  Right  AIMS (if indicated): not done  Assets:  Communication Skills Desire for Improvement Housing Transportation  ADL's:  Intact  Cognition: WNL  Sleep:  Poor   Screenings: GAD-7    Flowsheet Row Video Visit from 01/13/2021 in [place] Office Visit from 08/11/2018 in [facility]  Total GAD-7 Score 2 4      PHQ2-9    Flowsheet Row Video Visit from 05/27/2022 in [place] Office Visit from 04/05/2022 in [place] at [facility] Video Visit from 12/29/2021 in [place] Video Visit from 09/16/2021 in [place] from 08/25/2021 in [place] at [facility]  PHQ-2 Total Score 0 0 0 0 1  PHQ-9 Total Score -- 0 4 -- 3      Flowsheet Row Video Visit from 05/27/2022 in [place] Video Visit from 04/16/2022 in [place] Video Visit from 12/29/2021 in [place] Low Risk Low Risk No Risk        Assessment and Plan: Emily Butler is a 67 year old female, lives in [place], has a history of depression, alcohol use disorder in remission, atrial fibrillation, hepatitis C was evaluated by phone today.  Patient continues to struggle with sleep problems, likely due to lack of sleep hygiene as well as has hypersomnia, fatigue, snoring at night.  Patient will benefit from the following plan.  Plan MDD-in remission Zoloft 100 mg p.o. daily  Insomnia-unstable Continue Belsomra 15 mg p.o. nightly for now. Reviewed [place] PMP AWARxE However will get a urine drug screen. Discussed sleep hygiene  techniques.  Patient sleep problems likely due to lack of sleep hygiene.   Hypersomnia-unstable Patient does report fatigue, snoring as well as taking naps during the day. Will refer to pulmonology for rule out sleep apnea. Patient also reports restless leg symptoms-hence will start Requip 0.25 mg p.o. nightly.   Alcohol use disorder in remission Has been sober since the past 13 years or more.  High risk medication use-will order urine drug screen in a patient with history of substance use disorder, mood symptoms and recent use of CBD.  Patient advised to go to [facility].  Provided counseling.    Follow-up in clinic in 6 weeks or sooner in person.   I have spent at least 22 minutes non face to face with patient today.     Collaboration of Care: Collaboration of Care: Other I have reported this patient for sleep consultation to pulmonology.  Patient/Guardian was advised Release of Information must be obtained prior to any record release in order to collaborate their care with an outside provider. Patient/Guardian was advised if they have not already done so to contact the registration department to sign all necessary forms in order for [place] to release information regarding their care.   Consent: Patient/Guardian gives verbal consent for treatment and assignment of benefits for services provided during this visit. Patient/Guardian expressed understanding and agreed to proceed.   This note was generated in part or whole  with voice recognition software. Voice recognition is usually quite accurate but there are transcription errors that can and very often do occur. I apologize for any typographical errors that were not detected and corrected.    Ursula Alert, MD 05/28/2022, 6:30 PM

## 2022-05-30 ENCOUNTER — Other Ambulatory Visit: Payer: Self-pay | Admitting: Emergency Medicine

## 2022-05-30 DIAGNOSIS — I10 Essential (primary) hypertension: Secondary | ICD-10-CM

## 2022-05-30 DIAGNOSIS — Z760 Encounter for issue of repeat prescription: Secondary | ICD-10-CM

## 2022-05-30 DIAGNOSIS — M2550 Pain in unspecified joint: Secondary | ICD-10-CM

## 2022-05-31 MED ORDER — DICLOFENAC SODIUM 75 MG PO TBEC: 75.0000 mg | DELAYED_RELEASE_TABLET | Freq: Two times a day (BID) | ORAL | 0 refills | Status: DC | PRN

## 2022-06-02 ENCOUNTER — Ambulatory Visit: Payer: Medicare Other | Admitting: Sports Medicine

## 2022-06-24 ENCOUNTER — Other Ambulatory Visit: Payer: Self-pay | Admitting: Psychiatry

## 2022-06-24 DIAGNOSIS — G4710 Hypersomnia, unspecified: Secondary | ICD-10-CM

## 2022-07-14 ENCOUNTER — Telehealth: Payer: Self-pay | Admitting: Radiology

## 2022-07-14 NOTE — Telephone Encounter (Signed)
Left voice mail for Emily Butler to call back at [phone] to schedule Medicare Annual Wellness Visit    Last AWV:  08/25/21   Please schedule Sequential AWV with [facility] Ramp

## 2022-07-20 ENCOUNTER — Ambulatory Visit: Payer: Medicare Other | Admitting: Psychiatry

## 2022-07-26 ENCOUNTER — Other Ambulatory Visit: Payer: Self-pay | Admitting: Psychiatry

## 2022-07-26 DIAGNOSIS — F3342 Major depressive disorder, recurrent, in full remission: Secondary | ICD-10-CM

## 2022-08-27 ENCOUNTER — Other Ambulatory Visit: Payer: Self-pay | Admitting: Emergency Medicine

## 2022-08-27 DIAGNOSIS — K219 Gastro-esophageal reflux disease without esophagitis: Secondary | ICD-10-CM

## 2022-08-27 DIAGNOSIS — I10 Essential (primary) hypertension: Secondary | ICD-10-CM

## 2022-08-27 DIAGNOSIS — Z760 Encounter for issue of repeat prescription: Secondary | ICD-10-CM

## 2022-08-30 ENCOUNTER — Telehealth: Payer: Self-pay | Admitting: Psychiatry

## 2022-08-30 ENCOUNTER — Telehealth: Payer: Self-pay | Admitting: Emergency Medicine

## 2022-08-30 NOTE — Telephone Encounter (Signed)
Patient called stating she will no longer be seeing Dr. Elna Breslow.Patient stated she confined in Dr.Eappen by telling her she took CBD  gummies to help her sleep. Patient stated Dr.Eappen asked her to take a urine test and she did not agree to taking it. Patient stated she is also weaning off of Zoloft.

## 2022-08-30 NOTE — Telephone Encounter (Signed)
I am okay with patient's wishes.

## 2022-08-30 NOTE — Telephone Encounter (Signed)
Patient contacted the office requesting to schedule TOC appointment. Advised patient we would send a message to current provider and new provider to get an approval from both. Advised patient we would call once response is received. Okay with all to schedule TOC in our office?

## 2022-08-31 ENCOUNTER — Encounter: Payer: Self-pay | Admitting: Psychiatry

## 2022-08-31 ENCOUNTER — Other Ambulatory Visit: Payer: Self-pay | Admitting: Emergency Medicine

## 2022-08-31 ENCOUNTER — Telehealth: Payer: Self-pay | Admitting: Emergency Medicine

## 2022-08-31 ENCOUNTER — Telehealth: Payer: Self-pay | Admitting: Psychiatry

## 2022-08-31 DIAGNOSIS — M2550 Pain in unspecified joint: Secondary | ICD-10-CM

## 2022-08-31 MED ORDER — DICLOFENAC SODIUM 75 MG PO TBEC: 75.0000 mg | DELAYED_RELEASE_TABLET | Freq: Two times a day (BID) | ORAL | 0 refills | Status: DC | PRN

## 2022-08-31 NOTE — Telephone Encounter (Signed)
Patient contacted the office stating that she did not want to follow-up with this provider anymore.  I have printed out a letter for this patient confirming that she needs to follow up with another community provider as soon as possible.  Letter to be mailed to patient.

## 2022-08-31 NOTE — Telephone Encounter (Signed)
TOC is ok with me.

## 2022-08-31 NOTE — Telephone Encounter (Signed)
Noted . Attaching Shawn , not sure if we need to send out a letter with community resources.

## 2022-08-31 NOTE — Telephone Encounter (Signed)
I have printed out a letter, letter provided to [employer], to mail it to patient.  Community resources to be attached to the letter.

## 2022-08-31 NOTE — Telephone Encounter (Signed)
Patient scheduled, thanks all for quick response

## 2022-08-31 NOTE — Telephone Encounter (Signed)
Contacted Emily Butler to schedule their annual wellness visit. Appointment made for 11/17/2022.Patient has transferred care to [facility].   [facility] Care Guide [facility] AWV TEAM Direct Dial: [phone]

## 2022-08-31 NOTE — Telephone Encounter (Signed)
Thank you :)

## 2022-09-17 ENCOUNTER — Other Ambulatory Visit: Payer: Self-pay | Admitting: Emergency Medicine

## 2022-09-17 DIAGNOSIS — I4891 Unspecified atrial fibrillation: Secondary | ICD-10-CM

## 2022-09-17 DIAGNOSIS — Z760 Encounter for issue of repeat prescription: Secondary | ICD-10-CM

## 2022-09-17 DIAGNOSIS — I10 Essential (primary) hypertension: Secondary | ICD-10-CM

## 2022-10-04 ENCOUNTER — Ambulatory Visit: Payer: Medicare Other | Admitting: Emergency Medicine

## 2022-10-14 ENCOUNTER — Encounter (INDEPENDENT_AMBULATORY_CARE_PROVIDER_SITE_OTHER): Payer: Self-pay

## 2022-11-15 ENCOUNTER — Other Ambulatory Visit: Payer: Self-pay | Admitting: Emergency Medicine

## 2022-11-15 ENCOUNTER — Telehealth: Payer: Self-pay | Admitting: Emergency Medicine

## 2022-11-15 ENCOUNTER — Other Ambulatory Visit: Payer: Self-pay | Admitting: *Deleted

## 2022-11-15 ENCOUNTER — Encounter: Payer: Medicare Other | Admitting: Family

## 2022-11-15 DIAGNOSIS — M2550 Pain in unspecified joint: Secondary | ICD-10-CM

## 2022-11-15 DIAGNOSIS — I4891 Unspecified atrial fibrillation: Secondary | ICD-10-CM

## 2022-11-15 DIAGNOSIS — Z760 Encounter for issue of repeat prescription: Secondary | ICD-10-CM

## 2022-11-15 DIAGNOSIS — K219 Gastro-esophageal reflux disease without esophagitis: Secondary | ICD-10-CM

## 2022-11-15 DIAGNOSIS — I10 Essential (primary) hypertension: Secondary | ICD-10-CM

## 2022-11-15 MED ORDER — OMEPRAZOLE 20 MG PO CPDR: 20.0000 mg | DELAYED_RELEASE_CAPSULE | Freq: Every day | ORAL | 0 refills | Status: DC

## 2022-11-15 MED ORDER — PROPRANOLOL HCL 40 MG PO TABS: 40.0000 mg | ORAL_TABLET | Freq: Two times a day (BID) | ORAL | 0 refills | Status: DC

## 2022-11-15 MED ORDER — LOSARTAN POTASSIUM 25 MG PO TABS: 25.0000 mg | ORAL_TABLET | Freq: Every day | ORAL | 0 refills | Status: DC

## 2022-11-15 MED ORDER — DICLOFENAC SODIUM 75 MG PO TBEC: 75.0000 mg | DELAYED_RELEASE_TABLET | Freq: Two times a day (BID) | ORAL | 0 refills | Status: DC | PRN

## 2022-11-15 NOTE — Telephone Encounter (Signed)
Prescription Request  11/15/2022  LOV: 04/05/2022  What is the name of the medication or equipment?  propranolol (INDERAL) 40 MG tablet  omeprazole (PRILOSEC) 20 MG capsule  losartan (COZAAR) 25 MG tablet diclofenac (VOLTAREN) 75 MG EC tablet  sertraline (ZOLOFT) 100 MG tablet   Have you contacted your pharmacy to request a refill? No   Which pharmacy would you like this sent to?  [id number], [place] - [address] [place] [address] Phone: [phone] Fax: [phone]    Patient notified that their request is being sent to the clinical staff for review and that they should receive a response within 2 business days.   Please advise at Mobile [phone] (mobile)

## 2022-11-15 NOTE — Telephone Encounter (Signed)
Medications sent to pharmacy. Zoloft is prescribed by her psychiatrist. Patient will need to call that office to request a refill

## 2022-11-17 ENCOUNTER — Ambulatory Visit (INDEPENDENT_AMBULATORY_CARE_PROVIDER_SITE_OTHER): Payer: Medicare Other | Admitting: *Deleted

## 2022-11-17 DIAGNOSIS — Z0000 Encounter for general adult medical examination without abnormal findings: Secondary | ICD-10-CM | POA: Diagnosis not present

## 2022-11-17 NOTE — Patient Instructions (Signed)
Emily Butler , Thank you for taking time to come for your Medicare Wellness Visit. I appreciate your ongoing commitment to your health goals. Please review the following plan we discussed and let me know if I can assist you in the future.   Screening recommendations/referrals: Colonoscopy  Education provided Mammogram: Education provided Bone Density: Education provided Recommended yearly ophthalmology/optometry visit for glaucoma screening and checkup Recommended yearly dental visit for hygiene and checkup  Vaccinations: Influenza vaccine: Education provided Pneumococcal vaccine:  Tdap vaccine: Education provided Shingles vaccine:      Preventive Care 65 Years and Older, Female Preventive care refers to lifestyle choices and visits with your health care provider that can promote health and wellness. What does preventive care include? A yearly physical exam. This is also called an annual well check. Dental exams once or twice a year. Routine eye exams. Ask your health care provider how often you should have your eyes checked. Personal lifestyle choices, including: Daily care of your teeth and gums. Regular physical activity. Eating a healthy diet. Avoiding tobacco and drug use. Limiting alcohol use. Practicing safe sex. Taking low-dose aspirin every day. Taking vitamin and mineral supplements as recommended by your health care provider. What happens during an annual well check? The services and screenings done by your health care provider during your annual well check will depend on your age, overall health, lifestyle risk factors, and family history of disease. Counseling  Your health care provider may ask you questions about your: Alcohol use. Tobacco use. Drug use. Emotional well-being. Home and relationship well-being. Sexual activity. Eating habits. History of falls. Memory and ability to understand (cognition). Work and work Astronomer. Reproductive  health. Screening  You may have the following tests or measurements: Height, weight, and BMI. Blood pressure. Lipid and cholesterol levels. These may be checked every 5 years, or more frequently if you are over 79 years old. Skin check. Lung cancer screening. You may have this screening every year starting at age 59 if you have a 30-pack-year history of smoking and currently smoke or have quit within the past 15 years. Fecal occult blood test (FOBT) of the stool. You may have this test every year starting at age 57. Flexible sigmoidoscopy or colonoscopy. You may have a sigmoidoscopy every 5 years or a colonoscopy every 10 years starting at age 42. Hepatitis C blood test. Hepatitis B blood test. Sexually transmitted disease (STD) testing. Diabetes screening. This is done by checking your blood sugar (glucose) after you have not eaten for a while (fasting). You may have this done every 1-3 years. Bone density scan. This is done to screen for osteoporosis. You may have this done starting at age 106. Mammogram. This may be done every 1-2 years. Talk to your health care provider about how often you should have regular mammograms. Talk with your health care provider about your test results, treatment options, and if necessary, the need for more tests. Vaccines  Your health care provider may recommend certain vaccines, such as: Influenza vaccine. This is recommended every year. Tetanus, diphtheria, and acellular pertussis (Tdap, Td) vaccine. You may need a Td booster every 10 years. Zoster vaccine. You may need this after age 54. Pneumococcal 13-valent conjugate (PCV13) vaccine. One dose is recommended after age 71. Pneumococcal polysaccharide (PPSV23) vaccine. One dose is recommended after age 52. Talk to your health care provider about which screenings and vaccines you need and how often you need them. This information is not intended to replace advice given to  you by your health care provider.  Make sure you discuss any questions you have with your health care provider. Document Released: 03/14/2015 Document Revised: 11/05/2015 Document Reviewed: 12/17/2014 Elsevier Interactive Patient Education  2017 [employer].  Fall Prevention in the Home Falls can cause injuries. They can happen to people of all ages. There are many things you can do to make your home safe and to help prevent falls. What can I do on the outside of my home? Regularly fix the edges of walkways and driveways and fix any cracks. Remove anything that might make you trip as you walk through a door, such as a raised step or threshold. Trim any bushes or trees on the path to your home. Use bright outdoor lighting. Clear any walking paths of anything that might make someone trip, such as rocks or tools. Regularly check to see if handrails are loose or broken. Make sure that both sides of any steps have handrails. Any raised decks and porches should have guardrails on the edges. Have any leaves, snow, or ice cleared regularly. Use sand or salt on walking paths during winter. Clean up any spills in your garage right away. This includes oil or grease spills. What can I do in the bathroom? Use night lights. Install grab bars by the toilet and in the tub and shower. Do not use towel bars as grab bars. Use non-skid mats or decals in the tub or shower. If you need to sit down in the shower, use a plastic, non-slip stool. Keep the floor dry. Clean up any water that spills on the floor as soon as it happens. Remove soap buildup in the tub or shower regularly. Attach bath mats securely with double-sided non-slip rug tape. Do not have throw rugs and other things on the floor that can make you trip. What can I do in the bedroom? Use night lights. Make sure that you have a light by your bed that is easy to reach. Do not use any sheets or blankets that are too big for your bed. They should not hang down onto the floor. Have a  firm chair that has side arms. You can use this for support while you get dressed. Do not have throw rugs and other things on the floor that can make you trip. What can I do in the kitchen? Clean up any spills right away. Avoid walking on wet floors. Keep items that you use a lot in easy-to-reach places. If you need to reach something above you, use a strong step stool that has a grab bar. Keep electrical cords out of the way. Do not use floor polish or wax that makes floors slippery. If you must use wax, use non-skid floor wax. Do not have throw rugs and other things on the floor that can make you trip. What can I do with my stairs? Do not leave any items on the stairs. Make sure that there are handrails on both sides of the stairs and use them. Fix handrails that are broken or loose. Make sure that handrails are as long as the stairways. Check any carpeting to make sure that it is firmly attached to the stairs. Fix any carpet that is loose or worn. Avoid having throw rugs at the top or bottom of the stairs. If you do have throw rugs, attach them to the floor with carpet tape. Make sure that you have a light switch at the top of the stairs and the bottom of the stairs.  If you do not have them, ask someone to add them for you. What else can I do to help prevent falls? Wear shoes that: Do not have high heels. Have rubber bottoms. Are comfortable and fit you well. Are closed at the toe. Do not wear sandals. If you use a stepladder: Make sure that it is fully opened. Do not climb a closed stepladder. Make sure that both sides of the stepladder are locked into place. Ask someone to hold it for you, if possible. Clearly mark and make sure that you can see: Any grab bars or handrails. First and last steps. Where the edge of each step is. Use tools that help you move around (mobility aids) if they are needed. These include: Canes. Walkers. Scooters. Crutches. Turn on the lights when you  go into a dark area. Replace any light bulbs as soon as they burn out. Set up your furniture so you have a clear path. Avoid moving your furniture around. If any of your floors are uneven, fix them. If there are any pets around you, be aware of where they are. Review your medicines with your doctor. Some medicines can make you feel dizzy. This can increase your chance of falling. Ask your doctor what other things that you can do to help prevent falls. This information is not intended to replace advice given to you by your health care provider. Make sure you discuss any questions you have with your health care provider. Document Released: 12/12/2008 Document Revised: 07/24/2015 Document Reviewed: 03/22/2014 Elsevier Interactive Patient Education  2017 [employer].

## 2022-11-17 NOTE — Progress Notes (Signed)
Subjective:   Emily Butler is a 67 y.o. female who presents for Medicare Annual (Subsequent) preventive examination.  Visit Complete: Virtual  I connected with  Gearlean Alf on 11/17/22 by a audio enabled telemedicine application and verified that I am speaking with the correct person using two identifiers.  Patient Location: Home  Provider Location: Home Office  I discussed the limitations of evaluation and management by telemedicine. The patient expressed understanding and agreed to proceed.  Vital Signs: Unable to obtain new vitals due to this being a telehealth visit.   Cardiac Risk Factors include: advanced age (>26men, >58 women);hypertension;family history of premature cardiovascular disease     Objective:    Today's Vitals   11/17/22 1540  PainSc: 7    There is no height or weight on file to calculate BMI.     11/17/2022    3:53 PM 08/25/2021    3:06 PM 05/17/2021    1:31 PM 01/31/2020    3:42 PM 12/21/2018    2:30 PM 06/28/2018    5:51 PM  Advanced Directives  Does Patient Have a Medical Advance Directive? No No No No No No  Would patient like information on creating a medical advance directive? No - Patient declined No - Patient declined  Yes (MAU/Ambulatory/Procedural Areas - Information given) Yes (MAU/Ambulatory/Procedural Areas - Information given)     Current Medications (verified) Outpatient Encounter Medications as of 11/17/2022  Medication Sig   diclofenac (VOLTAREN) 75 MG EC tablet Take 1 tablet (75 mg total) by mouth 2 (two) times daily as needed. Do not take every day.   loperamide (IMODIUM A-D) 2 MG tablet Take 1 tablet (2 mg total) by mouth 4 (four) times daily as needed for diarrhea or loose stools.   losartan (COZAAR) 25 MG tablet Take 1 tablet (25 mg total) by mouth daily.   omeprazole (PRILOSEC) 20 MG capsule Take 1 capsule (20 mg total) by mouth daily.   propranolol (INDERAL) 40 MG tablet Take 1 tablet (40 mg total) by mouth 2 (two) times  daily.   rOPINIRole (REQUIP) 0.25 MG tablet TAKE 1 TABLET BY MOUTH AT BEDTIME.   sertraline (ZOLOFT) 100 MG tablet TAKE 1 TABLET BY MOUTH DAILY WITH BREAKFAST.   Suvorexant (BELSOMRA) 15 MG TABS Take 1 tablet (15 mg total) by mouth at bedtime. WILL REVIEW UDS PRIOR TO REFILL   furosemide (LASIX) 20 MG tablet Take 20 mg by mouth. (Patient not taking: Reported on 11/17/2022)   Multiple Vitamin (MULTI-VITAMIN) tablet Take by mouth. (Patient not taking: Reported on 09/16/2021)   potassium chloride SA (KLOR-CON M) 20 MEQ tablet Take 1 tablet (20 mEq total) by mouth daily. Take with lasix (Patient not taking: Reported on 04/16/2022)   No facility-administered encounter medications on file as of 11/17/2022.    Allergies (verified) Duloxetine and Codeine   History: Past Medical History:  Diagnosis Date   Alcoholic (HCC)    Cirrhosis of liver (HCC)    Depression    EXCESSIVE MENSTRUATION 11/08/2006   Qualifier: Diagnosis of  By: Jillyn Hidden FNP, Mcarthur Rossetti    GERD (gastroesophageal reflux disease)    Hepatitis C 2011   Hypertension    Past Surgical History:  Procedure Laterality Date   CESAREAN SECTION     Family History  Problem Relation Age of Onset   Alcohol abuse Mother    Drug abuse Mother    Heart disease Father    Social History   Socioeconomic History   Marital status: Divorced  Spouse name: Not on file   Number of children: 4   Years of education: Not on file   Highest education level: Not on file  Occupational History   Occupation: disability  Tobacco Use   Smoking status: Former   Smokeless tobacco: Never  Vaping Use   Vaping status: Never Used  Substance and Sexual Activity   Alcohol use: Not Currently    Comment: Alcoholic quit 10 years ago   Drug use: Never   Sexual activity: Not Currently  Other Topics Concern   Not on file  Social History Narrative   Not on file   Social Determinants of Health   Financial Resource Strain: Low Risk  (11/17/2022)    Overall Financial Resource Strain (CARDIA)    Difficulty of Paying Living Expenses: Not hard at all  Food Insecurity: No Food Insecurity (11/17/2022)   Hunger Vital Sign    Worried About Running Out of Food in the Last Year: Never true    Ran Out of Food in the Last Year: Never true  Transportation Needs: No Transportation Needs (11/17/2022)   PRAPARE - Administrator, Civil Service (Medical): No    Lack of Transportation (Non-Medical): No  Physical Activity: Insufficiently Active (11/17/2022)   Exercise Vital Sign    Days of Exercise per Week: 3 days    Minutes of Exercise per Session: 40 min  Stress: No Stress Concern Present (11/17/2022)   [employer] of Occupational Health - Occupational Stress Questionnaire    Feeling of Stress : Not at all  Social Connections: Unknown (11/17/2022)   Social Connection and Isolation Panel [NHANES]    Frequency of Communication with Friends and Family: Once a week    Frequency of Social Gatherings with Friends and Family: Never    Attends Religious Services: Never    Database administrator or Organizations: No    Attends Engineer, structural: Never    Marital Status: Not on file    Tobacco Counseling Counseling given: Not Answered   Clinical Intake:  Pre-visit preparation completed: Yes  Pain : 0-10 Pain Score: 7  Pain Type: Acute pain Pain Location: Back Pain Descriptors / Indicators: Burning, Aching, Dull Pain Onset: 1 to 4 weeks ago Pain Frequency: Intermittent     Diabetes: No  How often do you need to have someone help you when you read instructions, pamphlets, or other written materials from your doctor or pharmacy?: 1 - Never  Interpreter Needed?: No  Information entered by :: Remi Haggard LPN   Activities of Daily Living    11/17/2022    3:56 PM  In your present state of health, do you have any difficulty performing the following activities:  Hearing? 0  Vision? 0  Difficulty concentrating or  making decisions? 0  Walking or climbing stairs? 1  Dressing or bathing? 0  Doing errands, shopping? 0  Preparing Food and eating ? N  Using the Toilet? N  In the past six months, have you accidently leaked urine? Y  Do you have problems with loss of bowel control? N  Managing your Medications? N  Managing your Finances? N  Housekeeping or managing your Housekeeping? N    Patient Care Team: Georgina Quint, MD as PCP - General (Internal Medicine) Diona Foley, MD as Consulting Physician (Ophthalmology)  Indicate any recent Medical Services you may have received from other than [facility] providers in the past year (date may be approximate).     Assessment:  This is a routine wellness examination for Emily Butler.  Hearing/Vision screen Hearing Screening - Comments:: No trouble hearing Vision Screening - Comments:: Not up to date [facility]   Goals Addressed             This Visit's Progress    Patient Stated       Not to have constant pain       Depression Screen    11/17/2022    4:01 PM 05/27/2022    3:21 PM 04/05/2022    2:06 PM 12/29/2021   10:20 AM 09/16/2021    2:17 PM 08/25/2021    3:28 PM 08/06/2021    2:55 PM  PHQ 2/9 Scores  PHQ - 2 Score 1  0   1 1  PHQ- 9 Score 8  0   3 3     Information is confidential and restricted. Go to Review Flowsheets to unlock data.    Fall Risk    11/17/2022    3:50 PM 04/05/2022    2:05 PM 08/25/2021    3:07 PM 08/06/2021    2:55 PM 08/04/2020    3:09 PM  Fall Risk   Falls in the past year? 1 0 1 1 1   Number falls in past yr: 1 0 1 1 0  Injury with Fall? 0 0 1 1 0  Risk for fall due to :  No Fall Risks History of fall(s);Orthopedic patient    Follow up Falls evaluation completed;Education provided;Falls prevention discussed Falls evaluation completed Falls evaluation completed  Falls evaluation completed    MEDICARE RISK AT HOME:    TIMED UP AND GO:  Was the test performed?  No    Cognitive Function:        11/17/2022     4:03 PM 08/25/2021    3:21 PM  6CIT Screen  What Year? 0 points 0 points  What month? 0 points 0 points  What time? 0 points 0 points  Count back from 20 0 points 0 points  Months in reverse 0 points 0 points  Repeat phrase 0 points 0 points  Total Score 0 points 0 points    Immunizations Immunization History  Administered Date(s) Administered   Hep A / Hep B 07/16/2011, 08/16/2011, 01/17/2012   Hepatitis B 08/10/2012, 10/04/2012   Hepatitis B, ADULT 02/14/2013   Influenza Inj Mdck Quad Pf 12/02/2016, 11/24/2017   Influenza, Seasonal, Injecte, Preservative Fre 11/14/2013   Influenza,inj,Quad PF,6+ Mos 12/24/2015, 02/03/2021, 04/05/2022   Influenza-Unspecified 11/24/2012   Pneumococcal Conjugate-13 11/06/2014   Pneumococcal Polysaccharide-23 08/10/2012, 08/04/2020   Tdap 08/10/2012   Zoster Recombinant(Shingrix) 02/01/2019    TDAP status: Due, Education has been provided regarding the importance of this vaccine. Advised may receive this vaccine at local pharmacy or Health Dept. Aware to provide a copy of the vaccination record if obtained from local pharmacy or Health Dept. Verbalized acceptance and understanding.  Flu Vaccine status: Due, Education has been provided regarding the importance of this vaccine. Advised may receive this vaccine at local pharmacy or Health Dept. Aware to provide a copy of the vaccination record if obtained from local pharmacy or Health Dept. Verbalized acceptance and understanding.  Pneumococcal vaccine status: Up to date  Covid-19 vaccine status: Declined, Education has been provided regarding the importance of this vaccine but patient still declined. Advised may receive this vaccine at local pharmacy or Health Dept.or vaccine clinic. Aware to provide a copy of the vaccination record if obtained from local pharmacy or Health Dept. Verbalized  acceptance and understanding.  Qualifies for Shingles Vaccine? Yes   Zostavax completed Yes   Shingrix  Completed?: No.    Education has been provided regarding the importance of this vaccine. Patient has been advised to call insurance company to determine out of pocket expense if they have not yet received this vaccine. Advised may also receive vaccine at local pharmacy or Health Dept. Verbalized acceptance and understanding.  Screening Tests Health Maintenance  Topic Date Due   MAMMOGRAM  11/20/2008   Cervical Cancer Screening (HPV/Pap Cotest)  12/24/2018   COVID-19 Vaccine (1) 12/03/2022 (Originally 04/24/1960)   Zoster Vaccines- Shingrix (2 of 2) 02/16/2023 (Originally 03/29/2019)   INFLUENZA VACCINE  05/30/2023 (Originally 09/30/2022)   DEXA SCAN  11/17/2023 (Originally 04/24/2022)   Medicare Annual Wellness (AWV)  11/17/2023   Colonoscopy  12/04/2023   Pneumonia Vaccine 74+ Years old (3 of 3 - PPSV23 or PCV20) 08/04/2025   Hepatitis C Screening  Completed   HIV Screening  Completed   HPV VACCINES  Aged Out   DTaP/Tdap/Td  Discontinued    Health Maintenance  Health Maintenance Due  Topic Date Due   MAMMOGRAM  11/20/2008   Cervical Cancer Screening (HPV/Pap Cotest)  12/24/2018    Colorectal cancer screening: Type of screening: Colonoscopy. Completed 2015. Repeat every 10 years  Mammogram patient will discuss with MD  Education provided  Bone Density Will discuss with MD Education provided  Lung Cancer Screening: (Low Dose CT Chest recommended if Age 29-80 years, 20 pack-year currently smoking OR have quit w/in 15years.) does not qualify.   Lung Cancer Screening Referral:   Additional Screening:  Hepatitis C Screening: does not qualify; Completed 2024  Vision Screening: Recommended annual ophthalmology exams for early detection of glaucoma and other disorders of the eye. Is the patient up to date with their annual eye exam?  No  Who is the provider or what is the name of the office in which the patient attends annual eye exams? [facility] If pt is not established with a  provider, would they like to be referred to a provider to establish care? No .   Dental Screening: Recommended annual dental exams for proper oral hygiene    Community Resource Referral / Chronic Care Management: CRR required this visit?  No   CCM required this visit?  No     Plan:     I have personally reviewed and noted the following in the patient's chart:   Medical and social history Use of alcohol, tobacco or illicit drugs  Current medications and supplements including opioid prescriptions. Patient is not currently taking opioid prescriptions. Functional ability and status Nutritional status Physical activity Advanced directives List of other physicians Hospitalizations, surgeries, and ER visits in previous 12 months Vitals Screenings to include cognitive, depression, and falls Referrals and appointments  In addition, I have reviewed and discussed with patient certain preventive protocols, quality metrics, and best practice recommendations. A written personalized care plan for preventive services as well as general preventive health recommendations were provided to patient.     Remi Haggard, LPN   03/31/8655   After Visit Summary: (MyChart) Due to this being a telephonic visit, the after visit summary with patients personalized plan was offered to patient via MyChart   Nurse Notes:

## 2022-11-22 ENCOUNTER — Other Ambulatory Visit: Payer: Self-pay | Admitting: Emergency Medicine

## 2022-11-22 DIAGNOSIS — Z760 Encounter for issue of repeat prescription: Secondary | ICD-10-CM

## 2022-11-22 DIAGNOSIS — K219 Gastro-esophageal reflux disease without esophagitis: Secondary | ICD-10-CM

## 2022-12-16 ENCOUNTER — Telehealth: Payer: Self-pay | Admitting: Emergency Medicine

## 2022-12-16 NOTE — Telephone Encounter (Signed)
Pt is in the process of TOC to [facility].  She's just wondering if Dr Alvy Bimler could prescribe her some cough syrup or meds for her bad cough. its causing her to choke to the point where she could barely breath. any over the counters you're recommending or can a rx can be ordered?

## 2022-12-16 NOTE — Telephone Encounter (Deleted)
Pt is in the process of TOC to [facility].  She's just wondering if Dr Alvy Bimler could prescribe her some cough syrup or meds for her bad cough. its causing her to choke to the point where she could barely breath. any over the counters you're recommending or can a rx can be ordered?

## 2022-12-17 NOTE — Telephone Encounter (Signed)
Called patient and informed her that we had no opening today for a visit. Patient states she is not feeling well, having Covid like symptoms. Informed patient that if she felt worse today that she needs to go to the urgent care. Patient states due to transportation she is unable to go. I informed her that we would be able to see her on Tuesday.  Pt is scheduled for a VV due to transportation issues. I also informed patient that if she is not better or if she gets worse over the weekend to go to the nearest urgent care.

## 2022-12-21 ENCOUNTER — Telehealth: Payer: Medicare Other | Admitting: Emergency Medicine

## 2023-01-04 ENCOUNTER — Other Ambulatory Visit: Payer: Self-pay | Admitting: Emergency Medicine

## 2023-01-04 DIAGNOSIS — M2550 Pain in unspecified joint: Secondary | ICD-10-CM

## 2023-01-06 ENCOUNTER — Ambulatory Visit (INDEPENDENT_AMBULATORY_CARE_PROVIDER_SITE_OTHER): Payer: Medicare Other | Admitting: Family Medicine

## 2023-01-06 ENCOUNTER — Encounter: Payer: Self-pay | Admitting: Family Medicine

## 2023-01-06 VITALS — BP 110/70 | HR 65 | Temp 97.8°F | Ht 63.0 in | Wt 181.2 lb

## 2023-01-06 DIAGNOSIS — N644 Mastodynia: Secondary | ICD-10-CM | POA: Diagnosis not present

## 2023-01-06 DIAGNOSIS — R051 Acute cough: Secondary | ICD-10-CM | POA: Diagnosis not present

## 2023-01-06 MED ORDER — PROMETHAZINE-DM 6.25-15 MG/5ML PO SYRP: 5.0000 mL | ORAL_SOLUTION | Freq: Every evening | ORAL | 0 refills | Status: DC | PRN

## 2023-01-06 MED ORDER — AZITHROMYCIN 250 MG PO TABS: ORAL_TABLET | ORAL | 0 refills | Status: DC

## 2023-01-06 MED ORDER — PREDNISONE 10 MG PO TABS: ORAL_TABLET | ORAL | 0 refills | Status: DC

## 2023-01-06 NOTE — Progress Notes (Signed)
Patient ID: Emily Butler, female    DOB: Mar 21, 1955, 67 y.o.   MRN: [id number]  This visit was conducted in person.  BP 110/70 (BP Location: Left Arm, Patient Position: Sitting, Cuff Size: Large)   Pulse 65   Temp 97.8 F (36.6 C) (Temporal)   Ht 5\' 3"  (1.6 m)   Wt 181 lb 4 oz (82.2 kg)   SpO2 96%   BMI 32.11 kg/m    CC:  Chief Complaint  Patient presents with   Cough    Started the beginning of October-Hurts to take deep breaths   Breast Pain    Subjective:   HPI: Emily Butler is a 67 y.o. female presenting on 01/06/2023 for Cough (Started the beginning of October-Hurts to take deep breaths) and Breast Pain   Date of onset:  12/02/2022 Initial symptoms included  hoarse voice, post nasal drip Symptoms progressed to deep cough, ST   Now cough is more dry, but with coughs having chest wall pain  She has been having right breast  pain.Marland Kitchen no masses Hx of fibrocystic breast disease.   Having intermittent diarrhea    No SOB, no fever.   Sick contacts:  none COVID testing:    none    She has tried to treat with  hot tea and honey    No history of chronic lung disease such as asthma or COPD. Former remote smoker.   Last mammogram 2008!    PDMP reviewed during this encounter.   Relevant past medical, surgical, family and social history reviewed and updated as indicated. Interim medical history since our last visit reviewed. Allergies and medications reviewed and updated. Outpatient Medications Prior to Visit  Medication Sig Dispense Refill   diclofenac (VOLTAREN) 75 MG EC tablet TAKE 1 TABLET (75 MG TOTAL) BY MOUTH 2 (TWO) TIMES DAILY AS NEEDED. DO NOT TAKE EVERY DAY. 30 tablet 0   furosemide (LASIX) 20 MG tablet Take 20 mg by mouth daily as needed.     losartan (COZAAR) 25 MG tablet Take 1 tablet (25 mg total) by mouth daily. 30 tablet 0   Multiple Vitamin (MULTI-VITAMIN) tablet Take by mouth.     omeprazole (PRILOSEC) 20 MG capsule Take 1 capsule (20 mg  total) by mouth daily. 30 capsule 0   potassium chloride SA (KLOR-CON M) 20 MEQ tablet Take 20 mEq by mouth daily as needed. Take with Lasix     propranolol (INDERAL) 40 MG tablet Take 1 tablet (40 mg total) by mouth 2 (two) times daily. 60 tablet 0   rOPINIRole (REQUIP) 0.25 MG tablet TAKE 1 TABLET BY MOUTH AT BEDTIME. 90 tablet 0   sertraline (ZOLOFT) 100 MG tablet TAKE 1 TABLET BY MOUTH DAILY WITH BREAKFAST. 90 tablet 0   Suvorexant (BELSOMRA) 15 MG TABS Take 1 tablet (15 mg total) by mouth at bedtime. WILL REVIEW UDS PRIOR TO REFILL 30 tablet 1   furosemide (LASIX) 20 MG tablet Take 20 mg by mouth. (Patient not taking: Reported on 11/17/2022)     loperamide (IMODIUM A-D) 2 MG tablet Take 1 tablet (2 mg total) by mouth 4 (four) times daily as needed for diarrhea or loose stools. 30 tablet 1   potassium chloride SA (KLOR-CON M) 20 MEQ tablet Take 1 tablet (20 mEq total) by mouth daily. Take with lasix (Patient not taking: Reported on 04/16/2022) 90 tablet 3   No facility-administered medications prior to visit.     Per HPI unless specifically indicated in ROS  section below Review of Systems  Constitutional:  Negative for fatigue and fever.  HENT:  Negative for congestion.   Eyes:  Negative for pain.  Respiratory:  Negative for cough and shortness of breath.   Cardiovascular:  Negative for chest pain, palpitations and leg swelling.  Gastrointestinal:  Negative for abdominal pain.  Genitourinary:  Negative for dysuria and vaginal bleeding.  Musculoskeletal:  Negative for back pain.  Neurological:  Negative for syncope, light-headedness and headaches.  Psychiatric/Behavioral:  Negative for dysphoric mood.    Objective:  BP 110/70 (BP Location: Left Arm, Patient Position: Sitting, Cuff Size: Large)   Pulse 65   Temp 97.8 F (36.6 C) (Temporal)   Ht 5\' 3"  (1.6 m)   Wt 181 lb 4 oz (82.2 kg)   SpO2 96%   BMI 32.11 kg/m   Wt Readings from Last 3 Encounters:  01/06/23 181 lb 4 oz (82.2  kg)  04/05/22 186 lb (84.4 kg)  08/06/21 185 lb (83.9 kg)      Physical Exam Exam conducted with a chaperone present.  Constitutional:      General: She is not in acute distress.    Appearance: She is well-developed. She is not ill-appearing or toxic-appearing.  HENT:     Head: Normocephalic.     Right Ear: Hearing, tympanic membrane, ear canal and external ear normal. Tympanic membrane is not erythematous, retracted or bulging.     Left Ear: Hearing, tympanic membrane, ear canal and external ear normal. Tympanic membrane is not erythematous, retracted or bulging.     Nose: Mucosal edema and rhinorrhea present.     Right Sinus: No maxillary sinus tenderness or frontal sinus tenderness.     Left Sinus: No maxillary sinus tenderness or frontal sinus tenderness.     Mouth/Throat:     Mouth: Oropharynx is clear and moist and mucous membranes are normal.     Pharynx: Uvula midline.  Eyes:     General: Lids are normal. Lids are everted, no foreign bodies appreciated.     Extraocular Movements: EOM normal.     Conjunctiva/sclera: Conjunctivae normal.     Pupils: Pupils are equal, round, and reactive to light.  Neck:     Thyroid: No thyroid mass or thyromegaly.     Vascular: No carotid bruit.     Trachea: Trachea normal.  Cardiovascular:     Rate and Rhythm: Normal rate and regular rhythm.     Pulses: Normal pulses.     Heart sounds: Normal heart sounds, S1 normal and S2 normal. No murmur heard.    No friction rub. No gallop.  Pulmonary:     Effort: Pulmonary effort is normal. No tachypnea or respiratory distress.     Breath sounds: Normal breath sounds. No decreased breath sounds, wheezing, rhonchi or rales.  Chest:     Chest wall: No mass.  Breasts:    Breasts are symmetrical.     Right: Normal.     Left: Normal.  Musculoskeletal:     Cervical back: Normal range of motion and neck supple.  Lymphadenopathy:     Upper Body:     Right upper body: No supraclavicular, axillary or  pectoral adenopathy.     Left upper body: No supraclavicular, axillary or pectoral adenopathy.  Skin:    General: Skin is warm, dry and intact.     Findings: No rash.  Neurological:     Mental Status: She is alert.  Psychiatric:        Mood  and Affect: Mood is not anxious or depressed.        Speech: Speech normal.        Behavior: Behavior normal. Behavior is cooperative.        Cognition and Memory: Cognition and memory normal.        Judgment: Judgment normal.       Results for orders placed or performed in visit on 04/05/22  Comprehensive metabolic panel  Result Value Ref Range   Sodium 138 135 - 145 mEq/L   Potassium 4.9 3.5 - 5.1 mEq/L   Chloride 103 96 - 112 mEq/L   CO2 25 19 - 32 mEq/L   Glucose, Bld 127 (H) 70 - 99 mg/dL   BUN 35 (H) 6 - 23 mg/dL   Creatinine, Ser 5.62 0.40 - 1.20 mg/dL   Total Bilirubin 1.3 (H) 0.2 - 1.2 mg/dL   Alkaline Phosphatase 92 39 - 117 U/L   AST 28 0 - 37 U/L   ALT 21 0 - 35 U/L   Total Protein 7.3 6.0 - 8.3 g/dL   Albumin 4.8 3.5 - 5.2 g/dL   GFR 13.08 (L) >65.78 mL/min   Calcium 9.7 8.4 - 10.5 mg/dL  Lipid panel  Result Value Ref Range   Cholesterol 234 (H) 0 - 200 mg/dL   Triglycerides 469.6 (H) 0.0 - 149.0 mg/dL   HDL 29.52 >84.13 mg/dL   VLDL 24.4 0.0 - 01.0 mg/dL   LDL Cholesterol 272 (H) 0 - 99 mg/dL   Total CHOL/HDL Ratio 4    NonHDL 177.46     Assessment and Plan  Breast pain Assessment & Plan: Acute, discussed decreasing caffeine intake and increasing hydration. Will evaluate further with bilateral diagnostic mammogram and ultrasound.  Orders: -     [place] LIMITED ULTRASOUND INCLUDING AXILLA RIGHT BREAST; Future -     [place] LIMITED ULTRASOUND INCLUDING AXILLA LEFT BREAST ; Future -     MM 3D DIAGNOSTIC MAMMOGRAM BILATERAL BREAST; Future  Acute cough Assessment & Plan:   Acute , now ongoing > 7-10 days..  Concerning for bacterial superinfection on top of viral infection.  Treat with azithromycin course, prednisone taper  and cough for suppressant at night.  Rest and fluids encouraged.  Return and ER precautions provided.   Other orders -     Azithromycin; 2 tab po x 1 day then 1 tab po daily  Dispense: 6 tablet; Refill: 0 -     predniSONE; 3 tabs by mouth daily x 3 days, then 2 tabs by mouth daily x 2 days then 1 tab by mouth daily x 2 days  Dispense: 15 tablet; Refill: 0 -     Promethazine-DM; Take 5 mLs by mouth at bedtime as needed for cough.  Dispense: 118 mL; Refill: 0    No follow-ups on file.   Kerby Nora, MD

## 2023-01-06 NOTE — Patient Instructions (Signed)
Start prednisone taper and suppressant  for cough.  If not improving , fill rx for antibiotics.  Push, fluids, rest.

## 2023-01-19 DIAGNOSIS — N644 Mastodynia: Secondary | ICD-10-CM | POA: Insufficient documentation

## 2023-01-19 DIAGNOSIS — R051 Acute cough: Secondary | ICD-10-CM | POA: Insufficient documentation

## 2023-01-19 NOTE — Assessment & Plan Note (Signed)
Acute , now ongoing > 7-10 days..  Concerning for bacterial superinfection on top of viral infection.  Treat with azithromycin course, prednisone taper and cough for suppressant at night.  Rest and fluids encouraged.  Return and ER precautions provided.

## 2023-01-19 NOTE — Assessment & Plan Note (Signed)
Acute, discussed decreasing caffeine intake and increasing hydration. Will evaluate further with bilateral diagnostic mammogram and ultrasound.

## 2023-01-28 ENCOUNTER — Other Ambulatory Visit: Payer: Self-pay | Admitting: Emergency Medicine

## 2023-01-28 DIAGNOSIS — I10 Essential (primary) hypertension: Secondary | ICD-10-CM

## 2023-01-28 DIAGNOSIS — Z760 Encounter for issue of repeat prescription: Secondary | ICD-10-CM

## 2023-02-15 ENCOUNTER — Other Ambulatory Visit: Payer: Self-pay | Admitting: Emergency Medicine

## 2023-02-15 DIAGNOSIS — K219 Gastro-esophageal reflux disease without esophagitis: Secondary | ICD-10-CM

## 2023-02-15 DIAGNOSIS — M2550 Pain in unspecified joint: Secondary | ICD-10-CM

## 2023-02-15 DIAGNOSIS — Z760 Encounter for issue of repeat prescription: Secondary | ICD-10-CM

## 2023-03-03 ENCOUNTER — Encounter: Payer: Self-pay | Admitting: *Deleted

## 2023-03-03 ENCOUNTER — Encounter: Payer: Self-pay | Admitting: Family

## 2023-03-03 ENCOUNTER — Ambulatory Visit: Payer: Medicare Other | Admitting: Family

## 2023-03-03 VITALS — BP 116/80 | HR 65 | Temp 97.6°F | Ht 63.0 in | Wt 185.2 lb

## 2023-03-03 DIAGNOSIS — I4891 Unspecified atrial fibrillation: Secondary | ICD-10-CM | POA: Diagnosis not present

## 2023-03-03 DIAGNOSIS — Z79899 Other long term (current) drug therapy: Secondary | ICD-10-CM

## 2023-03-03 DIAGNOSIS — F3340 Major depressive disorder, recurrent, in remission, unspecified: Secondary | ICD-10-CM | POA: Insufficient documentation

## 2023-03-03 DIAGNOSIS — R739 Hyperglycemia, unspecified: Secondary | ICD-10-CM

## 2023-03-03 DIAGNOSIS — T781XXA Other adverse food reactions, not elsewhere classified, initial encounter: Secondary | ICD-10-CM

## 2023-03-03 DIAGNOSIS — Z8619 Personal history of other infectious and parasitic diseases: Secondary | ICD-10-CM | POA: Insufficient documentation

## 2023-03-03 DIAGNOSIS — K7030 Alcoholic cirrhosis of liver without ascites: Secondary | ICD-10-CM | POA: Diagnosis not present

## 2023-03-03 DIAGNOSIS — K219 Gastro-esophageal reflux disease without esophagitis: Secondary | ICD-10-CM | POA: Diagnosis not present

## 2023-03-03 DIAGNOSIS — F3342 Major depressive disorder, recurrent, in full remission: Secondary | ICD-10-CM

## 2023-03-03 DIAGNOSIS — F1011 Alcohol abuse, in remission: Secondary | ICD-10-CM | POA: Insufficient documentation

## 2023-03-03 DIAGNOSIS — G8929 Other chronic pain: Secondary | ICD-10-CM | POA: Insufficient documentation

## 2023-03-03 DIAGNOSIS — G2581 Restless legs syndrome: Secondary | ICD-10-CM

## 2023-03-03 DIAGNOSIS — F1021 Alcohol dependence, in remission: Secondary | ICD-10-CM | POA: Diagnosis not present

## 2023-03-03 DIAGNOSIS — F322 Major depressive disorder, single episode, severe without psychotic features: Secondary | ICD-10-CM

## 2023-03-03 DIAGNOSIS — N1831 Chronic kidney disease, stage 3a: Secondary | ICD-10-CM

## 2023-03-03 DIAGNOSIS — T7819XA Other adverse food reactions, not elsewhere classified, initial encounter: Secondary | ICD-10-CM

## 2023-03-03 DIAGNOSIS — R0683 Snoring: Secondary | ICD-10-CM

## 2023-03-03 DIAGNOSIS — I10 Essential (primary) hypertension: Secondary | ICD-10-CM

## 2023-03-03 DIAGNOSIS — E782 Mixed hyperlipidemia: Secondary | ICD-10-CM | POA: Diagnosis not present

## 2023-03-03 DIAGNOSIS — K429 Umbilical hernia without obstruction or gangrene: Secondary | ICD-10-CM

## 2023-03-03 DIAGNOSIS — D696 Thrombocytopenia, unspecified: Secondary | ICD-10-CM

## 2023-03-03 DIAGNOSIS — G4710 Hypersomnia, unspecified: Secondary | ICD-10-CM

## 2023-03-03 DIAGNOSIS — R0609 Other forms of dyspnea: Secondary | ICD-10-CM

## 2023-03-03 LAB — CBC WITH DIFFERENTIAL/PLATELET
Basophils Absolute: 0.1 10*3/uL (ref 0.0–0.1)
Basophils Relative: 0.9 % (ref 0.0–3.0)
Eosinophils Absolute: 0.2 10*3/uL (ref 0.0–0.7)
Eosinophils Relative: 3.5 % (ref 0.0–5.0)
HCT: 41.7 % (ref 36.0–46.0)
Hemoglobin: 14.3 g/dL (ref 12.0–15.0)
Lymphocytes Relative: 27 % (ref 12.0–46.0)
Lymphs Abs: 1.7 10*3/uL (ref 0.7–4.0)
MCHC: 34.3 g/dL (ref 30.0–36.0)
MCV: 97.3 fL (ref 78.0–100.0)
Monocytes Absolute: 0.5 10*3/uL (ref 0.1–1.0)
Monocytes Relative: 7.5 % (ref 3.0–12.0)
Neutro Abs: 3.8 10*3/uL (ref 1.4–7.7)
Neutrophils Relative %: 61.1 % (ref 43.0–77.0)
Platelets: 107 10*3/uL — ABNORMAL LOW (ref 150.0–400.0)
RBC: 4.29 Mil/uL (ref 3.87–5.11)
RDW: 13.1 % (ref 11.5–15.5)
WBC: 6.2 10*3/uL (ref 4.0–10.5)

## 2023-03-03 LAB — COMPREHENSIVE METABOLIC PANEL
ALT: 20 U/L (ref 0–35)
AST: 31 U/L (ref 0–37)
Albumin: 4.7 g/dL (ref 3.5–5.2)
Alkaline Phosphatase: 92 U/L (ref 39–117)
BUN: 25 mg/dL — ABNORMAL HIGH (ref 6–23)
CO2: 25 meq/L (ref 19–32)
Calcium: 9.7 mg/dL (ref 8.4–10.5)
Chloride: 105 meq/L (ref 96–112)
Creatinine, Ser: 0.78 mg/dL (ref 0.40–1.20)
GFR: 79.46 mL/min (ref >60.00)
Glucose, Bld: 122 mg/dL — ABNORMAL HIGH (ref 70–99)
Potassium: 3.9 meq/L (ref 3.5–5.1)
Sodium: 140 meq/L (ref 135–145)
Total Bilirubin: 1.2 mg/dL (ref 0.2–1.2)
Total Protein: 6.7 g/dL (ref 6.0–8.3)

## 2023-03-03 LAB — LIPID PANEL
Cholesterol: 203 mg/dL — ABNORMAL HIGH (ref 0–200)
HDL: 60.2 mg/dL (ref >39.00)
LDL Cholesterol: 114 mg/dL — ABNORMAL HIGH (ref 0–99)
NonHDL: 143.09
Total CHOL/HDL Ratio: 3
Triglycerides: 143 mg/dL (ref 0.0–149.0)
VLDL: 28.6 mg/dL (ref 0.0–40.0)

## 2023-03-03 LAB — HEMOGLOBIN A1C: Hgb A1c MFr Bld: 5.5 % (ref 4.6–6.5)

## 2023-03-03 MED ORDER — ASPIRIN 81 MG PO TBEC: 81.0000 mg | DELAYED_RELEASE_TABLET | Freq: Every day | ORAL | Status: DC

## 2023-03-03 MED ORDER — PANTOPRAZOLE SODIUM 20 MG PO TBEC: 20.0000 mg | DELAYED_RELEASE_TABLET | Freq: Every day | ORAL | 0 refills | Status: DC

## 2023-03-03 MED ORDER — SERTRALINE HCL 100 MG PO TABS: ORAL_TABLET | ORAL | 0 refills | Status: DC

## 2023-03-03 NOTE — Patient Instructions (Addendum)
  A referral was placed today for general surgeon and liver specialist Please let us  know if you have not heard back within 2 weeks about the referral.  A referral was placed today for psychology.  Please let us  know if you have not heard back within 2 weeks about the referral.  Sleep study was ordered should hear something in the next few weeks.

## 2023-03-03 NOTE — Progress Notes (Signed)
 Established Patient Office Visit  Subjective:  Patient ID: Emily Butler, female    DOB: [birth year]  Age: 68 y.o. MRN: [id number]  CC:  Chief Complaint  Patient presents with   Establish Care    HPI [name] is here for a transition of care visit.  Prior provider was: Dr. Purcell in [place]  Pt is with acute concerns.   Transportation, she does not have a car but she does use an E-bike to get around. She states hard for her to afford a car so she is doing well with just using ebike.   Afib: last seen a cardiologist in 2011. She states she was told to only follow up prn. She is not on a blood thinner. She is on propanolol 40 mg twice daily. No recent echocardiogram. No chest pain however does admit to DOE.  Cirrhosis: has been treated in the past for hepatitis, was an alcohol abuser of which she states she has not had since 2011. She admits she takes tylenol regularly. She was supposed to follow up in six months back in 2015 but never did follow up. She was taking furosemide  60 mg daily back in 2015 due to ascites, but she couldn't take spironolactone due to hyperkalemia that was persistent. Per PCP notes during her last visit 08/06/21 referral had been placed to GI in 2022 however pt did not schedule appt. Admitted in 2013 for abdominal pain, with cirrhosis with history of portal hypertension per note, and was taking furosemide  40 mg once daily and spironolactone at that time.  Insomnia, admits to taking 'tons' of benedryl and tylenol pm to even get to sleep. She takes requip  as well which only helps slightly.   Depression, not controlled. A lot of stressful factors in her life. Was seeing psychiatrist however stopped going because she did not want to get urine drug screen, and states there was just not good communication with the provider. Was seeing Dr. Eappen. Is on sertraline  100 mg however she states she wants to increase the dosage. She does at times have SI but without  current plan at this time.   Chronic pain, she is on voltaren  75 mg once daily however she was advised by her pcp in the past not take daily due to CKD, with slightly worsening GFR in the past lab work.   Snoring, hard to sleep at ni=ght. Was recommended to get sleep study in the past but didn't proceed.     (Write in time took in office 65 minutes, check into blood thinner with cirrhosis, diclofenac  not advised with cirrhosis, is sertraline  ok with cirrhosis, does she need to be on a diuretic?)  Past Medical History:  Diagnosis Date   Alcoholic (HCC)    Cirrhosis of liver (HCC)    Depression    EXCESSIVE MENSTRUATION 11/08/2006   Qualifier: Diagnosis of  By: Baird FNP, Frieda Kipper    GERD (gastroesophageal reflux disease)    Hepatitis C 2011   Hypertension     Past Surgical History:  Procedure Laterality Date   CESAREAN SECTION      Family History  Problem Relation Age of Onset   Alcohol abuse Mother    Drug abuse Mother    Heart disease Father     Social History   Socioeconomic History   Marital status: Divorced    Spouse name: Not on file   Number of children: 4   Years of education: Not on file   Highest  education level: Not on file  Occupational History   Occupation: disability  Tobacco Use   Smoking status: Former   Smokeless tobacco: Never  Vaping Use   Vaping status: Never Used  Substance and Sexual Activity   Alcohol use: Not Currently    Comment: Alcoholic quit 10 years ago   Drug use: Not Currently    Types: Cocaine   Sexual activity: Not Currently  Other Topics Concern   Not on file  Social History Narrative   Not on file   Social Drivers of Health   Financial Resource Strain: Low Risk  (11/17/2022)   Overall Financial Resource Strain (CARDIA)    Difficulty of Paying Living Expenses: Not hard at all  Food Insecurity: No Food Insecurity (11/17/2022)   Hunger Vital Sign    Worried About Running Out of Food in the Last Year: Never true     Ran Out of Food in the Last Year: Never true  Transportation Needs: No Transportation Needs (11/17/2022)   PRAPARE - Administrator, Civil Service (Medical): No    Lack of Transportation (Non-Medical): No  Physical Activity: Insufficiently Active (11/17/2022)   Exercise Vital Sign    Days of Exercise per Week: 3 days    Minutes of Exercise per Session: 40 min  Stress: No Stress Concern Present (11/17/2022)   [employer] of Occupational Health - Occupational Stress Questionnaire    Feeling of Stress : Not at all  Social Connections: Unknown (11/17/2022)   Social Connection and Isolation Panel [NHANES]    Frequency of Communication with Friends and Family: Once a week    Frequency of Social Gatherings with Friends and Family: Never    Attends Religious Services: Never    Database Administrator or Organizations: No    Attends Banker Meetings: Never    Marital Status: Not on file  Intimate Partner Violence: Not At Risk (11/17/2022)   Humiliation, Afraid, Rape, and Kick questionnaire    Fear of Current or Ex-Partner: No    Emotionally Abused: No    Physically Abused: No    Sexually Abused: No    Outpatient Medications Prior to Visit  Medication Sig Dispense Refill   losartan  (COZAAR ) 25 MG tablet TAKE 1 TABLET (25 MG TOTAL) BY MOUTH DAILY. 90 tablet 1   Multiple Vitamin (MULTI-VITAMIN) tablet Take by mouth.     propranolol  (INDERAL ) 40 MG tablet Take 1 tablet (40 mg total) by mouth 2 (two) times daily. 60 tablet 0   rOPINIRole  (REQUIP ) 0.25 MG tablet TAKE 1 TABLET BY MOUTH AT BEDTIME. 90 tablet 0   diclofenac  (VOLTAREN ) 75 MG EC tablet TAKE 1 TABLET (75 MG TOTAL) BY MOUTH 2 (TWO) TIMES DAILY AS NEEDED. DO NOT TAKE EVERY DAY. 30 tablet 0   furosemide  (LASIX ) 20 MG tablet Take 20 mg by mouth daily as needed.     omeprazole  (PRILOSEC) 20 MG capsule TAKE 1 CAPSULE BY MOUTH EVERY DAY 90 capsule 1   potassium chloride  SA (KLOR-CON  M) 20 MEQ tablet Take 20 mEq by  mouth daily as needed. Take with Lasix      sertraline  (ZOLOFT ) 100 MG tablet TAKE 1 TABLET BY MOUTH DAILY WITH BREAKFAST. 90 tablet 0   Suvorexant  (BELSOMRA ) 15 MG TABS Take 1 tablet (15 mg total) by mouth at bedtime. WILL REVIEW UDS PRIOR TO REFILL 30 tablet 1   azithromycin  (ZITHROMAX ) 250 MG tablet 2 tab po x 1 day then 1 tab po daily 6 tablet  0   predniSONE  (DELTASONE ) 10 MG tablet 3 tabs by mouth daily x 3 days, then 2 tabs by mouth daily x 2 days then 1 tab by mouth daily x 2 days 15 tablet 0   promethazine -dextromethorphan (PROMETHAZINE -DM) 6.25-15 MG/5ML syrup Take 5 mLs by mouth at bedtime as needed for cough. 118 mL 0   No facility-administered medications prior to visit.    Allergies  Allergen Reactions   Duloxetine Other (See Comments) and Nausea Only    ROS Review of Systems  ROS: Pertinent symptoms negative unless otherwise noted in HPI  .    Objective:    Physical Exam Vitals reviewed.  Constitutional:      General: She is not in acute distress.    Appearance: Normal appearance. She is not ill-appearing or toxic-appearing.  HENT:     Right Ear: Tympanic membrane normal.     Left Ear: Tympanic membrane normal.     Mouth/Throat:     Mouth: Mucous membranes are moist.     Pharynx: No pharyngeal swelling.     Tonsils: No tonsillar exudate.  Eyes:     General:        Right eye: No discharge.        Left eye: No discharge.     Extraocular Movements: Extraocular movements intact.     Conjunctiva/sclera: Conjunctivae normal.     Pupils: Pupils are equal, round, and reactive to light.  Neck:     Thyroid : No thyroid  mass.  Cardiovascular:     Rate and Rhythm: Normal rate and regular rhythm.  Pulmonary:     Effort: Pulmonary effort is normal. No respiratory distress.     Breath sounds: Normal breath sounds.  Abdominal:     General: Abdomen is flat. Bowel sounds are normal.     Palpations: Abdomen is soft.     Tenderness: There is abdominal tenderness in the  periumbilical area and left upper quadrant.     Hernia: A hernia is present. Hernia is present in the umbilical area (some tenderness, reducible).  Musculoskeletal:        General: Normal range of motion.     Cervical back: Normal range of motion.  Lymphadenopathy:     Cervical:     Right cervical: No superficial cervical adenopathy.    Left cervical: No superficial cervical adenopathy.  Skin:    General: Skin is warm.     Capillary Refill: Capillary refill takes less than 2 seconds.  Neurological:     General: No focal deficit present.     Mental Status: She is alert and oriented to person, place, and time. Mental status is at baseline.  Psychiatric:        Mood and Affect: Mood normal.        Behavior: Behavior normal.        Thought Content: Thought content normal.        Judgment: Judgment normal.      BP 116/80 (BP Location: Left Arm, Patient Position: Sitting, Cuff Size: Normal)   Pulse 65   Temp 97.6 F (36.4 C) (Temporal)   Ht 5' 3 (1.6 m)   Wt 185 lb 3.2 oz (84 kg)   SpO2 96%   BMI 32.81 kg/m  Wt Readings from Last 3 Encounters:  03/03/23 185 lb 3.2 oz (84 kg)  01/06/23 181 lb 4 oz (82.2 kg)  04/05/22 186 lb (84.4 kg)     Health Maintenance Due  Topic Date Due   COVID-19 Vaccine (  1) Never done   MAMMOGRAM  11/20/2008   Cervical Cancer Screening (HPV/Pap Cotest)  12/24/2018   Zoster Vaccines- Shingrix (2 of 2) 03/29/2019    There are no preventive care reminders to display for this patient.  Lab Results  Component Value Date   TSH 2.12 08/06/2021   Lab Results  Component Value Date   WBC 6.2 03/03/2023   HGB 14.3 03/03/2023   HCT 41.7 03/03/2023   MCV 97.3 03/03/2023   PLT 107.0 (L) 03/03/2023   Lab Results  Component Value Date   NA 140 03/03/2023   K 3.9 03/03/2023   CO2 25 03/03/2023   GLUCOSE 122 (H) 03/03/2023   BUN 25 (H) 03/03/2023   CREATININE 0.78 03/03/2023   BILITOT 1.2 03/03/2023   ALKPHOS 92 03/03/2023   AST 31 03/03/2023    ALT 20 03/03/2023   PROT 6.7 03/03/2023   ALBUMIN 4.7 03/03/2023   CALCIUM 9.7 03/03/2023   ANIONGAP 11 06/28/2018   GFR 79.46 03/03/2023   Lab Results  Component Value Date   CHOL 203 (H) 03/03/2023   Lab Results  Component Value Date   HDL 60.20 03/03/2023   Lab Results  Component Value Date   LDLCALC 114 (H) 03/03/2023   Lab Results  Component Value Date   TRIG 143.0 03/03/2023   Lab Results  Component Value Date   CHOLHDL 3 03/03/2023   Lab Results  Component Value Date   HGBA1C 5.5 03/03/2023      Assessment & Plan:   History of alcohol abuse  History of hepatitis C  Alcohol use disorder, moderate, in sustained remission (HCC) Assessment & Plan: Encouraged for cessation.   Orders: -     DRUG MONITORING, PANEL 8 WITH CONFIRMATION, URINE  Atrial fibrillation, unspecified type (HCC) Assessment & Plan: Not auscultated today on exam.  Pt to continue propanolol 40 mg twice daily Pt has been referred to cardiology pending appt.  Concern is not being on blood thinner, will await workup as pt also with cirrhosis and has been non compliant with follow up. Notes are limited. Has been referred to hepatology as well.  Echocardiogram ordered pending results, DOE.  Orders: -     ECHOCARDIOGRAM COMPLETE; Future  Primary hypertension Assessment & Plan: Controlled on todays exam. Continue losartan  25 mg once daily   Gastroesophageal reflux disease without esophagitis Assessment & Plan: Try to decrease and or avoid spicy foods, fried fatty foods, and also caffeine and chocolate as these can increase heartburn symptoms.    Orders: -     Pantoprazole  Sodium; Take 1 tablet (20 mg total) by mouth daily.  Dispense: 90 tablet; Refill: 0  Umbilical hernia without obstruction and without gangrene Assessment & Plan: Discussed red flag symptoms with patient. There is tenderness on palpation, reducible however prominent.  Referral placed to general surgeon for  further evaluation.  Orders: -     Ambulatory referral to General Surgery  Alcoholic cirrhosis of liver without ascites (HCC) Assessment & Plan: Non compliant for some time with follow up Sending in referral for consult with hepatology for ongoing evaluation/treatment.  Will pend workup with cardiology as well as hepatology to determine if blood thinner can be utilized with cirrhosis.  Ordering LFTs today as well to check stability.  Pt currently asymptomatic.   Did d/w risk of use of diclofenac , cox 2 inhibitor. Advisement is againest use of cox 2 in patients with advanced CLD and or cirrhosis. If used could consider possible 50% dose reduction  child pugh class b cirrhosis.   Did also discuss NSAIDS should be avoided.  Will send in furosemide  20 mg once daily prn fluid (at current on physical exam no ascites present and history unclear). Notes do state had to be d/c on spironolactone because of hyperkalemia even with concurrent lasix  use.  Orders: -     Comprehensive metabolic panel -     Amb Referral to Hepatology -     Furosemide ; Take 1 tablet (20 mg total) by mouth daily as needed.  Dispense: 30 tablet; Refill: 3  Hypersomnia Assessment & Plan: With insomnia at night, hard for her to sleep.  Could consider belsomra  again however limited studies with liver impairment. Ok with kidney impairment. UDS today.   Decreased platelet count (HCC) -     CBC with Differential/Platelet  Hyperglycemia Assessment & Plan: Pt advised of the following: Work on a diabetic diet, try to incorporate exercise at least 20-30 a day for 3 days a week or more.    Orders: -     Hemoglobin A1c  Mixed hyperlipidemia Assessment & Plan: Ordered lipid panel, pending results. Work on low cholesterol diet and exercise as tolerated   Orders: -     Lipid panel  Gastrointestinal food sensitivity -     Alpha-Gal Panel  High risk medication use -     DRUG MONITORING, PANEL 8 WITH CONFIRMATION,  URINE  DOE (dyspnea on exertion) Assessment & Plan: Echocardiogram ordered to r/u heart disease   Orders: -     ECHOCARDIOGRAM COMPLETE; Future  Other chronic pain  Snoring -     Ambulatory referral to Sleep Studies  Stage 3a chronic kidney disease (HCC) Assessment & Plan: Repeating today kidney function panel, pending results.    Restless legs syndrome (RLS) Assessment & Plan: Continue requip  0.25 mg nightly   Current severe episode of major depressive disorder without psychotic features, unspecified whether recurrent (HCC) Assessment & Plan: Increase sertraline  to 100 mg once daily.  Pt no longer seeing psychiatry, will manage care for now unless further intervention is needed.   Orders: -     Ambulatory referral to Psychology -     Sertraline  HCl; Take two tablets once daily  Dispense: 60 tablet; Refill: 0  Other orders -     Interpretation: -     DM TEMPLATE  Significant time spent in and out of clinic totally 70 minutes including review of historial records, review of prior consults with specialists, review of several imaging studies and lab work, discussing on acute and chronic findings, placing referral etc.   Meds ordered this encounter  Medications   DISCONTD: aspirin  EC 81 MG tablet    Sig: Take 1 tablet (81 mg total) by mouth daily. Swallow whole.    Supervising Provider:   AVELINA, AMY E [[id number]]   pantoprazole  (PROTONIX ) 20 MG tablet    Sig: Take 1 tablet (20 mg total) by mouth daily.    Dispense:  90 tablet    Refill:  0    Supervising Provider:   BEDSOLE, AMY E [[id number]]   sertraline  (ZOLOFT ) 100 MG tablet    Sig: Take two tablets once daily    Dispense:  60 tablet    Refill:  0    Supervising Provider:   BEDSOLE, AMY E [[id number]]   furosemide  (LASIX ) 20 MG tablet    Sig: Take 1 tablet (20 mg total) by mouth daily as needed.    Dispense:  30 tablet  Refill:  3    Supervising Provider:   AVELINA NO E [[id number]]    Follow-up: Return in about 2 weeks  (around 03/17/2023).    Ginger Patrick, FNP

## 2023-03-07 LAB — DRUG MONITORING, PANEL 8 WITH CONFIRMATION, URINE
6 Acetylmorphine: NEGATIVE ng/mL (ref <10)
Alcohol Metabolites: NEGATIVE ng/mL (ref <500)
Alphahydroxyalprazolam: NEGATIVE ng/mL (ref <25)
Alphahydroxymidazolam: NEGATIVE ng/mL (ref <50)
Alphahydroxytriazolam: NEGATIVE ng/mL (ref <50)
Aminoclonazepam: NEGATIVE ng/mL (ref <25)
Amphetamines: NEGATIVE ng/mL (ref <500)
Benzodiazepines: NEGATIVE ng/mL (ref <100)
Buprenorphine, Urine: NEGATIVE ng/mL (ref <5)
Cocaine Metabolite: NEGATIVE ng/mL (ref <150)
Creatinine: >300 mg/dL (ref >=20.0)
Hydroxyethylflurazepam: NEGATIVE ng/mL (ref <50)
Lorazepam: NEGATIVE ng/mL (ref <50)
MDMA: NEGATIVE ng/mL (ref <500)
Marijuana Metabolite: POSITIVE ng/mL — AB (ref <20)
Marijuana Metabolite: 356 ng/mL — ABNORMAL HIGH (ref <5)
Nordiazepam: NEGATIVE ng/mL (ref <50)
Opiates: NEGATIVE ng/mL (ref <100)
Oxazepam: NEGATIVE ng/mL (ref <50)
Oxidant: NEGATIVE ug/mL (ref <200)
Oxycodone: NEGATIVE ng/mL (ref <100)
Temazepam: NEGATIVE ng/mL (ref <50)
pH: 5.3 (ref 4.5–9.0)

## 2023-03-07 LAB — ALPHA-GAL PANEL
Allergen, Mutton, f88: <0.1 kU/L
Allergen, Pork, f26: <0.1 kU/L
Beef: <0.1 kU/L
CLASS: 0
CLASS: 0
Class: 0
GALACTOSE-ALPHA-1,3-GALACTOSE IGE*: <0.1 kU/L (ref <0.10)

## 2023-03-07 LAB — INTERPRETATION:

## 2023-03-07 LAB — DM TEMPLATE

## 2023-03-07 NOTE — Assessment & Plan Note (Signed)
 Try to decrease and or avoid spicy foods, fried fatty foods, and also caffeine and chocolate as these can increase heartburn symptoms.

## 2023-03-07 NOTE — Assessment & Plan Note (Signed)
 Controlled on todays exam. Continue losartan 25 mg once daily

## 2023-03-07 NOTE — Assessment & Plan Note (Signed)
 Repeating today kidney function panel, pending results.

## 2023-03-07 NOTE — Assessment & Plan Note (Addendum)
 Non compliant for some time with follow up Sending in referral for consult with hepatology for ongoing evaluation/treatment.  Will pend workup with cardiology as well as hepatology to determine if blood thinner can be utilized with cirrhosis.  Ordering LFTs today as well to check stability.  Pt currently asymptomatic.   Did d/w risk of use of diclofenac , cox 2 inhibitor. Advisement is againest use of cox 2 in patients with advanced CLD and or cirrhosis. If used could consider possible 50% dose reduction child pugh class b cirrhosis.   Did also discuss NSAIDS should be avoided.  Will send in furosemide  20 mg once daily prn fluid (at current on physical exam no ascites present and history unclear). Notes do state had to be d/c on spironolactone because of hyperkalemia even with concurrent lasix  use.

## 2023-03-07 NOTE — Assessment & Plan Note (Signed)
 Ordered lipid panel, pending results. Work on low cholesterol diet and exercise as tolerated

## 2023-03-07 NOTE — Assessment & Plan Note (Signed)
Continue requip 0.25mg nightly.  

## 2023-03-07 NOTE — Assessment & Plan Note (Signed)
 Not auscultated today on exam.  Pt to continue propanolol 40 mg twice daily Pt has been referred to cardiology pending appt.  Concern is not being on blood thinner, will await workup as pt also with cirrhosis and has been non compliant with follow up. Notes are limited. Has been referred to hepatology as well.  Echocardiogram ordered pending results, DOE.

## 2023-03-07 NOTE — Assessment & Plan Note (Signed)
 Encouraged for cessation.

## 2023-03-07 NOTE — Assessment & Plan Note (Signed)
 Pt advised of the following: Work on a diabetic diet, try to incorporate exercise at least 20-30 a day for 3 days a week or more.

## 2023-03-07 NOTE — Assessment & Plan Note (Signed)
 Discussed red flag symptoms with patient. There is tenderness on palpation, reducible however prominent.  Referral placed to general surgeon for further evaluation.

## 2023-03-09 NOTE — Assessment & Plan Note (Signed)
 Echocardiogram ordered to r/u heart disease

## 2023-03-09 NOTE — Assessment & Plan Note (Signed)
 Increase sertraline to 100 mg once daily.  Pt no longer seeing psychiatry, will manage care for now unless further intervention is needed.

## 2023-03-10 MED ORDER — FUROSEMIDE 20 MG PO TABS: 20.0000 mg | ORAL_TABLET | Freq: Every day | ORAL | 3 refills | Status: DC | PRN

## 2023-03-10 NOTE — Assessment & Plan Note (Signed)
 With insomnia at night, hard for her to sleep.  Could consider belsomra again however limited studies with liver impairment. Ok with kidney impairment. UDS today.

## 2023-03-12 ENCOUNTER — Other Ambulatory Visit: Payer: Self-pay | Admitting: Emergency Medicine

## 2023-03-12 DIAGNOSIS — K219 Gastro-esophageal reflux disease without esophagitis: Secondary | ICD-10-CM

## 2023-03-12 DIAGNOSIS — Z760 Encounter for issue of repeat prescription: Secondary | ICD-10-CM

## 2023-03-18 ENCOUNTER — Other Ambulatory Visit: Payer: Self-pay | Admitting: Emergency Medicine

## 2023-03-18 ENCOUNTER — Encounter: Payer: Self-pay | Admitting: *Deleted

## 2023-03-18 DIAGNOSIS — Z760 Encounter for issue of repeat prescription: Secondary | ICD-10-CM

## 2023-03-18 DIAGNOSIS — I10 Essential (primary) hypertension: Secondary | ICD-10-CM

## 2023-03-18 DIAGNOSIS — I4891 Unspecified atrial fibrillation: Secondary | ICD-10-CM

## 2023-03-22 ENCOUNTER — Encounter: Payer: Self-pay | Admitting: *Deleted

## 2023-03-27 ENCOUNTER — Other Ambulatory Visit: Payer: Self-pay | Admitting: Family

## 2023-03-27 DIAGNOSIS — F322 Major depressive disorder, single episode, severe without psychotic features: Secondary | ICD-10-CM

## 2023-04-05 ENCOUNTER — Encounter: Payer: Self-pay | Admitting: Family

## 2023-04-05 ENCOUNTER — Telehealth: Payer: Self-pay | Admitting: Family

## 2023-04-05 NOTE — Telephone Encounter (Signed)
Let me speak to my superior and see what is the next step for these types of non compliance.

## 2023-04-05 NOTE — Telephone Encounter (Signed)
 Tabitha,   There are several referrals still open on this patient and some closed due to the patient not returning calls to schedule.   I have been unable to reach patient via MyChart or telephone.   (1)  The Diagnostic Mammogram and US  have not been scheduled yet due to the patient not returning [facility]'s call. (See notes on appt desk)  (2)  I have attempted to reach the patient regarding her Hepatology Referral, she has not responded on MyChart no returned calls to discuss referrals.   (3)  The Psychology Referral has been closed by the referred office; they have sent the paperwork to be filled out and the patient has not called to schedule her appt. (See notes within the referral)  (4)  The General Surgery Referral is still pending due to the patient not call back to schedule (See notes within the referral)  (5)  The Echocardiogram is scheduled but I have been unable to get in touch with the patient to let her know when her appointment is. I have sent her a Mychart message as well with the detailed appointment information.       **Patient is scheduled 04/25/23 @ 9a  (Arrive 8:45a [facility])**  (6)  I have faxed the Sleep Study Referral  to [facility] - they will contact her directly to schedule.    I am not sure what else to do further other than keep calling, leaving several messages. She is not calling anyone back (from our office or the offices we referred her to) and she is not reading/responding to MyChart.   Please advise, thanks.

## 2023-04-06 NOTE — Telephone Encounter (Signed)
 Could we consider sending a letter to advise pt of dismissal if pt can not be reached to schedule the six referrals that have been placed? Pt has been called multiple times by referral coordinator as you seen below, and non compliant to schedule.   This way we can determine is a communication issue and or just non compliance.   We have sent mychart messages as well. Thanks!

## 2023-04-06 NOTE — Telephone Encounter (Signed)
 I actually called her again this morning and was unable to leave a message, Automated message stating that the call could not be completed.

## 2023-04-14 NOTE — Telephone Encounter (Signed)
Any update on this and how you would like her referrals and upcoming Echo appointment to be handled?   I have tried calling the patient again, rang several times and then automated message came on stating that the call could not be completed at this time, no voicemail.

## 2023-04-14 NOTE — Telephone Encounter (Signed)
Emily Butler can we please send pt this letter in communications?

## 2023-04-14 NOTE — Telephone Encounter (Signed)
Should I leave the Echo Appt as scheduled or call and cancel it?   She will receive a no show fee if not canceled within 24 hours notice

## 2023-04-14 NOTE — Telephone Encounter (Signed)
Will send again along to Amy Ashtyn. Will let you know  Am going to send a letter stating that if no response x 30 days then she will be dismissed for non compliance.

## 2023-04-14 NOTE — Telephone Encounter (Signed)
Letter has been printed and mailed.

## 2023-04-15 NOTE — Telephone Encounter (Signed)
Appt canceled   Nothing further needed.

## 2023-04-15 NOTE — Telephone Encounter (Signed)
The Echo was scheduled from the order by the department and I was to make the patient aware of the appt date and time. But I have not been able to reach her to make her aware.    I will reach out to them and ask them to cancel it the upcoming appt.   Thanks!

## 2023-04-25 ENCOUNTER — Ambulatory Visit: Payer: Medicare Other

## 2023-05-19 ENCOUNTER — Other Ambulatory Visit: Payer: Self-pay | Admitting: Family

## 2023-05-19 ENCOUNTER — Other Ambulatory Visit: Payer: Self-pay | Admitting: Emergency Medicine

## 2023-05-19 DIAGNOSIS — M2550 Pain in unspecified joint: Secondary | ICD-10-CM

## 2023-05-19 DIAGNOSIS — F322 Major depressive disorder, single episode, severe without psychotic features: Secondary | ICD-10-CM

## 2023-05-19 NOTE — Telephone Encounter (Signed)
 Copied from CRM [id number]. Topic: Clinical - Medication Refill >> May 19, 2023  2:13 PM Denese Killings wrote: Most Recent Primary Care Visit:  Provider: Mort Sawyers  Department: [facility]  Visit Type: TRANSFER OF CARE  Date: 03/03/2023  Medication: sertraline (ZOLOFT) 100 MG tablet   Has the patient contacted their pharmacy? Yes (Agent: If no, request that the patient contact the pharmacy for the refill. If patient does not wish to contact the pharmacy document the reason why and proceed with request.) (Agent: If yes, when and what did the pharmacy advise?) was told it was denied  Is this the correct pharmacy for this prescription? Yes If no, delete pharmacy and type the correct one.  This is the patient's preferred pharmacy:  [id number] [place] - [address] [place] [address] Phone: [phone] Fax: [phone]   Has the prescription been filled recently? Yes  Is the patient out of the medication? No  Has the patient been seen for an appointment in the last year OR does the patient have an upcoming appointment? Yes  Can we respond through MyChart? Yes  Agent: Please be advised that Rx refills may take up to 3 business days. We ask that you follow-up with your pharmacy.

## 2023-05-27 NOTE — Telephone Encounter (Signed)
 She has been discharged. The letter was mailed to her the same day it was written, 04/14/23. I have updated pt's chart to reflect this dismissal.

## 2023-05-27 NOTE — Telephone Encounter (Signed)
 Just for documentation purposes, Emily Mage do you want to me to move forward with closing all of her referrals?

## 2023-05-27 NOTE — Telephone Encounter (Signed)
 Emily Butler,   Has this patient been Dismissed?  Please advise what you would like for me to do with all of her referrals, thanks.

## 2023-05-27 NOTE — Telephone Encounter (Signed)
 Emily Butler can you check to see if she is officially dismissed by our last letter we sent?

## 2023-05-30 NOTE — Telephone Encounter (Signed)
 All referrals and order closed/canceled.  Thanks!

## 2023-05-31 ENCOUNTER — Other Ambulatory Visit: Payer: Self-pay | Admitting: Family

## 2023-05-31 DIAGNOSIS — K219 Gastro-esophageal reflux disease without esophagitis: Secondary | ICD-10-CM

## 2023-06-23 ENCOUNTER — Other Ambulatory Visit: Payer: Self-pay | Admitting: Emergency Medicine

## 2023-06-23 ENCOUNTER — Other Ambulatory Visit: Payer: Self-pay | Admitting: Family

## 2023-06-23 DIAGNOSIS — K219 Gastro-esophageal reflux disease without esophagitis: Secondary | ICD-10-CM

## 2023-06-23 DIAGNOSIS — Z760 Encounter for issue of repeat prescription: Secondary | ICD-10-CM

## 2023-06-23 IMAGING — DX DG ANKLE COMPLETE 3+V*R*
3 series · 3 of 3 positions shown · non-contrast
Comparison: None.

CLINICAL DATA: Pain after injury.

EXAM:
RIGHT ANKLE - COMPLETE 3+ VIEW

[ankle ap]
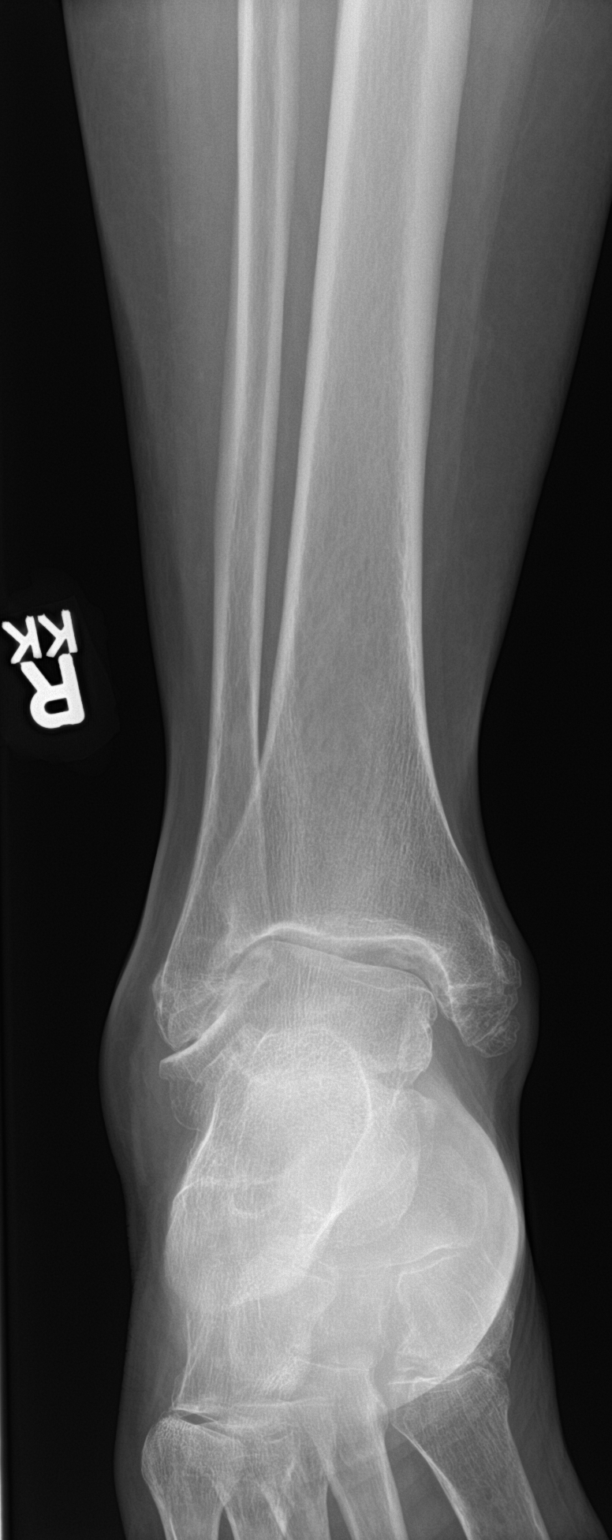

[ankle obl]
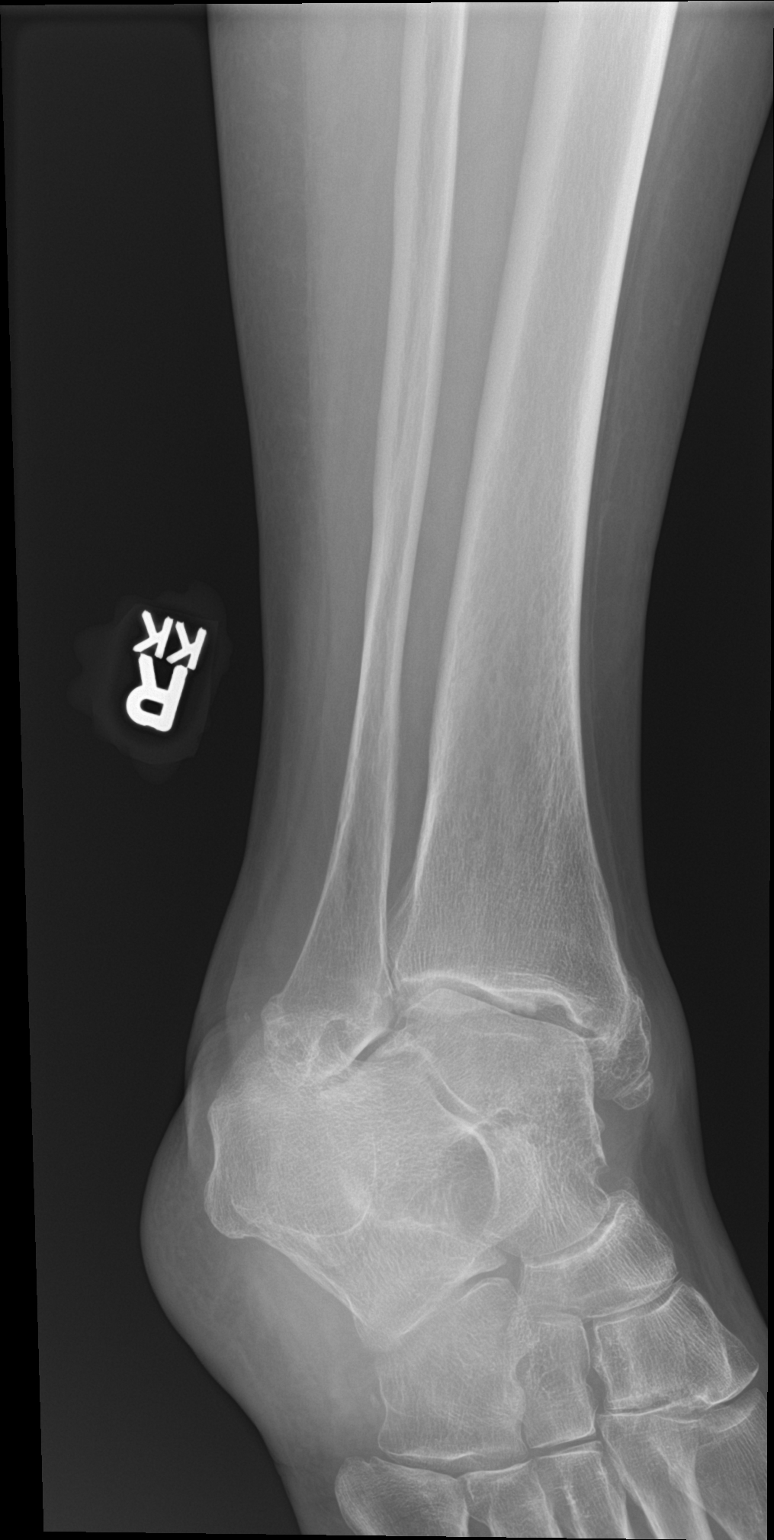

[ankle lat]
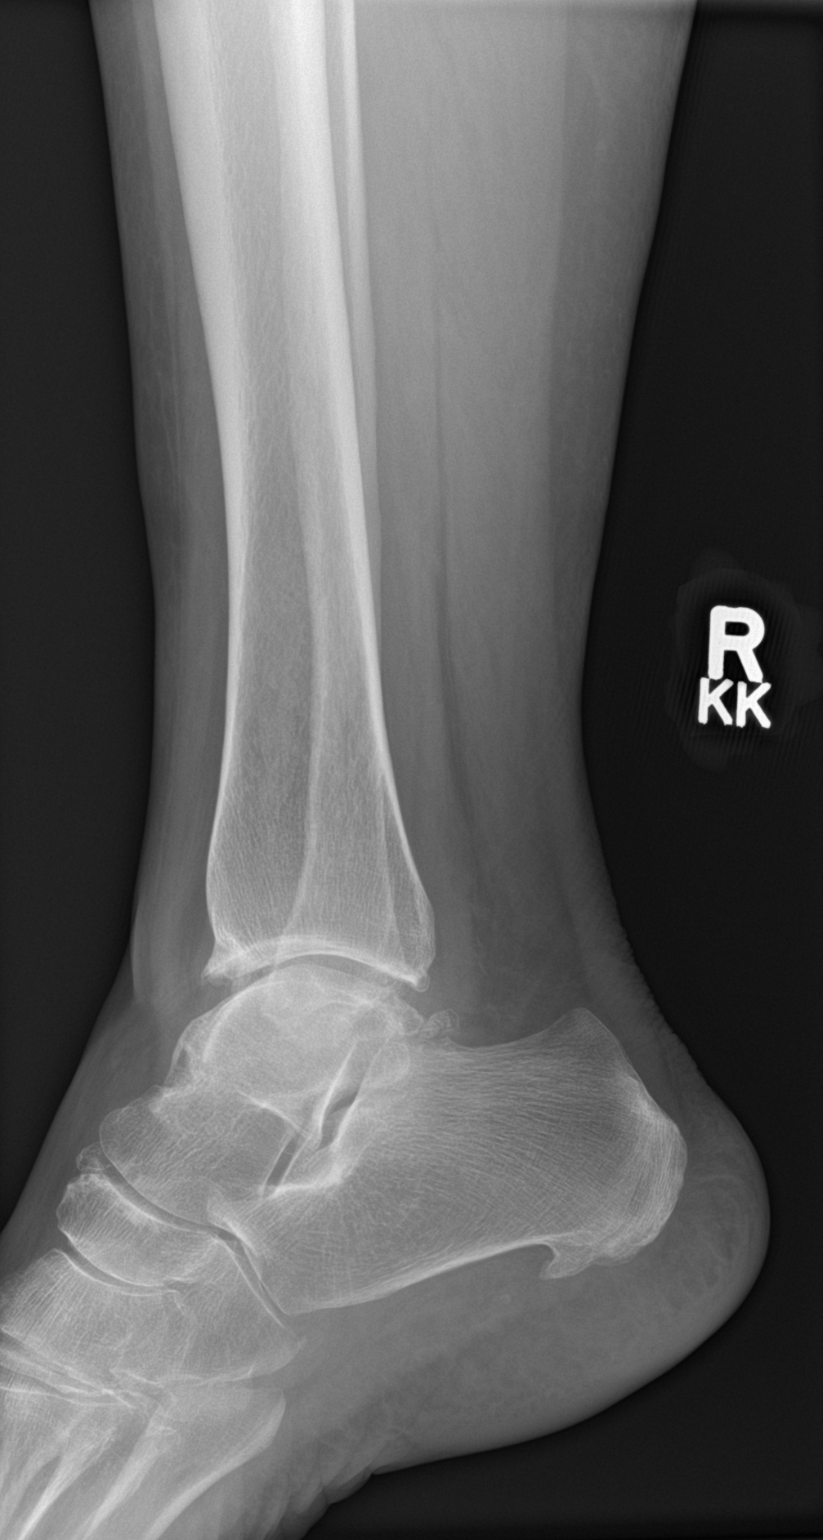

[3 of 3 positions shown; findings below may reference images not displayed]

FINDINGS: Lateral soft tissue swelling. Degenerative changes in the hindfoot.
Calcaneal plantar spur. No acute fractures are identified.
Degenerative changes are seen in ankle. Sequela of previous medial
malleolar avulsion injury. No other acute abnormalities.
IMPRESSION: Lateral soft tissue swelling.  No other acute abnormality.

## 2023-06-24 ENCOUNTER — Other Ambulatory Visit: Payer: Self-pay | Admitting: Family

## 2023-06-24 ENCOUNTER — Telehealth: Payer: Self-pay

## 2023-06-24 DIAGNOSIS — K219 Gastro-esophageal reflux disease without esophagitis: Secondary | ICD-10-CM

## 2023-06-24 NOTE — Telephone Encounter (Signed)
 Copied from CRM [id number]. Topic: Clinical - Prescription Issue >> Jun 24, 2023  1:26 PM Rosamond Comes wrote: Reason for CRM: patient calling in inquiring about medication pantoprazole  (PROTONIX ) 20 MG tablet and why is it not sent to the pharmacy

## 2023-06-29 ENCOUNTER — Other Ambulatory Visit: Payer: Self-pay | Admitting: Emergency Medicine

## 2023-06-29 DIAGNOSIS — Z760 Encounter for issue of repeat prescription: Secondary | ICD-10-CM

## 2023-06-29 DIAGNOSIS — I10 Essential (primary) hypertension: Secondary | ICD-10-CM

## 2023-06-30 ENCOUNTER — Other Ambulatory Visit: Payer: Self-pay | Admitting: Family

## 2023-06-30 DIAGNOSIS — K7030 Alcoholic cirrhosis of liver without ascites: Secondary | ICD-10-CM

## 2023-07-28 ENCOUNTER — Encounter (HOSPITAL_BASED_OUTPATIENT_CLINIC_OR_DEPARTMENT_OTHER): Payer: Self-pay

## 2023-07-29 ENCOUNTER — Encounter (HOSPITAL_BASED_OUTPATIENT_CLINIC_OR_DEPARTMENT_OTHER): Payer: Self-pay

## 2023-08-18 ENCOUNTER — Other Ambulatory Visit: Payer: Self-pay | Admitting: Family

## 2023-08-18 DIAGNOSIS — F322 Major depressive disorder, single episode, severe without psychotic features: Secondary | ICD-10-CM

## 2023-08-22 ENCOUNTER — Telehealth: Payer: Self-pay

## 2023-08-22 NOTE — Telephone Encounter (Signed)
 Copied from CRM [id number]. Topic: Complaint (DO NOT CONVERT) - Dismissed Patient >> Aug 22, 2023 11:13 AM Thersia BROCKS wrote: Patient name/MRN/Acct #: Emily Butler DOS: 03/03/2023 Details of complaint: Patient not sure on why she was dismissed, I did informed her that Office and other offices was trying to reach her and was unable to contact her, she stated she never gotten any message, patient stated she did received the letter regarding the dismissal. Wanted to file an complaint regarding the provider since patient has not be able to receive her medication , would like a callback from the office manager on this issue.   Route to Research officer, political party.

## 2023-09-19 ENCOUNTER — Encounter (HOSPITAL_BASED_OUTPATIENT_CLINIC_OR_DEPARTMENT_OTHER): Admitting: Family Medicine

## 2023-09-21 ENCOUNTER — Ambulatory Visit: Payer: Self-pay

## 2023-09-21 ENCOUNTER — Ambulatory Visit (HOSPITAL_COMMUNITY)
Admission: EM | Admit: 2023-09-21 | Discharge: 2023-09-21 | Disposition: A | Discharge status: Home / Self Care | Attending: Nurse Practitioner | Admitting: Nurse Practitioner

## 2023-09-21 DIAGNOSIS — F3289 Other specified depressive episodes: Secondary | ICD-10-CM

## 2023-09-21 DIAGNOSIS — F4321 Adjustment disorder with depressed mood: Secondary | ICD-10-CM | POA: Diagnosis not present

## 2023-09-21 DIAGNOSIS — Z7689 Persons encountering health services in other specified circumstances: Secondary | ICD-10-CM

## 2023-09-21 DIAGNOSIS — F32A Depression, unspecified: Secondary | ICD-10-CM | POA: Diagnosis present

## 2023-09-21 NOTE — ED Provider Notes (Signed)
 Behavioral Health Urgent Care Medical Screening Exam  Patient Name: Emily Butler MRN: [id number] Date of Evaluation: 09/22/23 Chief Complaint:   I'm feeling depressed, I lost my brother a month ago.  Diagnosis:  Final diagnoses:  Other depression  Feeling grief  Encounter for psychiatric assessment    History of Present illness: Emily Butler is a 68 y.o. female.  With psychiatric history of depression, alcoholic, and medical history of hep C, cirrhosis of the liver, A-fib, and hypertension, who presented voluntarily to [facility] via [facility] due to depression and on/off dizziness when she wakes up in the morning.  Patient was seen face-to-face by this provider and chart reviewed.  Patient reports She reports losing her brother a month ago which has made her to become increasingly depressed and she finds herself crying to his memory.  She reports not been linked to a therapist or grief counselor at this time.  Patient reports she has been experiencing dizzy spells with difficulty getting out of bed in the morning for about 4 months. She reports her dizzy spells are off/on and not constant. She reports discussing it with her PCP at the time and was given medical referrals due to her liver cirrhosis and other health issues, which she did not follow up with due to transportation difficulties and other stressful situations at the time.  Patient reports she was dropped in March by her primary care provider who manages all her medications because she didn't follow through with her appointments and referrals.    She reports being prescribed -Zoloft , losartan , propranolol , and omeprazole .     She reports regularly taking her propranolol  without checking her vital signs daily because she thought it was the right thing to do.   She reports also experiencing back pain which she will address  at her new PCP appointment in August.  Patient reports she having enough of propranolol  to last her until her  new appointment for August 20 with the new provider.  She is unsure if she wants to restart Zoloft , stating therapy will be best for her at this time.  She reports using CBD daily to help her sleep.  She lives alone and denies access to a gun.  She reports feeling safe returning home. She reports having 3 emotional support dogs that she takes care of.  Patient reports she is seeking outpatient psychiatric support for medication management and therapy. She denies current dizziness and feels safe returning home to her dogs.  On evaluation, patient is alert, oriented x 3, and cooperative. Speech is clear, and coherent. Pt appears casually dressed. Eye contact is good. Mood is depressed, affect is congruent with mood. Thought process is coherent and thought content is WDL. Pt denies SI/HI/AVH. There is no objective indication that the patient is responding to internal stimuli. No delusions elicited during this assessment.    Discussed recommendation for discharge and follow-up with [facility] outpatient walk-in clinic to establish care for medication management and therapy.  Patient is provided with opportunity for questions.  She verbalized understanding and is in agreement.  Flowsheet Row ED from 09/21/2023 in [facility] Video Visit from 05/27/2022 in confidential department Video Visit from 04/16/2022 in confidential department  C-SSRS RISK CATEGORY Moderate Risk Low Risk Low Risk    Psychiatric Specialty Exam  Presentation  General Appearance:Casual  Eye Contact:Good  Speech:Clear and Coherent  Speech Volume:Normal  Handedness:Right   Mood and Affect  Mood: Depressed  Affect: Congruent   Thought Process  Thought Processes: Coherent  Descriptions of Associations:Intact  Orientation:Full (Time, Place and Person)  Thought Content:WDL    Hallucinations:None  Ideas of Reference:None  Suicidal Thoughts:No  Homicidal Thoughts:No   Sensorium   Memory: Immediate Good  Judgment: Intact  Insight: Good   Executive Functions  Concentration: Good  Attention Span: Good  Recall: Good  Fund of Knowledge: Good  Language: Good   Psychomotor Activity  Psychomotor Activity: Normal   Assets  Assets: Communication Skills; Desire for Improvement   Sleep  Sleep: Good  Number of hours: No data recorded  Physical Exam: Physical Exam Constitutional:      Appearance: She is not toxic-appearing or diaphoretic.  HENT:     Nose: No congestion.  Cardiovascular:     Rate and Rhythm: Normal rate.  Pulmonary:     Effort: No respiratory distress.  Chest:     Chest wall: No tenderness.  Neurological:     Mental Status: She is alert and oriented to person, place, and time.  Psychiatric:        Attention and Perception: Attention and perception normal.        Mood and Affect: Mood is depressed. Affect is tearful.        Speech: Speech normal.        Behavior: Behavior is cooperative.        Thought Content: Thought content normal.    Review of Systems  Constitutional:  Negative for chills, diaphoresis and fever.  HENT:  Negative for congestion.   Eyes:  Negative for discharge.  Respiratory:  Negative for cough, shortness of breath and wheezing.   Cardiovascular:  Negative for chest pain and palpitations.  Gastrointestinal:  Negative for diarrhea, nausea and vomiting.  Neurological:  Negative for dizziness, seizures, weakness and headaches.  Psychiatric/Behavioral:  Positive for depression and substance abuse.    Blood pressure 123/69, pulse 77, temperature 98.4 F (36.9 C), temperature source Oral, resp. rate 18, SpO2 96%. There is no height or weight on file to calculate BMI.  Musculoskeletal: Strength & Muscle Tone: within normal limits Gait & Station: normal Patient leans: N/A   [facility] MSE Discharge Disposition for Follow up and Recommendations: Based on my evaluation the patient does not appear to  have an emergency medical condition and can be discharged with resources and follow up care in outpatient services for Medication Management and Individual Therapy  Recommend discharge home and follow up with [facility] outpatient walk-in clinic for medication management and individual therapy.  Patient denies SI/HI/AVH or paranoia.  Patient does not meet inpatient psychiatric admission criteria or IVC criteria at this time.  There is no evidence of imminent risk of harm to self or others.  Discharge recommendations:  Patient is to take medications as prescribed. Please follow up with your primary care provider for all medical related needs.   Therapy: We recommend that patient participate in individual therapy to address mental health concerns.  Safety:  The patient should abstain from use of illicit substances/drugs and abuse of any medications. If symptoms worsen or do not continue to improve or if the patient becomes actively suicidal or homicidal then it is recommended that the patient return to the closest hospital emergency department, the [facility], or call 911 for further evaluation and treatment. National Suicide Prevention Lifeline 1-800-SUICIDE or 929-215-2078.  About 988 988 offers 24/7 access to trained crisis counselors who can help people experiencing mental health-related distress. People can call or text 988 or chat 988lifeline.org  for themselves or if they are worried about a loved one who may need crisis support.  Crisis Mobile: [facility]:                     214-839-2285 (for crisis response 24 hours a day) [facility] Hotline:                                            315-093-4348   Patient is discharged home in stable condition.  Thurman LULLA Ivans, NP 09/22/2023, 3:54 AM

## 2023-09-21 NOTE — ED Notes (Addendum)
 Notified [facility] of need to transport back to her home as patient arrived via [employer] and has not met admission criteria for IP. Dispatch notified that they would provide transport as soon as they are able. Explained to patient to await in lobby pending arrival of Transport. Patient verbalized understanding of plan, provided resources, and AVS explained/provided to patient.

## 2023-09-21 NOTE — Discharge Instructions (Signed)
  Discharge recommendations:  Please follow up with your primary care provider for all medical related needs.   Therapy: We recommend that patient participate in individual therapy to address mental health concerns.  Medications: The patient or guardian is to contact a medical professional and/or outpatient provider to address any new side effects that develop. The patient or guardian should update outpatient providers of any new medications and/or medication changes.   Safety:  The patient should abstain from use of illicit substances/drugs and abuse of any medications. If symptoms worsen or do not continue to improve or if the patient becomes actively suicidal or homicidal then it is recommended that the patient return to the closest hospital emergency department, the [facility], or call 911 for further evaluation and treatment. National Suicide Prevention Lifeline 1-800-SUICIDE or 484-091-1991.  About 988 988 offers 24/7 access to trained crisis counselors who can help people experiencing mental health-related distress. People can call or text 988 or chat 988lifeline.org for themselves or if they are worried about a loved one who may need crisis support.  Crisis Mobile: [facility]:                     618-154-1176 (for crisis response 24 hours a day) [facility] Hotline:                                            239 878 6954
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
## 2023-09-21 NOTE — Progress Notes (Signed)
   09/21/23 1930  [facility] Triage Screening (Walk-ins at [facility] only)  How Did You Hear About Us ? Other (Comment)  What Is the Reason for Your Visit/Call Today? Emily Butler is a 67 year old female presenting as a voluntary walk-in to [facility] due to depression and severe dizziness. Patient denied SI, HI, psychosis and alcohol/drug usage. Patient states I have severe dizziness, walking into walls having to hold myself up, I am all over the place when walking, it takes me 2 hours just to get out of bed, but as the day passes it gets better, because I have to walk my dogs. Patient reports dizziness started 4-5 months ago and that it gets so bad that she has sit down and hold her head really tight. Patient also reports grief/loss issues over brother death 1 month ago stating I just wish it was me instead of him. Patient does not have a psychiatrist or therapist. Patient reports she was getting depression medicine from PCP and that she has been off medication for 5 months.  How Long Has This Been Causing You Problems? 1-6 months  Have You Recently Had Any Thoughts About Hurting Yourself? No  Are You Planning to Commit Suicide/Harm Yourself At This time? No  Have you Recently Had Thoughts About Hurting Someone Sherral? No  Are You Planning To Harm Someone At This Time? No  Physical Abuse Denies  Verbal Abuse Denies  Sexual Abuse Denies  Exploitation of patient/patient's resources Denies  Self-Neglect Denies  Possible abuse reported to:  (n/a)  Are you currently experiencing any auditory, visual or other hallucinations? No  Have You Used Any Alcohol or Drugs in the Past 24 Hours? No  Do you have any current medical co-morbidities that require immediate attention? No  Clinician description of patient physical appearance/behavior: neat / cooperative  What Do You Feel Would Help You the Most Today? Treatment for Depression or other mood problem;Medication(s)  If access to [facility] was not available,  would you have sought care in the Emergency Department? No  Determination of Need Routine (7 days)  Options For Referral Medication Management;Outpatient Therapy    Flowsheet Row ED from 09/21/2023 in [facility] Video Visit from 05/27/2022 in [facility] Video Visit from 04/16/2022 in [facility]  C-SSRS RISK CATEGORY Moderate Risk Low Risk Low Risk

## 2023-10-16 ENCOUNTER — Other Ambulatory Visit: Payer: Self-pay | Admitting: Emergency Medicine

## 2023-10-16 DIAGNOSIS — I10 Essential (primary) hypertension: Secondary | ICD-10-CM

## 2023-10-16 DIAGNOSIS — Z760 Encounter for issue of repeat prescription: Secondary | ICD-10-CM

## 2023-10-16 DIAGNOSIS — I4891 Unspecified atrial fibrillation: Secondary | ICD-10-CM

## 2023-10-19 ENCOUNTER — Encounter (HOSPITAL_BASED_OUTPATIENT_CLINIC_OR_DEPARTMENT_OTHER): Payer: Self-pay | Admitting: Family Medicine

## 2023-10-19 ENCOUNTER — Ambulatory Visit (INDEPENDENT_AMBULATORY_CARE_PROVIDER_SITE_OTHER): Admitting: Family Medicine

## 2023-10-19 VITALS — BP 122/82 | HR 66 | Ht 63.0 in | Wt 174.9 lb

## 2023-10-19 DIAGNOSIS — R739 Hyperglycemia, unspecified: Secondary | ICD-10-CM

## 2023-10-19 DIAGNOSIS — I4891 Unspecified atrial fibrillation: Secondary | ICD-10-CM | POA: Diagnosis not present

## 2023-10-19 DIAGNOSIS — E782 Mixed hyperlipidemia: Secondary | ICD-10-CM

## 2023-10-19 DIAGNOSIS — F322 Major depressive disorder, single episode, severe without psychotic features: Secondary | ICD-10-CM

## 2023-10-19 DIAGNOSIS — M25579 Pain in unspecified ankle and joints of unspecified foot: Secondary | ICD-10-CM | POA: Insufficient documentation

## 2023-10-19 DIAGNOSIS — I10 Essential (primary) hypertension: Secondary | ICD-10-CM | POA: Diagnosis not present

## 2023-10-19 DIAGNOSIS — K7460 Unspecified cirrhosis of liver: Secondary | ICD-10-CM

## 2023-10-19 DIAGNOSIS — R053 Chronic cough: Secondary | ICD-10-CM

## 2023-10-19 DIAGNOSIS — Z760 Encounter for issue of repeat prescription: Secondary | ICD-10-CM | POA: Diagnosis not present

## 2023-10-19 DIAGNOSIS — G8929 Other chronic pain: Secondary | ICD-10-CM

## 2023-10-19 DIAGNOSIS — Z9181 History of falling: Secondary | ICD-10-CM | POA: Insufficient documentation

## 2023-10-19 DIAGNOSIS — K219 Gastro-esophageal reflux disease without esophagitis: Secondary | ICD-10-CM

## 2023-10-19 MED ORDER — BENZONATATE 200 MG PO CAPS: 200.0000 mg | ORAL_CAPSULE | Freq: Three times a day (TID) | ORAL | 0 refills | Status: DC | PRN

## 2023-10-19 MED ORDER — OMEPRAZOLE 20 MG PO CPDR: 20.0000 mg | DELAYED_RELEASE_CAPSULE | Freq: Every day | ORAL | 1 refills | Status: AC

## 2023-10-19 MED ORDER — PROPRANOLOL HCL 40 MG PO TABS: 40.0000 mg | ORAL_TABLET | Freq: Two times a day (BID) | ORAL | 1 refills | Status: AC

## 2023-10-19 MED ORDER — SERTRALINE HCL 50 MG PO TABS: 50.0000 mg | ORAL_TABLET | Freq: Every day | ORAL | 1 refills | Status: AC

## 2023-10-19 NOTE — Patient Instructions (Signed)
  Medication Instructions:  Your physician recommends that you continue on your current medications as directed. Please refer to the Current Medication list given to you today. --If you need a refill on any your medications before your next appointment, please call your pharmacy first. If no refills are authorized on file call the office.-- Lab Work: Your physician has recommended that you have lab work today: today If you have labs (blood work) drawn today and your tests are completely normal, you will receive your results via MyChart message OR a phone call from our staff.  Please ensure you check your voicemail in the event that you authorized detailed messages to be left on a delegated number. If you have any lab test that is abnormal or we need to change your treatment, we will call you to review the results.  Referrals/Procedures/Imaging: Referrals placed   Follow-Up: Your next appointment:   Your physician recommends that you schedule a follow-up appointment in: 6-8 week follow up  with Dr. de Peru  You will receive a text message or e-mail with a link to a survey about your care and experience with us  today! We would greatly appreciate your feedback!   Thanks for letting us  be apart of your health journey!!  Primary Care and Sports Medicine   Dr. Court Distance Peru   We encourage you to activate your patient portal called "MyChart".  Sign up information is provided on this After Visit Summary.  MyChart is used to connect with patients for Virtual Visits (Telemedicine).  Patients are able to view lab/test results, encounter notes, upcoming appointments, etc.  Non-urgent messages can be sent to your provider as well. To learn more about what you can do with MyChart, please visit --  [email or web address].

## 2023-10-19 NOTE — Assessment & Plan Note (Signed)
 She does feel that this is partially contributed to by history of ankle pain and I feel that having further evaluation with orthopedics would be warranted.  Additionally, patient likely to benefit from physical therapy, can discuss further with orthopedist

## 2023-10-19 NOTE — Assessment & Plan Note (Signed)
 Patient requesting referral to orthopedist, feel this is reasonable, referral placed today

## 2023-10-19 NOTE — Progress Notes (Signed)
 New Patient Office Visit  Subjective   Patient ID: Emily Butler, female    DOB: 1955-10-16  Age: 68 y.o. MRN: [id number]  CC:  Chief Complaint  Patient presents with   New Patient (Initial Visit)    Former PCP [facility] was only saw once and was told to find a new pcp no reason given has had cough for 2 months that will not go away also dizziness for a few months has been out of medications for few months besides propranolol      HPI GEANETTE BUONOCORE presents to establish care Last PCP - [place] [facility]  Cirrhosis: was following with [facility] liver specialist previously, last PCP did refer patient to GI, however patient did not schedule appointment. Patient with history of Hep C, alcohol abuse. Needing to coordinate establishing with new liver specialist.  CKD: stage 3 noted on history.  Most recent labs completed previously have shown GFR to be borderline between stage 2 versus 3.  Depression: was taking sertraline  previously, medication stopped as prior PCP was not approving refills as she was discharged from practice. Previously was doing well with medication.  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  Atrial fibrillation: Noted on history, patient previously was referred to cardiology, however did not have evaluation completed.  Currently denies any issues with palpitations or feeling heart racing.  She reports chronic issue with ankle.  Requesting referral to orthopedist today for further evaluation and treatment.  History of chronic cough, history of tobacco use.  Has tried OTC measures to try and help out with cough without significant relief.  Outpatient Encounter Medications as of 10/19/2023  Medication Sig   benzonatate  (TESSALON ) 200 MG capsule Take 1 capsule (200 mg total) by mouth 3 (three) times daily as needed for cough.   [DISCONTINUED] furosemide   (LASIX ) 20 MG tablet Take 1 tablet (20 mg total) by mouth daily as needed.   [DISCONTINUED] pantoprazole  (PROTONIX ) 20 MG tablet Take 1 tablet (20 mg total) by mouth daily.   [DISCONTINUED] propranolol  (INDERAL ) 40 MG tablet TAKE 1 TABLET BY MOUTH TWICE A DAY   losartan  (COZAAR ) 25 MG tablet TAKE 1 TABLET (25 MG TOTAL) BY MOUTH DAILY. (Patient not taking: Reported on 10/19/2023)   omeprazole  (PRILOSEC) 20 MG capsule Take 1 capsule (20 mg total) by mouth daily.   propranolol  (INDERAL ) 40 MG tablet Take 1 tablet (40 mg total) by mouth 2 (two) times daily.   rOPINIRole  (REQUIP ) 0.25 MG tablet TAKE 1 TABLET BY MOUTH AT BEDTIME. (Patient not taking: Reported on 10/19/2023)   sertraline  (ZOLOFT ) 50 MG tablet Take 1 tablet (50 mg total) by mouth daily.   [DISCONTINUED] Multiple Vitamin (MULTI-VITAMIN) tablet Take by mouth.   [DISCONTINUED] omeprazole  (PRILOSEC) 20 MG capsule TAKE 1 CAPSULE BY MOUTH EVERY DAY (Patient not taking: Reported on 10/19/2023)   [DISCONTINUED] sertraline  (ZOLOFT ) 100 MG tablet Take 1 tablet (100 mg total) by mouth daily. (Patient not taking: Reported on 10/19/2023)   No facility-administered encounter medications on file as of 10/19/2023.    Past Medical History:  Diagnosis Date   Alcoholic (HCC)    Cirrhosis of liver (HCC)    Depression    EXCESSIVE MENSTRUATION 11/08/2006   Qualifier: Diagnosis of  By: Baird FNP, Frieda Kipper    GERD (gastroesophageal reflux disease)    Hepatitis C 2011   Hypertension     Past Surgical History:  Procedure Laterality Date   CESAREAN SECTION      Family History  Problem Relation Age of Onset   Alcohol abuse Mother    Drug abuse Mother    Heart disease Father     Social History   Socioeconomic History   Marital status: Divorced    Spouse name: Not on file   Number of children: 4   Years of education: Not on file   Highest education level: Not on file  Occupational History   Occupation: disability  Tobacco Use    Smoking status: Former    Passive exposure: Past   Smokeless tobacco: Never  Vaping Use   Vaping status: Never Used  Substance and Sexual Activity   Alcohol use: Not Currently    Comment: Alcoholic quit 10 years ago   Drug use: Not Currently    Types: Cocaine   Sexual activity: Not Currently  Other Topics Concern   Not on file  Social History Narrative   Not on file   Social Drivers of Health   Financial Resource Strain: Low Risk  (11/17/2022)   Overall Financial Resource Strain (CARDIA)    Difficulty of Paying Living Expenses: Not hard at all  Food Insecurity: No Food Insecurity (11/17/2022)   Hunger Vital Sign    Worried About Running Out of Food in the Last Year: Never true    Ran Out of Food in the Last Year: Never true  Transportation Needs: No Transportation Needs (11/17/2022)   PRAPARE - Administrator, Civil Service (Medical): No    Lack of Transportation (Non-Medical): No  Physical Activity: Insufficiently Active (11/17/2022)   Exercise Vital Sign    Days of Exercise per Week: 3 days    Minutes of Exercise per Session: 40 min  Stress: No Stress Concern Present (11/17/2022)   [employer] of Occupational Health - Occupational Stress Questionnaire    Feeling of Stress : Not at all  Social Connections: Unknown (11/17/2022)   Social Connection and Isolation Panel    Frequency of Communication with Friends and Family: Once a week    Frequency of Social Gatherings with Friends and Family: Never    Attends Religious Services: Never    Database administrator or Organizations: No    Attends Banker Meetings: Never    Marital Status: Not on file  Intimate Partner Violence: Not At Risk (11/17/2022)   Humiliation, Afraid, Rape, and Kick questionnaire    Fear of Current or Ex-Partner: No    Emotionally Abused: No    Physically Abused: No    Sexually Abused: No    Objective   BP 122/82 (BP Location: Right Arm, Patient Position: Sitting, Cuff  Size: Normal)   Pulse 66   Ht 5' 3 (1.6 m)   Wt 174 lb 14.4 oz (79.3 kg)   SpO2 96%   BMI 30.98 kg/m   Physical Exam  68 year old female in no acute distress Cardiovascular exam with regular rate and rhythm Lungs clear to auscultation bilaterally  Assessment & Plan:   Essential hypertension Assessment & Plan: Blood pressure appropriate in office today.  Can continue with propranolol , we will hold off on losartan  for the time being given appropriate blood pressure at this time.  Consider resuming medications check her blood pressure be elevated in the future  Orders: -     Propranolol  HCl; Take 1 tablet (40 mg total) by mouth 2 (two) times daily.  Dispense: 180 tablet; Refill: 1 -  CBC with Differential/Platelet -     Comprehensive metabolic panel with GFR -     Hemoglobin A1c  Medication refill -     Propranolol  HCl; Take 1 tablet (40 mg total) by mouth 2 (two) times daily.  Dispense: 180 tablet; Refill: 1 -     Omeprazole ; Take 1 capsule (20 mg total) by mouth daily.  Dispense: 90 capsule; Refill: 1  Atrial fibrillation, unspecified type [facility]) Assessment & Plan: Noted on history.  Patient with sinus rhythm on exam today.  I do feel that patient would still benefit from evaluation with cardiology given history.  May benefit from Holter monitor in order to assess potential atrial fibrillation burden, referral placed  Orders: -     Propranolol  HCl; Take 1 tablet (40 mg total) by mouth 2 (two) times daily.  Dispense: 180 tablet; Refill: 1 -     Ambulatory referral to Cardiology  Current severe episode of major depressive disorder without psychotic features, unspecified whether recurrent (HCC) Assessment & Plan: Can resume sertraline  as she was doing well with this previously.  Will start with lower dose however.  Recommend starting with 50 mg dose daily.  Can increase dose after a few weeks if tolerating and if symptoms not appropriately controlled.  Will plan for close  follow-up to monitor progress Consider referral to psychiatry in future  Orders: -     Sertraline  HCl; Take 1 tablet (50 mg total) by mouth daily.  Dispense: 90 tablet; Refill: 1  Gastroesophageal reflux disease without esophagitis Assessment & Plan: Recommend establishing with gastroenterology given underlying GERD as well as history of cirrhosis.  Ultimately, patient may need to also establish with hepatologist  Orders: -     Ambulatory referral to Gastroenterology -     Omeprazole ; Take 1 capsule (20 mg total) by mouth daily.  Dispense: 90 capsule; Refill: 1  At high risk for injury related to fall Assessment & Plan: She does feel that this is partially contributed to by history of ankle pain and I feel that having further evaluation with orthopedics would be warranted.  Additionally, patient likely to benefit from physical therapy, can discuss further with orthopedist   Chronic ankle pain, unspecified laterality Assessment & Plan: Patient requesting referral to orthopedist, feel this is reasonable, referral placed today  Orders: -     Ambulatory referral to Orthopedic Surgery  Hepatic cirrhosis, unspecified hepatic cirrhosis type, unspecified whether ascites present [facility]) Assessment & Plan: As above  Orders: -     Ambulatory referral to Gastroenterology -     Comprehensive metabolic panel with GFR -     Hemoglobin A1c  Chronic cough -     Pulmonary Visit  Mixed hyperlipidemia -     Lipid panel  Hyperglycemia, unspecified -     Hemoglobin A1c  Other orders -     Benzonatate ; Take 1 capsule (200 mg total) by mouth 3 (three) times daily as needed for cough.  Dispense: 45 capsule; Refill: 0  Return in about 6 weeks (around 11/30/2023) for 40 minutes.   Spent 67 minutes on this patient encounter, including preparation, chart review, face-to-face counseling with patient and coordination of care, and documentation of  encounter   ___________________________________________ Simon Aaberg de [place], MD, ABFM, [facility] Sports Medicine [facility]

## 2023-10-19 NOTE — Assessment & Plan Note (Signed)
 Can resume sertraline  as she was doing well with this previously.  Will start with lower dose however.  Recommend starting with 50 mg dose daily.  Can increase dose after a few weeks if tolerating and if symptoms not appropriately controlled.  Will plan for close follow-up to monitor progress Consider referral to psychiatry in future

## 2023-10-19 NOTE — Assessment & Plan Note (Signed)
-   As above

## 2023-10-19 NOTE — Assessment & Plan Note (Signed)
 Blood pressure appropriate in office today.  Can continue with propranolol , we will hold off on losartan  for the time being given appropriate blood pressure at this time.  Consider resuming medications check her blood pressure be elevated in the future

## 2023-10-19 NOTE — Assessment & Plan Note (Signed)
 Recommend establishing with gastroenterology given underlying GERD as well as history of cirrhosis.  Ultimately, patient may need to also establish with hepatologist

## 2023-10-19 NOTE — Assessment & Plan Note (Signed)
 Noted on history.  Patient with sinus rhythm on exam today.  I do feel that patient would still benefit from evaluation with cardiology given history.  May benefit from Holter monitor in order to assess potential atrial fibrillation burden, referral placed

## 2023-10-20 ENCOUNTER — Telehealth (HOSPITAL_BASED_OUTPATIENT_CLINIC_OR_DEPARTMENT_OTHER): Payer: Self-pay | Admitting: *Deleted

## 2023-10-20 LAB — CBC WITH DIFFERENTIAL/PLATELET
Basophils Absolute: 0.1 x10E3/uL (ref 0.0–0.2)
Basos: 1 %
EOS (ABSOLUTE): 0.2 x10E3/uL (ref 0.0–0.4)
Eos: 2 %
Hematocrit: 43.1 % (ref 34.0–46.6)
Hemoglobin: 14.5 g/dL (ref 11.1–15.9)
Immature Grans (Abs): 0 x10E3/uL (ref 0.0–0.1)
Immature Granulocytes: 0 %
Lymphocytes Absolute: 1.2 x10E3/uL (ref 0.7–3.1)
Lymphs: 17 %
MCH: 32.6 pg (ref 26.6–33.0)
MCHC: 33.6 g/dL (ref 31.5–35.7)
MCV: 97 fL (ref 79–97)
Monocytes Absolute: 0.5 x10E3/uL (ref 0.1–0.9)
Monocytes: 8 %
Neutrophils Absolute: 4.8 x10E3/uL (ref 1.4–7.0)
Neutrophils: 72 %
Platelets: 113 x10E3/uL — ABNORMAL LOW (ref 150–450)
RBC: 4.45 x10E6/uL (ref 3.77–5.28)
RDW: 12.9 % (ref 11.7–15.4)
WBC: 6.7 x10E3/uL (ref 3.4–10.8)

## 2023-10-20 LAB — COMPREHENSIVE METABOLIC PANEL WITH GFR
ALT: 11 IU/L (ref 0–32)
AST: 25 IU/L (ref 0–40)
Albumin: 4.6 g/dL (ref 3.9–4.9)
Alkaline Phosphatase: 106 IU/L (ref 44–121)
BUN/Creatinine Ratio: 37 — ABNORMAL HIGH (ref 12–28)
BUN: 30 mg/dL — ABNORMAL HIGH (ref 8–27)
Bilirubin Total: 0.9 mg/dL (ref 0.0–1.2)
CO2: 21 mmol/L (ref 20–29)
Calcium: 9.4 mg/dL (ref 8.7–10.3)
Chloride: 108 mmol/L — ABNORMAL HIGH (ref 96–106)
Creatinine, Ser: 0.81 mg/dL (ref 0.57–1.00)
Globulin, Total: 1.8 g/dL (ref 1.5–4.5)
Glucose: 127 mg/dL — ABNORMAL HIGH (ref 70–99)
Potassium: 4 mmol/L (ref 3.5–5.2)
Sodium: 142 mmol/L (ref 134–144)
Total Protein: 6.4 g/dL (ref 6.0–8.5)
eGFR: 80 mL/min/1.73 (ref >59)

## 2023-10-20 LAB — LIPID PANEL
Chol/HDL Ratio: 3.5 ratio (ref 0.0–4.4)
Cholesterol, Total: 187 mg/dL (ref 100–199)
HDL: 54 mg/dL (ref >39)
LDL Chol Calc (NIH): 114 mg/dL — ABNORMAL HIGH (ref 0–99)
Triglycerides: 108 mg/dL (ref 0–149)
VLDL Cholesterol Cal: 19 mg/dL (ref 5–40)

## 2023-10-20 LAB — HEMOGLOBIN A1C
Est. average glucose Bld gHb Est-mCnc: 108 mg/dL
Hgb A1c MFr Bld: 5.4 % (ref 4.8–5.6)

## 2023-10-20 NOTE — Telephone Encounter (Signed)
 Please let patient know provider has not reviewed lab results. Once they are reviewed she will receive a message with these results

## 2023-10-20 NOTE — Telephone Encounter (Signed)
 Copied from CRM #[id number]. Topic: Clinical - Lab/Test Results >> Oct 20, 2023 12:36 PM Amy B wrote: Reason for CRM: patient requests a call back to the discuss the results of her lab work.

## 2023-10-26 ENCOUNTER — Telehealth (HOSPITAL_BASED_OUTPATIENT_CLINIC_OR_DEPARTMENT_OTHER): Payer: Self-pay | Admitting: *Deleted

## 2023-10-26 ENCOUNTER — Ambulatory Visit (HOSPITAL_BASED_OUTPATIENT_CLINIC_OR_DEPARTMENT_OTHER): Payer: Self-pay | Admitting: Family Medicine

## 2023-10-26 NOTE — Telephone Encounter (Signed)
 noted

## 2023-10-26 NOTE — Telephone Encounter (Signed)
 Please advise on lab results.

## 2023-10-26 NOTE — Telephone Encounter (Signed)
 Copied from CRM [id number]. Topic: Clinical - Lab/Test Results >> Oct 26, 2023  2:52 PM Rosaria BRAVO wrote: Reason for CRM: Pt called for lab results, please advise. Waiting for feedback from PCP.

## 2023-11-02 ENCOUNTER — Ambulatory Visit (HOSPITAL_BASED_OUTPATIENT_CLINIC_OR_DEPARTMENT_OTHER): Admitting: Orthopaedic Surgery

## 2023-11-23 ENCOUNTER — Ambulatory Visit (HOSPITAL_BASED_OUTPATIENT_CLINIC_OR_DEPARTMENT_OTHER): Admitting: Orthopaedic Surgery

## 2023-11-23 ENCOUNTER — Ambulatory Visit (HOSPITAL_BASED_OUTPATIENT_CLINIC_OR_DEPARTMENT_OTHER)

## 2023-11-23 ENCOUNTER — Other Ambulatory Visit (HOSPITAL_BASED_OUTPATIENT_CLINIC_OR_DEPARTMENT_OTHER): Payer: Self-pay

## 2023-11-23 DIAGNOSIS — M25571 Pain in right ankle and joints of right foot: Secondary | ICD-10-CM

## 2023-11-23 DIAGNOSIS — M79671 Pain in right foot: Secondary | ICD-10-CM | POA: Diagnosis not present

## 2023-11-23 MED ORDER — TRIAMCINOLONE ACETONIDE 40 MG/ML IJ SUSP
80.0000 mg | INTRAMUSCULAR | Status: AC | PRN
  Administered 2023-11-23: 80 mg via INTRA_ARTICULAR

## 2023-11-23 MED ORDER — LIDOCAINE HCL 1 % IJ SOLN
4.0000 mL | INTRAMUSCULAR | Status: AC | PRN
  Administered 2023-11-23: 4 mL

## 2023-11-23 NOTE — Progress Notes (Signed)
 Chief Complaint: Pain     History of Present Illness:    Emily Butler is a 68 y.o. female presents today for ongoing right ankle pain for the last several years.  She does have a history of instability and giving out in the ankle.  She was diagnosed with ankle instability in the 30s although did not have surgery at that time.  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  She states that her ankle pain has been causing falls and this does catch.  She has had an injection in the past which did give her significant relief    PMH/PSH/Family History/Social History/Meds/Allergies:    Past Medical History:  Diagnosis Date  . Alcoholic (HCC)   . Cirrhosis of liver (HCC)   . Depression   . EXCESSIVE MENSTRUATION 11/08/2006   Qualifier: Diagnosis of  By: Baird FNP, Frieda Kipper   . GERD (gastroesophageal reflux disease)   . Hepatitis C 2011  . Hypertension    Past Surgical History:  Procedure Laterality Date  . CESAREAN SECTION     Social History   Socioeconomic History  . Marital status: Divorced    Spouse name: Not on file  . Number of children: 4  . Years of education: Not on file  . Highest education level: Not on file  Occupational History  . Occupation: disability  Tobacco Use  . Smoking status: Former    Passive exposure: Past  . Smokeless tobacco: Never  Vaping Use  . Vaping status: Never Used  Substance and Sexual Activity  . Alcohol use: Not Currently    Comment: Alcoholic quit 10 years ago  . Drug use: Not Currently    Types: Cocaine  . Sexual activity: Not Currently  Other Topics Concern  . Not on file  Social History Narrative  . Not on file   Social Drivers of Health   Financial Resource Strain: Low Risk  (11/17/2022)   Overall Financial Resource Strain (CARDIA)   . Difficulty of Paying Living Expenses: Not hard at all  Food Insecurity: No Food Insecurity (11/17/2022)   Hunger Vital Sign   . Worried About Programme researcher, broadcasting/film/video  in the Last Year: Never true   . Ran Out of Food in the Last Year: Never true  Transportation Needs: No Transportation Needs (11/17/2022)   PRAPARE - Transportation   . Lack of Transportation (Medical): No   . Lack of Transportation (Non-Medical): No  Physical Activity: Insufficiently Active (11/17/2022)   Exercise Vital Sign   . Days of Exercise per Week: 3 days   . Minutes of Exercise per Session: 40 min  Stress: No Stress Concern Present (11/17/2022)   [employer] of Occupational Health - Occupational Stress Questionnaire   . Feeling of Stress : Not at all  Social Connections: Unknown (11/17/2022)   Social Connection and Isolation Panel   . Frequency of Communication with Friends and Family: Once a week   . Frequency of Social Gatherings with Friends and Family: Never   . Attends Religious Services: Never   . Active Member of Clubs or Organizations: No   . Attends Banker Meetings: Never   . Marital Status: Not on file   Family History  Problem Relation Age of Onset  . Alcohol abuse Mother   . Drug abuse Mother   . Heart disease Father    Allergies  Allergen Reactions  . Duloxetine Other (See Comments) and Nausea Only   Current  Outpatient Medications  Medication Sig Dispense Refill  . benzonatate  (TESSALON ) 200 MG capsule Take 1 capsule (200 mg total) by mouth 3 (three) times daily as needed for cough. 45 capsule 0  . losartan  (COZAAR ) 25 MG tablet TAKE 1 TABLET (25 MG TOTAL) BY MOUTH DAILY. (Patient not taking: Reported on 10/19/2023) 90 tablet 1  . omeprazole  (PRILOSEC) 20 MG capsule Take 1 capsule (20 mg total) by mouth daily. 90 capsule 1  . propranolol  (INDERAL ) 40 MG tablet Take 1 tablet (40 mg total) by mouth 2 (two) times daily. 180 tablet 1  . rOPINIRole  (REQUIP ) 0.25 MG tablet TAKE 1 TABLET BY MOUTH AT BEDTIME. (Patient not taking: Reported on 10/19/2023) 90 tablet 0  . sertraline  (ZOLOFT ) 50 MG tablet Take 1 tablet (50 mg total) by mouth daily. 90  tablet 1   No current facility-administered medications for this visit.   No results found.  Review of Systems:   A ROS was performed including pertinent positives and negatives as documented in the HPI.  Physical Exam :   Constitutional: NAD and appears stated age Neurological: Alert and oriented Psych: Appropriate affect and cooperative There were no vitals taken for this visit.   Comprehensive Musculoskeletal Exam:    Right ankle with tenderness about the medial and lateral joint line.  Pain with passive dorsiflexion to 20 degrees and plantarflexion 20 degrees.  Foot is in valgus at baseline.  Remainder of distal neurosensory exams intact   Imaging:   Xray (3 views right ankle): Advanced ankle osteoarthritis     I personally reviewed and interpreted the radiographs.   Assessment and Plan:   68 y.o. 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  At this time given her persistent ankle pain and the fact that she has now had multiple injections including 1 today I do ultimately believe she may be a surgical candidate.  Will plan for referral for Dr. Harden for discussion of possible fusion    Procedure Note  Patient: Emily Butler             Date of Birth: Mar 20, 1955           MRN: [id number]             Visit Date: 11/23/2023  Procedures: Visit Diagnoses:  1. Pain in right ankle and joints of right foot     Medium Joint Inj: R ankle on 11/23/2023 2:15 PM Indications: pain Details: 22 G 1.5 in needle, ultrasound-guided anterior approach Medications: 4 mL lidocaine  1 %; 80 mg triamcinolone  acetonide 40 MG/ML Outcome: tolerated well, no immediate complications Immediately prior to procedure a time out was called to verify the correct patient, procedure, equipment, support staff and site/side marked as required. Patient was prepped and draped in the usual sterile fashion.         I personally saw and evaluated the patient,  and participated in the management and treatment plan.  Elspeth Parker, MD Attending Physician, Orthopedic Surgery  This document was dictated using Dragon voice recognition software. A reasonable attempt at proof reading has been made to minimize errors.

## 2023-11-30 ENCOUNTER — Encounter (HOSPITAL_BASED_OUTPATIENT_CLINIC_OR_DEPARTMENT_OTHER): Payer: Self-pay | Admitting: Family Medicine

## 2023-11-30 ENCOUNTER — Ambulatory Visit (INDEPENDENT_AMBULATORY_CARE_PROVIDER_SITE_OTHER): Admitting: Family Medicine

## 2023-11-30 VITALS — BP 133/75 | HR 61 | Ht 63.0 in | Wt 172.5 lb

## 2023-11-30 DIAGNOSIS — I4891 Unspecified atrial fibrillation: Secondary | ICD-10-CM

## 2023-11-30 DIAGNOSIS — J309 Allergic rhinitis, unspecified: Secondary | ICD-10-CM | POA: Insufficient documentation

## 2023-11-30 DIAGNOSIS — Z23 Encounter for immunization: Secondary | ICD-10-CM

## 2023-11-30 DIAGNOSIS — F322 Major depressive disorder, single episode, severe without psychotic features: Secondary | ICD-10-CM | POA: Diagnosis not present

## 2023-11-30 DIAGNOSIS — I10 Essential (primary) hypertension: Secondary | ICD-10-CM

## 2023-11-30 MED ORDER — FEXOFENADINE HCL 180 MG PO TABS: 180.0000 mg | ORAL_TABLET | Freq: Every day | ORAL | 1 refills | Status: AC

## 2023-11-30 NOTE — Assessment & Plan Note (Signed)
 Blood pressure appropriate in office today.  Can continue with propranolol , and can continue with holding losartan  given appropriate blood pressure at this time.  Consider resuming losartan  if blood pressure is elevated in the future

## 2023-11-30 NOTE — Progress Notes (Signed)
    Procedures performed today:    None.  Independent interpretation of notes and tests performed by another provider:   None.  Brief History, Exam, Impression, and Recommendations:    BP 133/75 (BP Location: Left Arm, Patient Position: Sitting, Cuff Size: Normal)   Pulse 61   Ht 5' 3 (1.6 m)   Wt 172 lb 8 oz (78.2 kg)   SpO2 97%   BMI 30.56 kg/m   Essential hypertension Assessment & Plan: Blood pressure appropriate in office today.  Can continue with propranolol , and can continue with holding losartan  given appropriate blood pressure at this time.  Consider resuming losartan  if blood pressure is elevated in the future   Encounter for immunization -     Flu vaccine HIGH DOSE PF(Fluzone Trivalent)  Atrial fibrillation, unspecified type [facility]) Assessment & Plan: Noted on history.  Patient referred previously, has not scheduled yet, I do feel that patient would still benefit from evaluation with cardiology given history.  Patient provided with contact information for cardiology office so that she may schedule appointment.   Current severe episode of major depressive disorder without psychotic features, unspecified whether recurrent (HCC) Assessment & Plan: Doing well with resuming sertraline . Has noted significant improvement in symptoms with starting back on medication. Feels that symptoms are well-controlled with current dose of sertraline . We discussed options related to medication and given progress so far, we can continue with current dose, no changes today.   Allergic rhinitis, unspecified seasonality, unspecified trigger Assessment & Plan: Patient reports having some increased sinus related symptoms that she feels is related to allergies.  Thus far, she has tried Benadryl and Zyrtec without significant relief. We did discuss alternative options.  Discussed utilizing alternative second-generation antihistamines including Allegra or Claritin.  Prescription sent to pharmacy  for Allegra for patient to compare prices and utilize whichever is more affordable.  Also discussed role of intranasal steroid spray as well as nasal saline spray.  Discussed options in regards to these nasal sprays.   Other orders -     Fexofenadine HCl; Take 1 tablet (180 mg total) by mouth daily.  Dispense: 90 tablet; Refill: 1  Return in about 4 months (around 04/01/2024) for med check.   ___________________________________________ Robbi Spells de [place], MD, ABFM, [facility] Primary Care and Sports Medicine [facility]

## 2023-11-30 NOTE — Assessment & Plan Note (Addendum)
 Noted on history.  Patient referred previously, has not scheduled yet, I do feel that patient would still benefit from evaluation with cardiology given history.  Patient provided with contact information for cardiology office so that she may schedule appointment.

## 2023-11-30 NOTE — Assessment & Plan Note (Signed)
 Patient reports having some increased sinus related symptoms that she feels is related to allergies.  Thus far, she has tried Benadryl and Zyrtec without significant relief. We did discuss alternative options.  Discussed utilizing alternative second-generation antihistamines including Allegra or Claritin.  Prescription sent to pharmacy for Allegra for patient to compare prices and utilize whichever is more affordable.  Also discussed role of intranasal steroid spray as well as nasal saline spray.  Discussed options in regards to these nasal sprays.

## 2023-11-30 NOTE — Assessment & Plan Note (Signed)
 Doing well with resuming sertraline . Has noted significant improvement in symptoms with starting back on medication. Feels that symptoms are well-controlled with current dose of sertraline . We discussed options related to medication and given progress so far, we can continue with current dose, no changes today.

## 2023-11-30 NOTE — Patient Instructions (Signed)

## 2023-12-01 ENCOUNTER — Telehealth (HOSPITAL_BASED_OUTPATIENT_CLINIC_OR_DEPARTMENT_OTHER): Payer: Self-pay | Admitting: *Deleted

## 2023-12-01 NOTE — Telephone Encounter (Signed)
 LVM to schedule medicare wellness visit

## 2023-12-03 NOTE — Progress Notes (Deleted)
 New Patient Pulmonology Office Visit   Subjective:  Patient ID: Emily Butler, female    DOB: 07/30/1955  MRN: [id number]  Referred by: de [place], Raymond J, MD  CC: No chief complaint on file.   HPI Emily Butler is a 68 y.o. female with *** who presents for initial evaluation of chronic cough.  CTD Symptoms:   PMH:  - Hx of TB ***  - Hx of endemic fungal infections ***  - Hx of Sarcoidosis ***  - Hx of CTD ***  - Hx of lymphoma or other hematological malignancies ***  - Hx of irradiation to the chest or immunotherapy ***   PSH:   Medications:   Allergies:   Social Hx: - Smoking ***  - Second hand smoking ***  - Vaping ***  - Alcohol ***  - Illicit substances ***  - Indoor emission from household combustion ***  - Hobbies (e.g. wood work, Surveyor, quantity, bird breeding etc...)  - Landscape architect (e.g. mold) ***  - Pets ***  - Birds ***   {Occupational Exposures:33652}  Family Hx:  - Lung disease ***  - CTD ***  - Sarcoidosis ***  - Cancer ***    {PULM QUESTIONNAIRES (Optional):33196}  ROS  Allergies: Duloxetine  Current Outpatient Medications:    benzonatate  (TESSALON ) 200 MG capsule, Take 1 capsule (200 mg total) by mouth 3 (three) times daily as needed for cough., Disp: 45 capsule, Rfl: 0   fexofenadine (ALLEGRA) 180 MG tablet, Take 1 tablet (180 mg total) by mouth daily., Disp: 90 tablet, Rfl: 1   losartan  (COZAAR ) 25 MG tablet, TAKE 1 TABLET (25 MG TOTAL) BY MOUTH DAILY. (Patient not taking: Reported on 11/30/2023), Disp: 90 tablet, Rfl: 1   omeprazole  (PRILOSEC) 20 MG capsule, Take 1 capsule (20 mg total) by mouth daily., Disp: 90 capsule, Rfl: 1   propranolol  (INDERAL ) 40 MG tablet, Take 1 tablet (40 mg total) by mouth 2 (two) times daily., Disp: 180 tablet, Rfl: 1   rOPINIRole  (REQUIP ) 0.25 MG tablet, TAKE 1 TABLET BY MOUTH AT BEDTIME. (Patient not taking: Reported on 11/30/2023), Disp: 90 tablet, Rfl: 0   sertraline  (ZOLOFT ) 50 MG tablet, Take  1 tablet (50 mg total) by mouth daily., Disp: 90 tablet, Rfl: 1 Past Medical History:  Diagnosis Date   Alcoholic (HCC)    Cirrhosis of liver (HCC)    Depression    EXCESSIVE MENSTRUATION 11/08/2006   Qualifier: Diagnosis of  By: Baird FNP, Frieda Kipper    GERD (gastroesophageal reflux disease)    Hepatitis C 2011   Hypertension    Past Surgical History:  Procedure Laterality Date   CESAREAN SECTION     Family History  Problem Relation Age of Onset   Alcohol abuse Mother    Drug abuse Mother    Heart disease Father    Social History   Socioeconomic History   Marital status: Divorced    Spouse name: Not on file   Number of children: 4   Years of education: Not on file   Highest education level: Not on file  Occupational History   Occupation: disability  Tobacco Use   Smoking status: Former    Passive exposure: Past   Smokeless tobacco: Never  Vaping Use   Vaping status: Never Used  Substance and Sexual Activity   Alcohol use: Not Currently    Comment: Alcoholic quit 10 years ago   Drug use: Not Currently    Types: Cocaine   Sexual activity:  Not Currently  Other Topics Concern   Not on file  Social History Narrative   Not on file   Social Drivers of Health   Financial Resource Strain: Low Risk  (11/17/2022)   Overall Financial Resource Strain (CARDIA)    Difficulty of Paying Living Expenses: Not hard at all  Food Insecurity: No Food Insecurity (11/17/2022)   Hunger Vital Sign    Worried About Running Out of Food in the Last Year: Never true    Ran Out of Food in the Last Year: Never true  Transportation Needs: No Transportation Needs (11/17/2022)   PRAPARE - Administrator, Civil Service (Medical): No    Lack of Transportation (Non-Medical): No  Physical Activity: Insufficiently Active (11/17/2022)   Exercise Vital Sign    Days of Exercise per Week: 3 days    Minutes of Exercise per Session: 40 min  Stress: No Stress Concern Present  (11/17/2022)   [employer] of Occupational Health - Occupational Stress Questionnaire    Feeling of Stress : Not at all  Social Connections: Unknown (11/17/2022)   Social Connection and Isolation Panel    Frequency of Communication with Friends and Family: Once a week    Frequency of Social Gatherings with Friends and Family: Never    Attends Religious Services: Never    Database administrator or Organizations: No    Attends Banker Meetings: Never    Marital Status: Not on file  Intimate Partner Violence: Not At Risk (11/17/2022)   Humiliation, Afraid, Rape, and Kick questionnaire    Fear of Current or Ex-Partner: No    Emotionally Abused: No    Physically Abused: No    Sexually Abused: No       Objective:  There were no vitals taken for this visit. {Pulm Vitals (Optional):32837}  Physical Exam  Diagnostic Review:  {Labs (Optional):32838}     Assessment & Plan:   Assessment & Plan   No orders of the defined types were placed in this encounter.     No follow-ups on file.   Sly Parlee, MD

## 2023-12-05 ENCOUNTER — Ambulatory Visit (HOSPITAL_BASED_OUTPATIENT_CLINIC_OR_DEPARTMENT_OTHER): Admitting: Pulmonary Disease

## 2023-12-05 ENCOUNTER — Telehealth (HOSPITAL_BASED_OUTPATIENT_CLINIC_OR_DEPARTMENT_OTHER): Payer: Self-pay | Admitting: *Deleted

## 2023-12-05 NOTE — Telephone Encounter (Signed)
 Copied from CRM #[id number]. Topic: Clinical - Medical Advice >> Dec 05, 2023 12:01 PM Tiffany B wrote: Reason for CRM: Patient wanted to make PCP aware the voltaren  cream is helping for the pain in right ankle and other joints and will keep upcoming orthopedic appointment.

## 2023-12-05 NOTE — Telephone Encounter (Signed)
 FYI

## 2024-01-02 ENCOUNTER — Encounter: Payer: Self-pay | Admitting: Radiology

## 2024-01-02 NOTE — Progress Notes (Signed)
 "  New Patient Pulmonology Office Visit   Subjective:  Patient ID: Emily Butler, female    DOB: February 26, 1956  MRN: [id number]  Referred by: de [place], Raymond J, MD  CC:  Chief Complaint  Patient presents with   Consult    Chronic cough    HPI [name] is a 68 y.o. female with HTN, GERD, nicotine dependence in remission, and alcoholic cirrhosis of liver who presents for initial evaluation of chronic cough.  Discussed the use of AI scribe software for clinical note transcription with the patient, who gave verbal consent to proceed.  History of Present Illness Emily Butler is a 68 year old female with hepatitis C who presents with a chronic cough and chest pain. She was referred by Dr. De Cuba for evaluation of her chronic cough and chest pain.  She has experienced a persistent cough lasting almost two months before her visit to Dr. De Cuba. The cough was not associated with smoking or any known triggers, as she had stopped smoking many years ago. The cough resolved after resuming her medications, including omeprazole , which she had run out of during the period of her symptoms. No wheezing. The cough occurs more at night, particularly when lying flat, but does not occur after eating.  She describes experiencing pain around her lungs, likening it to a cramp, with some pain located in her back and sides. This pain has been present since she was diagnosed with hepatitis C in 2011, which led to a hospitalization for sepsis. She has not identified any specific triggers for the pain.  Her past medical history includes hepatitis C, diagnosed in 2011, and depression. She was hospitalized for five days due to sepsis related to hepatitis C. She was off her antidepressants for a period due to a change in her healthcare provider, which coincided with the onset of her cough.  She has a history of smoking, having started at age 10 and quit around 2006 or 2007. She smoked on and off for about 20  years, with periods of cessation during pregnancy. No exposure to asbestos or mold in her work history.  She experiences severe allergies, with symptoms including runny nose and watery eyes, but no specific triggers have been identified. She uses an over-the-counter allergy medication and a prescription from Dr. De [place], which she finds somewhat helpful.  Her brother passed away from stage four lung cancer, which was discovered after he was hospitalized and found to have a tumor in his frontal lobe. This family history of cancer has caused her concern about her own health.  ROS  Allergies: Duloxetine  Current Outpatient Medications:    fexofenadine  (ALLEGRA ) 180 MG tablet, Take 1 tablet (180 mg total) by mouth daily., Disp: 90 tablet, Rfl: 1   omeprazole  (PRILOSEC) 20 MG capsule, Take 1 capsule (20 mg total) by mouth daily., Disp: 90 capsule, Rfl: 1   propranolol  (INDERAL ) 40 MG tablet, Take 1 tablet (40 mg total) by mouth 2 (two) times daily., Disp: 180 tablet, Rfl: 1   sertraline  (ZOLOFT ) 50 MG tablet, Take 1 tablet (50 mg total) by mouth daily., Disp: 90 tablet, Rfl: 1   benzonatate  (TESSALON ) 200 MG capsule, Take 1 capsule (200 mg total) by mouth 3 (three) times daily as needed for cough. (Patient not taking: Reported on 01/03/2024), Disp: 45 capsule, Rfl: 0   losartan  (COZAAR ) 25 MG tablet, TAKE 1 TABLET (25 MG TOTAL) BY MOUTH DAILY. (Patient not taking: Reported on 01/03/2024), Disp:  90 tablet, Rfl: 1   rOPINIRole  (REQUIP ) 0.25 MG tablet, TAKE 1 TABLET BY MOUTH AT BEDTIME. (Patient not taking: Reported on 01/03/2024), Disp: 90 tablet, Rfl: 0 Past Medical History:  Diagnosis Date   Alcoholic (HCC)    Cirrhosis of liver (HCC)    Depression    EXCESSIVE MENSTRUATION 11/08/2006   Qualifier: Diagnosis of  By: Baird FNP, Frieda Kipper    GERD (gastroesophageal reflux disease)    Hepatitis C 2011   Hypertension    Past Surgical History:  Procedure Laterality Date   CESAREAN SECTION      Family History  Problem Relation Age of Onset   Alcohol abuse Mother    Drug abuse Mother    Heart disease Father    Social History   Socioeconomic History   Marital status: Divorced    Spouse name: Not on file   Number of children: 4   Years of education: Not on file   Highest education level: Not on file  Occupational History   Occupation: disability  Tobacco Use   Smoking status: Former    Current packs/day: 0.00    Average packs/day: 1 pack/day for 33.0 years (33.0 ttl pk-yrs)    Types: Cigarettes    Start date: 6    Quit date: 2005    Years since quitting: 20.8    Passive exposure: Past   Smokeless tobacco: Never  Vaping Use   Vaping status: Never Used  Substance and Sexual Activity   Alcohol use: Not Currently    Comment: Alcoholic quit 10 years ago   Drug use: Not Currently    Types: Cocaine   Sexual activity: Not Currently  Other Topics Concern   Not on file  Social History Narrative   Not on file   Social Drivers of Health   Financial Resource Strain: Low Risk  (11/17/2022)   Overall Financial Resource Strain (CARDIA)    Difficulty of Paying Living Expenses: Not hard at all  Food Insecurity: No Food Insecurity (11/17/2022)   Hunger Vital Sign    Worried About Running Out of Food in the Last Year: Never true    Ran Out of Food in the Last Year: Never true  Transportation Needs: No Transportation Needs (11/17/2022)   PRAPARE - Administrator, Civil Service (Medical): No    Lack of Transportation (Non-Medical): No  Physical Activity: Insufficiently Active (11/17/2022)   Exercise Vital Sign    Days of Exercise per Week: 3 days    Minutes of Exercise per Session: 40 min  Stress: No Stress Concern Present (11/17/2022)   [employer] of Occupational Health - Occupational Stress Questionnaire    Feeling of Stress : Not at all  Social Connections: Unknown (11/17/2022)   Social Connection and Isolation Panel    Frequency of Communication  with Friends and Family: Once a week    Frequency of Social Gatherings with Friends and Family: Never    Attends Religious Services: Never    Database Administrator or Organizations: No    Attends Banker Meetings: Never    Marital Status: Not on file  Intimate Partner Violence: Not At Risk (11/17/2022)   Humiliation, Afraid, Rape, and Kick questionnaire    Fear of Current or Ex-Partner: No    Emotionally Abused: No    Physically Abused: No    Sexually Abused: No       Objective:  BP 128/81   Pulse 68   Ht 5' 3 (1.6  m)   Wt 177 lb 4.8 oz (80.4 kg)   SpO2 96%   BMI 31.41 kg/m  Wt Readings from Last 3 Encounters:  01/03/24 177 lb 4.8 oz (80.4 kg)  11/30/23 172 lb 8 oz (78.2 kg)  10/19/23 174 lb 14.4 oz (79.3 kg)   BMI Readings from Last 3 Encounters:  01/03/24 31.41 kg/m  11/30/23 30.56 kg/m  10/19/23 30.98 kg/m   SpO2 Readings from Last 3 Encounters:  01/03/24 96%  11/30/23 97%  10/19/23 96%   Physical Exam General: NAD, alert, WD, WN Eyes: PERRL, no scleral icterus ENMT: oropharynx clear, good dentition, no oral lesions, mallampati score IV Skin: warm, intact, no rashes Neck: JVD flat, ROM and lymph node assessment normal CV: RRR, no MRG, nl S1 and S2, no peripheral edema Resp: crackles in right lower lung field, no clubbing/cyanosis Neuro: Awake alert oriented to person place time and situation  Diagnostic Review:  Last CBC Lab Results  Component Value Date   WBC 6.7 10/19/2023   HGB 14.5 10/19/2023   HCT 43.1 10/19/2023   MCV 97 10/19/2023   MCH 32.6 10/19/2023   RDW 12.9 10/19/2023   PLT 113 (L) 10/19/2023   Last metabolic panel Lab Results  Component Value Date   GLUCOSE 127 (H) 10/19/2023   NA 142 10/19/2023   K 4.0 10/19/2023   CL 108 (H) 10/19/2023   CO2 21 10/19/2023   BUN 30 (H) 10/19/2023   CREATININE 0.81 10/19/2023   EGFR 80 10/19/2023   CALCIUM 9.4 10/19/2023   PROT 6.4 10/19/2023   ALBUMIN 4.6 10/19/2023   LABGLOB  1.8 10/19/2023   BILITOT 0.9 10/19/2023   ALKPHOS 106 10/19/2023   AST 25 10/19/2023   ALT 11 10/19/2023   ANIONGAP 11 06/28/2018   CT ABD/PLV 2020: FINDINGS: Lower chest: Paraesophageal varices (series 2, image 9). No cardiomegaly, pericardial effusion or pleural effusion. Minor lung base scarring or atelectasis similar to 2013.    Assessment & Plan:   Assessment & Plan Gastroesophageal reflux disease without esophagitis Discussed GERD lifestyle modifications which include: - Avoid lying down for at least two hours after a meal or after drinking acidic beverages, like soda, or other caffeinated beverages. - Keep your head elevated while you sleep. Using an extra pillow or two can also help to prevent reflux. - Eat smaller and more frequent meals each day instead of a few large meals. - Quit smoking. - Reduce excess weight around the midsection Chronic cough Chronic cough likely due to gastroesophageal reflux disease Chronic cough likely related to GERD, improved with omeprazole , suggesting reflux as primary cause. Crackles in right lower lung require further investigation. - Order chest x-ray to rule out parenchyma lung abnormalities. - PFTs to rule out obstructive lung disease Allergic rhinitis, unspecified seasonality, unspecified trigger Allergic rhinitis Allergic rhinitis with nasal congestion, rhinorrhea, and watery eyes, exacerbated by environmental factors. Current medication provides some relief. Continue antihistamines.  Orders Placed This Encounter  Procedures   DG Chest 2 View   Pulmonary function test   I spent 30 minutes reviewing patient's chart including prior consultant notes, imaging, and PFTs as well as face-to-face with the patient, over half in discussion of the diagnosis and the importance of compliance with the treatment plan.  Return in about 4 weeks (around 01/31/2024).   Aquarius Latouche, MD "

## 2024-01-03 ENCOUNTER — Ambulatory Visit (INDEPENDENT_AMBULATORY_CARE_PROVIDER_SITE_OTHER)

## 2024-01-03 ENCOUNTER — Ambulatory Visit (INDEPENDENT_AMBULATORY_CARE_PROVIDER_SITE_OTHER): Admitting: Pulmonary Disease

## 2024-01-03 ENCOUNTER — Encounter (HOSPITAL_BASED_OUTPATIENT_CLINIC_OR_DEPARTMENT_OTHER): Payer: Self-pay | Admitting: Pulmonary Disease

## 2024-01-03 VITALS — BP 128/81 | HR 68 | Ht 63.0 in | Wt 177.3 lb

## 2024-01-03 DIAGNOSIS — J309 Allergic rhinitis, unspecified: Secondary | ICD-10-CM | POA: Diagnosis not present

## 2024-01-03 DIAGNOSIS — R053 Chronic cough: Secondary | ICD-10-CM

## 2024-01-03 DIAGNOSIS — Z87891 Personal history of nicotine dependence: Secondary | ICD-10-CM | POA: Diagnosis not present

## 2024-01-03 DIAGNOSIS — K219 Gastro-esophageal reflux disease without esophagitis: Secondary | ICD-10-CM | POA: Diagnosis not present

## 2024-01-03 NOTE — Patient Instructions (Signed)
  VISIT SUMMARY: You visited us  today due to a chronic cough and chest pain. We discussed your symptoms, including your history of hepatitis C and allergies, and planned further tests to investigate your condition.  YOUR PLAN: CHRONIC COUGH: Your chronic cough is likely due to gastroesophageal reflux disease (GERD), which has improved with omeprazole . -We will order a chest x-ray to rule out pneumonia or lung collapse. -You will have a breathing test to assess your lung function. -We will review the results of these tests at your follow-up appointment.  ALLERGIC RHINITIS: You have allergic rhinitis, which causes nasal congestion, runny nose, and watery eyes. -Continue using your current allergy medications as they provide some relief.  Contains text generated by [employer].

## 2024-01-03 NOTE — Assessment & Plan Note (Signed)
 Discussed GERD lifestyle modifications which include: - Avoid lying down for at least two hours after a meal or after drinking acidic beverages, like soda, or other caffeinated beverages. - Keep your head elevated while you sleep. Using an extra pillow or two can also help to prevent reflux. - Eat smaller and more frequent meals each day instead of a few large meals. - Quit smoking. - Reduce excess weight around the midsection

## 2024-01-03 NOTE — Assessment & Plan Note (Signed)
 Allergic rhinitis Allergic rhinitis with nasal congestion, rhinorrhea, and watery eyes, exacerbated by environmental factors. Current medication provides some relief. Continue antihistamines.

## 2024-01-09 ENCOUNTER — Ambulatory Visit: Payer: Self-pay | Admitting: Pulmonary Disease

## 2024-01-11 ENCOUNTER — Telehealth: Payer: Self-pay | Admitting: Orthopaedic Surgery

## 2024-01-11 NOTE — Telephone Encounter (Signed)
 Patient called and wants to know if you could give her something for pain. Volteran the pill form she ask. CB#[phone]

## 2024-01-12 ENCOUNTER — Other Ambulatory Visit (HOSPITAL_BASED_OUTPATIENT_CLINIC_OR_DEPARTMENT_OTHER): Payer: Self-pay | Admitting: Orthopaedic Surgery

## 2024-01-12 MED ORDER — DICLOFENAC SODIUM 50 MG PO TBEC: 50.0000 mg | DELAYED_RELEASE_TABLET | Freq: Two times a day (BID) | ORAL | 2 refills | Status: AC

## 2024-01-24 ENCOUNTER — Ambulatory Visit: Admitting: Cardiology

## 2024-01-24 ENCOUNTER — Ambulatory Visit (HOSPITAL_BASED_OUTPATIENT_CLINIC_OR_DEPARTMENT_OTHER)

## 2024-02-08 ENCOUNTER — Ambulatory Visit (HOSPITAL_BASED_OUTPATIENT_CLINIC_OR_DEPARTMENT_OTHER): Admitting: Cardiology

## 2024-02-17 ENCOUNTER — Encounter (HOSPITAL_BASED_OUTPATIENT_CLINIC_OR_DEPARTMENT_OTHER)

## 2024-02-17 ENCOUNTER — Ambulatory Visit (HOSPITAL_BASED_OUTPATIENT_CLINIC_OR_DEPARTMENT_OTHER): Admitting: Pulmonary Disease

## 2024-02-27 ENCOUNTER — Ambulatory Visit: Admitting: Orthopedic Surgery

## 2024-03-19 ENCOUNTER — Encounter: Payer: Self-pay | Admitting: Orthopedic Surgery

## 2024-03-19 ENCOUNTER — Ambulatory Visit: Admitting: Orthopedic Surgery

## 2024-03-19 DIAGNOSIS — M19171 Post-traumatic osteoarthritis, right ankle and foot: Secondary | ICD-10-CM | POA: Diagnosis not present

## 2024-03-19 MED ORDER — METHYLPREDNISOLONE ACETATE 40 MG/ML IJ SUSP
40.0000 mg | INTRAMUSCULAR | Status: AC | PRN
  Administered 2024-03-19: 40 mg via INTRA_ARTICULAR

## 2024-03-19 MED ORDER — LIDOCAINE HCL 1 % IJ SOLN
2.0000 mL | INTRAMUSCULAR | Status: AC | PRN
  Administered 2024-03-19: 2 mL

## 2024-03-19 NOTE — Progress Notes (Signed)
 "  Office Visit Note   Patient: Emily Butler           Date of Birth: 09-26-1955           MRN: [id number] Visit Date: 03/19/2024              Requested by: de Cuba, Raymond J, MD [address] [place],  [place] [address] PCP: de Cuba, Quintin PARAS, MD  Chief Complaint  Patient presents with   Right Ankle - Pain      HPI: Discussed the use of AI scribe software for clinical note transcription with the patient, who gave verbal consent to proceed.  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  She has chronic right ankle pain resulting in severe discomfort and progressive loss of mobility. Pain is exacerbated when Voltaren  is unavailable. She is unable to run or perform high-impact activities but remains able to walk her dogs and manage daily tasks with the aid of Voltaren . Swelling increases with activity, including riding her e-bike or walking to the store.  She received an intra-articular injection in September 2025, which provided partial relief but did not fully resolve her pain. 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 A recent lapse in Voltaren  use led to a marked increase in pain. She is apprehensive about the safety of Voltaren  due to underlying kidney concerns and is uncertain about the efficacy of Voltaren  gel.  She expresses significant concern about losing independence and mobility, as she lives alone and is responsible for caring for her dogs. She is fearful of surgery due to concerns about postoperative recovery, rehabilitation, and the impact on her ability to care for herself and her pets. She has reviewed information regarding recovery and exercises after surgery and is particularly worried about limitations in  walking. She does not smoke, has no available social support for postoperative care, and does not own a car. She remains undecided about the timing of right ankle arthrodesis and wishes to delay surgery until she feels prepared.     Assessment & Plan: Visit Diagnoses:  1. Post-traumatic osteoarthritis, right ankle and foot     Plan: Assessment and Plan Assessment & Plan Primary osteoarthritis of the right ankle Chronic, severe, end-stage osteoarthritis with valgus subluxation and lateral bone-on-bone contact causing significant pain and functional limitation. Subtalar joint remains congruent and pain-free. Previous steroid injections provided partial relief. Topical diclofenac  gel discussed due to renal concerns with oral NSAIDs. - Administered intra-articular injection of the tibial talar joint for symptomatic relief. - Discussed topical diclofenac  gel as an alternative to oral NSAIDs given renal concerns.  Planned right ankle arthrodesis Indicated for severe, malaligned end-stage osteoarthritis with valgus subluxation. Fusion expected to provide durable pain relief and comparable function to replacement. She is apprehensive about surgery due to postoperative mobility concerns, limited social support, and pet care. - Discussed surgical plan for right ankle arthrodesis, to be scheduled when she is prepared. - Reviewed anticipated postoperative course, including non-weightbearing period and rehabilitation requirements. - Advised her to arrange postoperative pet care. - Deferred further intra-articular injections prior to surgery to reduce infection risk.      Follow-Up Instructions: Return if symptoms worsen or fail to improve.   Ortho Exam  Patient is alert, oriented, no adenopathy, well-dressed, normal  affect, normal respiratory effort. Physical Exam CARDIOVASCULAR: Posterior tibial pulse strong. Dorsalis pedis pulse not palpable. MUSCULOSKELETAL: Ankle crepitational range of  motion. Subtalar joint motion good and pain free.      Imaging: No results found. No images are attached to the encounter.  Labs: Lab Results  Component Value Date   HGBA1C 5.4 10/19/2023   HGBA1C 5.5 03/03/2023   HGBA1C 5.3 08/17/2018     Lab Results  Component Value Date   ALBUMIN 4.6 10/19/2023   ALBUMIN 4.7 03/03/2023   ALBUMIN 4.8 04/05/2022    No results found for: MG No results found for: VD25OH  No results found for: PREALBUMIN    Latest Ref Rng & Units 10/19/2023    1:58 PM 03/03/2023   11:57 AM 08/06/2021    4:03 PM  CBC EXTENDED  WBC 3.4 - 10.8 x10E3/uL 6.7  6.2  5.6   RBC 3.77 - 5.28 x10E6/uL 4.45  4.29  4.11   Hemoglobin 11.1 - 15.9 g/dL 85.4  85.6  86.2   HCT 34.0 - 46.6 % 43.1  41.7  39.5   Platelets 150 - 450 x10E3/uL 113  107.0  87.0   NEUT# 1.4 - 7.0 x10E3/uL 4.8  3.8  4.2   Lymph# 0.7 - 3.1 x10E3/uL 1.2  1.7  0.9      There is no height or weight on file to calculate BMI.  Orders:  No orders of the defined types were placed in this encounter.  No orders of the defined types were placed in this encounter.    Procedures: Medium Joint Inj: R ankle on 03/19/2024 3:01 PM Indications: pain and diagnostic evaluation Details: 22 G 1.5 in needle, anteromedial approach Medications: 2 mL lidocaine  1 %; 40 mg methylPREDNISolone  acetate 40 MG/ML Outcome: tolerated well, no immediate complications Procedure, treatment alternatives, risks and benefits explained, specific risks discussed. Consent was given by the patient. Immediately prior to procedure a time out was called to verify the correct patient, procedure, equipment, support staff and site/side marked as required. Patient was prepped and draped in the usual sterile fashion.      Clinical Data: No additional findings.  ROS:  All other systems negative, except as noted in the HPI. Review of Systems  Objective: Vital Signs: There were no vitals taken for this visit.  Specialty  Comments:  No specialty comments available.  PMFS History: Patient Active Problem List   Diagnosis Date Noted   Allergic rhinitis 11/30/2023   At high risk for injury related to fall 10/19/2023   Ankle pain 10/19/2023   History of hepatitis C 03/03/2023   History of alcohol abuse 03/03/2023   Hyperglycemia 03/03/2023   Other chronic pain 03/03/2023   DOE (dyspnea on exertion) 03/03/2023   Gastrointestinal food sensitivity 03/03/2023   High risk medication use 05/27/2022   Hypersomnia 05/27/2022   Arthralgia of multiple sites 02/03/2021   Hyperlipidemia 08/21/2019   Alcohol use disorder, moderate, in sustained remission (HCC) 12/11/2018   Current severe episode of major depressive disorder without psychotic features (HCC) 12/07/2018   Anemia due to chronic kidney disease 11/06/2014   Chronic kidney disease, unspecified 08/13/2013   Gastroesophageal reflux disease without esophagitis 08/13/2013   Restless legs syndrome (RLS) 05/23/2013   Umbilical hernia 10/31/2012   Atrial fibrillation (HCC) 07/07/2012   Hepatic cirrhosis (HCC) 07/07/2012   Former smoker 07/07/2012   Essential hypertension 07/07/2012   THROMBOCYTOPENIA 12/20/2006   Past Medical History:  Diagnosis Date   Alcoholic (HCC)  Cirrhosis of liver (HCC)    Depression    EXCESSIVE MENSTRUATION 11/08/2006   Qualifier: Diagnosis of  By: Baird FNP, Frieda Kipper    GERD (gastroesophageal reflux disease)    Hepatitis C 2011   Hypertension     Family History  Problem Relation Age of Onset   Alcohol abuse Mother    Drug abuse Mother    Heart disease Father     Past Surgical History:  Procedure Laterality Date   CESAREAN SECTION     Social History   Occupational History   Occupation: disability  Tobacco Use   Smoking status: Former    Current packs/day: 0.00    Average packs/day: 1 pack/day for 33.0 years (33.0 ttl pk-yrs)    Types: Cigarettes    Start date: 40    Quit date: 2005    Years since  quitting: 21.0    Passive exposure: Past   Smokeless tobacco: Never  Vaping Use   Vaping status: Never Used  Substance and Sexual Activity   Alcohol use: Not Currently    Comment: Alcoholic quit 10 years ago   Drug use: Not Currently    Types: Cocaine   Sexual activity: Not Currently         "

## 2024-04-01 ENCOUNTER — Encounter (HOSPITAL_BASED_OUTPATIENT_CLINIC_OR_DEPARTMENT_OTHER): Payer: Self-pay | Admitting: *Deleted

## 2024-04-02 ENCOUNTER — Telehealth (INDEPENDENT_AMBULATORY_CARE_PROVIDER_SITE_OTHER): Admitting: Family Medicine

## 2024-04-02 ENCOUNTER — Encounter (HOSPITAL_BASED_OUTPATIENT_CLINIC_OR_DEPARTMENT_OTHER): Payer: Self-pay | Admitting: Family Medicine

## 2024-04-02 VITALS — Ht 63.0 in | Wt 179.0 lb

## 2024-04-02 DIAGNOSIS — I4891 Unspecified atrial fibrillation: Secondary | ICD-10-CM

## 2024-04-02 DIAGNOSIS — F322 Major depressive disorder, single episode, severe without psychotic features: Secondary | ICD-10-CM

## 2024-04-02 DIAGNOSIS — I10 Essential (primary) hypertension: Secondary | ICD-10-CM

## 2024-04-02 DIAGNOSIS — K7030 Alcoholic cirrhosis of liver without ascites: Secondary | ICD-10-CM | POA: Diagnosis not present

## 2024-04-02 NOTE — Assessment & Plan Note (Signed)
 Doing well with sertraline . Has noted improvement in symptoms since starting back on medication. Feels that symptoms are well-controlled with current dose of sertraline . Can continue with current dose, no changes today.

## 2024-04-02 NOTE — Assessment & Plan Note (Signed)
 Previously referred to GI locally.  Was following with [facility] hepatologist in the past.  Still needs to contact GI to schedule this appointment.  She may also need to establish with hepatologist as well given history.

## 2024-04-02 NOTE — Assessment & Plan Note (Signed)
 Blood pressure appropriate in office at last visit.  No concerns at this time, no recent blood pressure checks at home, no current concerns with chest pain, headaches or vision changes.  Can continue with propranolol , and can continue with holding losartan  given appropriate blood pressure.  Consider resuming losartan  if blood pressure is elevated in the future

## 2024-04-02 NOTE — Assessment & Plan Note (Signed)
 Noted on history.  Patient referred previously, has not scheduled yet - did have appointment scheduled in November, however had to cancel appointment at that time.  I do still feel that patient would benefit from evaluation with cardiology given history.  Patient has contact information for cardiology office, recommend contacting them to schedule appointment

## 2024-05-01 ENCOUNTER — Ambulatory Visit (HOSPITAL_BASED_OUTPATIENT_CLINIC_OR_DEPARTMENT_OTHER)

## 2024-08-06 ENCOUNTER — Ambulatory Visit (HOSPITAL_BASED_OUTPATIENT_CLINIC_OR_DEPARTMENT_OTHER): Admitting: Family Medicine
